# Patient Record
Sex: Female | Born: 1955 | Race: White | Hispanic: No | Marital: Married | Smoking: Former smoker
Health system: Southern US, Community
[De-identification: ages and names within clinical notes are randomized; demographics above are authoritative.]

## PROBLEM LIST (undated history)

## (undated) DIAGNOSIS — R05 Cough: Secondary | ICD-10-CM

## (undated) DIAGNOSIS — Z9889 Other specified postprocedural states: Secondary | ICD-10-CM

## (undated) DIAGNOSIS — Z8673 Personal history of transient ischemic attack (TIA), and cerebral infarction without residual deficits: Secondary | ICD-10-CM

## (undated) DIAGNOSIS — I639 Cerebral infarction, unspecified: Secondary | ICD-10-CM

## (undated) DIAGNOSIS — F40240 Claustrophobia: Secondary | ICD-10-CM

## (undated) DIAGNOSIS — Q211 Atrial septal defect, unspecified: Secondary | ICD-10-CM

## (undated) DIAGNOSIS — J302 Other seasonal allergic rhinitis: Secondary | ICD-10-CM

## (undated) DIAGNOSIS — F909 Attention-deficit hyperactivity disorder, unspecified type: Secondary | ICD-10-CM

## (undated) DIAGNOSIS — G43909 Migraine, unspecified, not intractable, without status migrainosus: Secondary | ICD-10-CM

## (undated) DIAGNOSIS — J45909 Unspecified asthma, uncomplicated: Secondary | ICD-10-CM

## (undated) DIAGNOSIS — J3089 Other allergic rhinitis: Secondary | ICD-10-CM

## (undated) DIAGNOSIS — Z79890 Hormone replacement therapy (postmenopausal): Secondary | ICD-10-CM

## (undated) DIAGNOSIS — F418 Other specified anxiety disorders: Secondary | ICD-10-CM

## (undated) DIAGNOSIS — E663 Overweight: Secondary | ICD-10-CM

## (undated) DIAGNOSIS — J449 Chronic obstructive pulmonary disease, unspecified: Secondary | ICD-10-CM

## (undated) DIAGNOSIS — Z87891 Personal history of nicotine dependence: Secondary | ICD-10-CM

## (undated) DIAGNOSIS — J209 Acute bronchitis, unspecified: Secondary | ICD-10-CM

## (undated) DIAGNOSIS — R112 Nausea with vomiting, unspecified: Secondary | ICD-10-CM

## (undated) DIAGNOSIS — G4489 Other headache syndrome: Secondary | ICD-10-CM

## (undated) DIAGNOSIS — R269 Unspecified abnormalities of gait and mobility: Secondary | ICD-10-CM

## (undated) DIAGNOSIS — J069 Acute upper respiratory infection, unspecified: Secondary | ICD-10-CM

## (undated) DIAGNOSIS — I69398 Other sequelae of cerebral infarction: Secondary | ICD-10-CM

## (undated) DIAGNOSIS — Z87440 Personal history of urinary (tract) infections: Secondary | ICD-10-CM

## (undated) DIAGNOSIS — J440 Chronic obstructive pulmonary disease with acute lower respiratory infection: Secondary | ICD-10-CM

## (undated) DIAGNOSIS — R200 Anesthesia of skin: Secondary | ICD-10-CM

## (undated) DIAGNOSIS — J0100 Acute maxillary sinusitis, unspecified: Secondary | ICD-10-CM

## (undated) DIAGNOSIS — R5383 Other fatigue: Secondary | ICD-10-CM

## (undated) DIAGNOSIS — G43109 Migraine with aura, not intractable, without status migrainosus: Secondary | ICD-10-CM

## (undated) DIAGNOSIS — G2581 Restless legs syndrome: Secondary | ICD-10-CM

## (undated) HISTORY — PX: ABDOMINAL HYSTERECTOMY: SHX81

## (undated) HISTORY — DX: Cerebral infarction, unspecified: I63.9

## (undated) HISTORY — DX: Overweight: E66.3

## (undated) HISTORY — DX: Unspecified abnormalities of gait and mobility: R26.9

## (undated) HISTORY — DX: Acute bronchitis, unspecified: J20.9

## (undated) HISTORY — DX: Other specified anxiety disorders: F41.8

## (undated) HISTORY — DX: Chronic obstructive pulmonary disease, unspecified: J44.9

## (undated) HISTORY — PX: PARTIAL HYSTERECTOMY: SHX80

## (undated) HISTORY — DX: Cough: R05

## (undated) HISTORY — PX: TUBAL LIGATION: SHX77

## (undated) HISTORY — DX: Anesthesia of skin: R20.0

## (undated) HISTORY — DX: Migraine with aura, not intractable, without status migrainosus: G43.109

## (undated) HISTORY — DX: Other fatigue: R53.83

## (undated) HISTORY — DX: Other seasonal allergic rhinitis: J30.2

## (undated) HISTORY — DX: Other allergic rhinitis: J30.89

## (undated) HISTORY — DX: Other sequelae of cerebral infarction: I69.398

## (undated) HISTORY — PX: ASD REPAIR: SHX258

## (undated) HISTORY — DX: Personal history of transient ischemic attack (TIA), and cerebral infarction without residual deficits: Z86.73

## (undated) HISTORY — DX: Atrial septal defect: Q21.1

## (undated) HISTORY — DX: Personal history of nicotine dependence: Z87.891

## (undated) HISTORY — DX: Other headache syndrome: G44.89

## (undated) HISTORY — DX: Atrial septal defect, unspecified: Q21.10

## (undated) HISTORY — DX: Acute upper respiratory infection, unspecified: J06.9

## (undated) HISTORY — DX: Unspecified asthma, uncomplicated: J45.909

## (undated) HISTORY — PX: OTHER SURGICAL HISTORY: SHX169

## (undated) HISTORY — DX: Acute maxillary sinusitis, unspecified: J01.00

## (undated) HISTORY — DX: Chronic obstructive pulmonary disease with (acute) lower respiratory infection: J44.0

---

## 1997-10-01 ENCOUNTER — Ambulatory Visit (HOSPITAL_COMMUNITY): Admission: RE | Admit: 1997-10-01 | Discharge: 1997-10-01 | Payer: Self-pay | Admitting: Internal Medicine

## 1998-01-17 ENCOUNTER — Encounter: Payer: Self-pay | Admitting: Family Medicine

## 1998-01-17 ENCOUNTER — Ambulatory Visit (HOSPITAL_COMMUNITY): Admission: RE | Admit: 1998-01-17 | Discharge: 1998-01-17 | Payer: Self-pay | Admitting: Family Medicine

## 1999-01-29 ENCOUNTER — Encounter: Admission: RE | Admit: 1999-01-29 | Discharge: 1999-01-29 | Payer: Self-pay | Admitting: Family Medicine

## 1999-01-29 ENCOUNTER — Encounter: Payer: Self-pay | Admitting: Family Medicine

## 1999-02-05 ENCOUNTER — Encounter: Payer: Self-pay | Admitting: Family Medicine

## 1999-02-05 ENCOUNTER — Encounter: Admission: RE | Admit: 1999-02-05 | Discharge: 1999-02-05 | Payer: Self-pay | Admitting: Family Medicine

## 1999-04-02 HISTORY — PX: BLADDER SUSPENSION: SHX72

## 1999-05-02 ENCOUNTER — Encounter: Payer: Self-pay | Admitting: Family Medicine

## 1999-05-02 ENCOUNTER — Encounter: Admission: RE | Admit: 1999-05-02 | Discharge: 1999-05-02 | Payer: Self-pay | Admitting: Family Medicine

## 1999-09-11 ENCOUNTER — Encounter: Payer: Self-pay | Admitting: *Deleted

## 1999-09-11 ENCOUNTER — Encounter: Admission: RE | Admit: 1999-09-11 | Discharge: 1999-09-11 | Payer: Self-pay | Admitting: *Deleted

## 1999-11-22 ENCOUNTER — Encounter (INDEPENDENT_AMBULATORY_CARE_PROVIDER_SITE_OTHER): Payer: Self-pay | Admitting: Specialist

## 1999-11-22 ENCOUNTER — Inpatient Hospital Stay (HOSPITAL_COMMUNITY): Admission: RE | Admit: 1999-11-22 | Discharge: 1999-11-23 | Payer: Self-pay | Admitting: Urology

## 2002-03-19 ENCOUNTER — Encounter: Payer: Self-pay | Admitting: Family Medicine

## 2002-03-19 ENCOUNTER — Encounter: Admission: RE | Admit: 2002-03-19 | Discharge: 2002-03-19 | Payer: Self-pay | Admitting: Family Medicine

## 2002-04-03 ENCOUNTER — Encounter: Payer: Self-pay | Admitting: Emergency Medicine

## 2002-04-03 ENCOUNTER — Emergency Department (HOSPITAL_COMMUNITY): Admission: EM | Admit: 2002-04-03 | Discharge: 2002-04-03 | Payer: Self-pay

## 2003-03-02 ENCOUNTER — Ambulatory Visit (HOSPITAL_COMMUNITY): Admission: RE | Admit: 2003-03-02 | Discharge: 2003-03-02 | Payer: Self-pay | Admitting: Family Medicine

## 2003-08-02 ENCOUNTER — Emergency Department (HOSPITAL_COMMUNITY): Admission: EM | Admit: 2003-08-02 | Discharge: 2003-08-02 | Payer: Self-pay | Admitting: Family Medicine

## 2003-08-02 ENCOUNTER — Emergency Department (HOSPITAL_COMMUNITY): Admission: EM | Admit: 2003-08-02 | Discharge: 2003-08-02 | Payer: Self-pay | Admitting: Emergency Medicine

## 2003-11-09 ENCOUNTER — Emergency Department (HOSPITAL_COMMUNITY): Admission: EM | Admit: 2003-11-09 | Discharge: 2003-11-09 | Payer: Self-pay | Admitting: *Deleted

## 2004-07-16 ENCOUNTER — Emergency Department (HOSPITAL_COMMUNITY): Admission: EM | Admit: 2004-07-16 | Discharge: 2004-07-17 | Payer: Self-pay | Admitting: Emergency Medicine

## 2004-07-18 ENCOUNTER — Ambulatory Visit (HOSPITAL_COMMUNITY): Admission: RE | Admit: 2004-07-18 | Discharge: 2004-07-18 | Payer: Self-pay | Admitting: Family Medicine

## 2004-07-25 ENCOUNTER — Ambulatory Visit (HOSPITAL_COMMUNITY): Admission: RE | Admit: 2004-07-25 | Discharge: 2004-07-25 | Payer: Self-pay | Admitting: Otolaryngology

## 2004-08-06 ENCOUNTER — Encounter: Admission: RE | Admit: 2004-08-06 | Discharge: 2004-08-06 | Payer: Self-pay | Admitting: Neurological Surgery

## 2006-07-28 ENCOUNTER — Ambulatory Visit (HOSPITAL_COMMUNITY): Admission: RE | Admit: 2006-07-28 | Discharge: 2006-07-28 | Payer: Self-pay | Admitting: Obstetrics and Gynecology

## 2007-07-27 ENCOUNTER — Ambulatory Visit (HOSPITAL_COMMUNITY): Admission: RE | Admit: 2007-07-27 | Discharge: 2007-07-27 | Payer: Self-pay | Admitting: Obstetrics and Gynecology

## 2008-08-08 ENCOUNTER — Ambulatory Visit (HOSPITAL_COMMUNITY): Admission: RE | Admit: 2008-08-08 | Discharge: 2008-08-08 | Payer: Self-pay | Admitting: Obstetrics and Gynecology

## 2009-02-27 ENCOUNTER — Ambulatory Visit: Payer: Self-pay | Admitting: Internal Medicine

## 2009-02-27 DIAGNOSIS — J3089 Other allergic rhinitis: Secondary | ICD-10-CM

## 2009-02-27 DIAGNOSIS — J302 Other seasonal allergic rhinitis: Secondary | ICD-10-CM

## 2009-02-27 DIAGNOSIS — J45909 Unspecified asthma, uncomplicated: Secondary | ICD-10-CM

## 2009-02-27 HISTORY — DX: Other seasonal allergic rhinitis: J30.2

## 2009-02-27 HISTORY — DX: Unspecified asthma, uncomplicated: J45.909

## 2009-03-13 ENCOUNTER — Ambulatory Visit: Payer: Self-pay | Admitting: Internal Medicine

## 2009-03-13 DIAGNOSIS — J069 Acute upper respiratory infection, unspecified: Secondary | ICD-10-CM | POA: Insufficient documentation

## 2009-03-13 HISTORY — DX: Acute upper respiratory infection, unspecified: J06.9

## 2009-03-20 ENCOUNTER — Ambulatory Visit: Payer: Self-pay | Admitting: Internal Medicine

## 2009-03-27 ENCOUNTER — Ambulatory Visit: Payer: Self-pay | Admitting: Internal Medicine

## 2009-03-27 DIAGNOSIS — J440 Chronic obstructive pulmonary disease with acute lower respiratory infection: Secondary | ICD-10-CM

## 2009-03-27 DIAGNOSIS — J209 Acute bronchitis, unspecified: Secondary | ICD-10-CM

## 2009-03-27 HISTORY — DX: Acute bronchitis, unspecified: J20.9

## 2009-03-27 HISTORY — DX: Acute bronchitis, unspecified: J44.0

## 2009-05-09 ENCOUNTER — Encounter: Payer: Self-pay | Admitting: Internal Medicine

## 2009-05-09 ENCOUNTER — Telehealth: Payer: Self-pay | Admitting: Internal Medicine

## 2009-05-16 ENCOUNTER — Encounter: Payer: Self-pay | Admitting: Internal Medicine

## 2009-05-17 ENCOUNTER — Telehealth: Payer: Self-pay | Admitting: Internal Medicine

## 2009-05-19 ENCOUNTER — Encounter: Payer: Self-pay | Admitting: Internal Medicine

## 2009-09-11 ENCOUNTER — Ambulatory Visit (HOSPITAL_COMMUNITY): Admission: RE | Admit: 2009-09-11 | Discharge: 2009-09-11 | Payer: Self-pay | Admitting: Obstetrics and Gynecology

## 2010-03-01 ENCOUNTER — Ambulatory Visit (HOSPITAL_COMMUNITY)
Admission: RE | Admit: 2010-03-01 | Discharge: 2010-03-01 | Payer: Self-pay | Source: Home / Self Care | Admitting: Obstetrics and Gynecology

## 2010-05-01 NOTE — Medication Information (Signed)
Summary: Tax adviser   Imported By: Lehman Prom 05/19/2009 16:23:09  _____________________________________________________________________  External Attachment:    Type:   Image     Comment:   External Document

## 2010-05-01 NOTE — Medication Information (Signed)
[facility]   Imported By: Lester [place] 05/15/2009 07:25:03  _____________________________________________________________________  External Attachment:    Type:   Image     Comment:   External Document

## 2010-05-01 NOTE — Progress Notes (Signed)
Summary: prescriptions  Phone Note Call from Patient Call back at Home Phone [phone]   Caller: Patient Call For: young Summary of Call: need rx for an inhaler, also needs a substitute for singulair, insurance refuses to fill it.//[facility] [address] Initial call taken by: Darletta Moll,  May 17, 2009 3:56 PM  Follow-up for Phone Call        advised pt that she needed to call insurance to see what would be covered instead of singulair and to call back and let us know. She is requesting rx for proventil she was given a sample at last ov. Rx sent. pt aware. Carron Curie CMA  May 17, 2009 4:16 PM     New/Updated Medications: PROVENTIL HFA 108 (90 BASE) MCG/ACT AERS (ALBUTEROL SULFATE) 2 puffs up to 4 times daily as needed for SOB and wheezing Prescriptions: PROVENTIL HFA 108 (90 BASE) MCG/ACT AERS (ALBUTEROL SULFATE) 2 puffs up to 4 times daily as needed for SOB and wheezing  #1 x 3   Entered by:   Carron Curie CMA   Authorized by:   Waymon Budge MD   Signed by:   Carron Curie CMA on 05/17/2009   Method used:   [facility]        [facility]  [phone][facility] (retail)       [address]       [place], [place]  [id number]       Ph: [phone] or [phone]       Fax: [phone]   RxID:   [id number]

## 2010-05-01 NOTE — Medication Information (Signed)
Summary: [facility] Singulair[facility]  [facility]   Imported By: Sherian Rein 05/19/2009 10:27:55  _____________________________________________________________________  External Attachment:    Type:   Image     Comment:   External Document

## 2010-05-01 NOTE — Progress Notes (Signed)
Summary: refill hydromet cough syrup  Prescriptions: HYDROMET 5-1.5 MG/5ML SYRP (HYDROCODONE-HOMATROPINE) 1 teaspoon four times a day as needed  #150 ml x 0   Entered and Authorized by:   Waymon Budge MD   Signed by:   Waymon Budge MD on 05/09/2009   Method used:   Historical   RxID:   [id number]

## 2010-06-12 LAB — CBC
HCT: 39.3 % (ref 36.0–46.0)
Hemoglobin: 13.1 g/dL (ref 12.0–15.0)
MCH: 31.6 pg (ref 26.0–34.0)
MCHC: 33.4 g/dL (ref 30.0–36.0)
MCV: 94.6 fL (ref 78.0–100.0)
Platelets: 263 10*3/uL (ref 150–400)
RBC: 4.15 MIL/uL (ref 3.87–5.11)
RDW: 13.5 % (ref 11.5–15.5)
WBC: 10.3 10*3/uL (ref 4.0–10.5)

## 2010-06-12 LAB — SURGICAL PCR SCREEN
MRSA, PCR: NEGATIVE
Staphylococcus aureus: NEGATIVE
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
## 2010-08-17 NOTE — Op Note (Signed)
[facility]  Patient:    [name], [name]                     MRN: [id number] Proc. Date: 11/22/99 Adm. Date:  [id number] Disc. Date: [id number] Attending:  Monica Becton                           Operative Report  PREOPERATIVE DIAGNOSIS:  Stress urinary incontinence.  POSTOPERATIVE DIAGNOSIS:  Stress urinary incontinence.  OPERATION:  Pubovaginal sling.  Cystoscopy and insertion of a Microvasive suprapubic catheter.  SURGEON:  Claudette Laws, M.D.  DESCRIPTION OF PROCEDURE:  This patient underwent initiation of a cystocele repair and rectocele repair by Dr. Tracey Harries.  I assisted him and then entered the operative field and put in a pubovaginal sling.  Initially we broke though the urethropelvic ligament which was quite scarred.  There was a scar in recto-symphysis, recto-pubis.  With Metzenbaum scissors and blunt dissection we were able to break through, free up the bladder neck bilaterally.  We then made an incision right over the pubic bone area about 2 cm in length, dissected our way down to the rectus fascia.  Then using a Raz needle, this was passed down behind the pubic bone under fingertip control and brought out through the urethropelvic ligament.  A sling was created using [employer] donor fascia lata graft.  This was a 2.5 cm by 9.5 cm graft. It was secured on each end with a running 0 Prolene stitch.  The ends of the Prolene suture were passed through the eye of the Raz needle and brought out suprapubically bilaterally.  The sling was then situated right over the bladder neck area using the Foley balloon as the guide.  I did tack the top of the sling with a 3-0 Vicryl stitch right in the midline.  Then a cystoscopy was performed.  There was no inadvertent puncture of the bladder.  Orifices were normal.  Indigo carmine had been injected and could be seen effluxing bilaterally.  With the bladder full of irrigating solution and the  patient in Trendelenburg position, a Microvasive suprapubic catheter was placed under direct vision, percutaneously was secured to the skin with 3-0 nylon. Dr. Ambrose Mantle then completed the cystocele repair.  I tied down the suture.  We inserted a cystoscope, deflected it to approximately 30 degrees, so as to make sure the sling was not to tight.  I then tied the Prolene sutures to themselves and across the midline, again, with a finger beneath the knot to make sure we did not make to the sling to tight.  At the end, with the bladder full of solution we could express water from the bladder with suprapubic pressure.  At this point I felt that the sling was adequately placed and not to tight.  The wound was irrigated with copious amounts of bug juice.  Dr. Ambrose Mantle finished the repair and then I skin stapled the incision with skin staples.  Vaginal packing was placed and the patient was taken back to the recovery room in satisfactory condition.  The estimated blood loss was 325 cc. D:  11/22/99 TD:  11/24/99 Job: [id number]

## 2010-08-17 NOTE — H&P (Signed)
[facility]  Patient:    [name]                         MRN: [id number] Adm. Date:  11/22/99 Attending:  Malachi Pro. Ambrose Mantle, M.D.                         History and Physical  HISTORY OF PRESENT ILLNESS:  This is a 55 year old white married female para 3-0-1-3 who was admitted to the hospital for anterior and posterior vaginal repair and a sling procedure.  The A&P repair will be done by me and the sling procedure by Dr. Etta Grandchild.  This patient had a hysterectomy in 1983.  It was done vaginally for bleeding problems.  The patient states that she has had stress urinary incontinence with cough and sneeze and requires three to four pads per day.  She also feels pressure in her vagina, sometimes tissue gets in the way with intercourse. She has stress urinary incontinence and also urgency incontinence, and has been evaluated by Dr. Etta Grandchild and he feels she will improve with a urethral suspension procedure.  She does have a significant rectocele and also a smaller cystocele and repair of those will be done at the same time.  PAST MEDICAL HISTORY:  ALLERGIES:  PENICILLIN and CODEINE.  OPERATIONS:  Hysterectomy, tubal ligation, drainage of a rectovaginal septum hematoma caused by an injury on an emergency break.  ILLNESSES:  The patient has bronchitis and has also had pneumonia.  She has an anxiety disorder and is treated with Xanax 0.5 mg once or twice a day.  She has no known heart problems.  She has also had a diagnostic laparoscopy.  HABITS:  She does not drink, smokes 1 to 1-1/2 packs of cigarettes daily.  REVIEW OF SYSTEMS:  Noncontributory, except as in the present illness, and occasional migraine headache, plus a chronic cough.  FAMILY HISTORY:  Mother 68 with heart disease.  Father died at 59 of heart problems.  One half sister is living and well.  Four brothers are living, one has diabetes.  SOCIAL HISTORY:  The patient did go to ______ [employer].  She finished [employer], and has worked as a Associate Professor.  PHYSICAL EXAMINATION:  GENERAL:  Weight 170-1/4 pounds white female in no distress.  VITAL SIGNS:  Blood pressure 120/60, pulse 80.  HEENT:  Reveals no cranial abnormalities.  Extraocular movements intact.  Nose and pharynx are clear.  There are absent molars, upper and lower.  NECK:  Supple without thyromegaly.  HEART:  Normal size and sounds.  No murmurs.  LUNGS:  Clear to P&A.  BREASTS:  Soft, without masses.  ABDOMEN:  Soft and nontender.  There are no masses palpable.  Liver, spleen and kidney are not felt.  VAGINAL:  Pap smear in July 2001 was normal.  The vulva and vagina are clean. There is a third-degree rectocele and a second-degree cystocele.  The cuff is clean.  Adnexa are clear.  RECTAL:  Exam done in July 2001 and again on November 21, 1999, showed no masses, does confirm the fairly large low rectocele.  ADMITTING IMPRESSION:  Cystocele, rectocele, stress urinary incontinence.  PLAN:  The patient is admitted for anterior and posterior repair by me and sling procedure by Dr. Etta Grandchild.  The patient understands the risks of surgery and is prepared to proceed.  She has been informed of the potential for cardiac  problems, stroke, pulmonary emboli, intestinal obstruction, fistula formation, nerve injury, hemorrhage with need for re-operation and/or transfusion.  She understands and agrees to proceed. DD:  11/21/99 TD:  11/22/99 Job: [id number]

## 2010-08-17 NOTE — Op Note (Signed)
[facility]  Patient:    SUMEDHA, MUNNERLYN                     MRN: [id number] Proc. Date: 11/22/99 Adm. Date:  [id number] Disc. Date: [id number] Attending:  Monica Becton CC:         Claudette Laws, M.D.   Operative Report  PREOPERATIVE DIAGNOSES:  Cystocele, rectocele, and stress urinary incontinence.  POSTOPERATIVE DIAGNOSES:  Cystocele, rectocele, and stress urinary incontinence.  OPERATION:  Anterior and posterior vaginal repair, sling procedure.  The anterior and posterior repair done by Dr. Ambrose Mantle with the sling procedure done by Dr. Etta Grandchild.  SURGEONS:  Malachi Pro. Ambrose Mantle, M.D. and Claudette Laws, M.D.  ANESTHESIA:  General.  DESCRIPTION OF PROCEDURE:  The patient was brought to the operating room.  The abdomen, vulva, vagina, and urethra were prepped with Betadine solution.  A Foley catheter was inserted to straight drain.  The area was draped as a sterile field.  The vaginal cuff was identified.  The portion just above the cuff was cut across.  The anterior vaginal mucosa was separated from the cystocele all the way to the urethral meatus.  About 1 cm from the urethral meatus a cystocele was developed and the cystocele was obliterated with multiple interrupted sutures of 0 Vicryl.  The redundant vaginal mucosa was not cut away.  I then cut across the posterior vaginal fourchette, undermined the vaginal mucosa almost to the cuff, developed the rectocele, obliterated the rectocele with multiple interrupted sutures of 0 Vicryl.  The vaginal mucosa was cut away and then the vagina was reunited with multiple figure-of-eight sutures of 0 Vicryl and on the perineum 3-0 Vicryl.  Two rectal examinations were done to ensure that there was no suture placed in the rectum, and there was no injury to the rectum.  At this point, Dr. Etta Grandchild took over and performed the sling procedure.  After this was completed, I cut away redundant vaginal mucosa  anteriorly and reunited the vaginal mucosa in the midline with multiple interrupted figure-of-eight sutures without 0 Vicryl. There was a slight constriction of the vagina to two fingers at the very top of the vagina, but this was considered acceptable.  There was no excessive bleeding.  A 2 inch iodoform pack was placed at the end of the procedure, and the procedure was terminated.  Blood loss was estimated at 325 cc. DD:  11/22/99 TD:  11/24/99 Job: [id number]

## 2010-08-17 NOTE — Discharge Summary (Signed)
[facility]  Patient:    [name], [name]                     MRN: [id number] Adm. Date:  [id number] Disc. Date: [id number] Attending:  Monica Becton CC:         Malachi Pro. Ambrose Mantle, M.D.   Discharge Summary  HISTORY:  This is a 55 year old married lady with three children who has had progressive symptoms of stress urinary incontinence.  She was worked up in the office and found to have significant cystorectocele along with positive stress test.  Initially we tried conservative measures with exercises, drugs, and bladder antispasmodics, but she did not improve much, and we felt the best option would be surgery.  She had seen Dr. Hilbert Bible in the past, and he had evaluated her preop and felt that we should fix the rather significant cystorectocele at the time of pubovaginal sling procedure.  We explained the nature of the procedure to the patient including complications such as postop inability to urinate, failure of the sling to correct the leakage, postop wound infection, bleeding, hematoma.  She understands and agrees to the proposed surgery.  The patient basically is in good health.  She is a smoker, has chronic bronchitis, history of migraine headaches.  PERTINENT LABORATORY DATA:  Her electrolytes were normal with a BUN of 10, creatinine 1.0.  PT and PTT were normal.  Hemoglobin 14.4, hematocrit 43.0.  Chest x-ray was unremarkable with no active disease.  EKG was normal.  COURSE IN THE HOSPITAL:  The patient came in as an a.m. admission on November 22, 1999, underwent pubovaginal sling procedure along with anterior posterior repair.  Postop, she was left with some vaginal packing, Foley catheter, and a microinvasive suprapubic catheter.  She had a normal stable first postop night, and then the next morning I saw her.  She was doing well. The catheters were draining satisfactorily.  We removed the vaginal packing along with Foley catheter, and  she was sent home with the suprapubic catheter to a leg bag and instructed how to clamp and unclamp it and was sent home with a voiding diary to measure her postvoid residuals.  FOLLOWUP:  If she has any problems, she will let me know, otherwise return in one week for followup evaluation and removal of skin staples.  FINAL DIAGNOSES: 1. Severe stress urinary incontinence. 2. Cystocele and rectocele. 3. Remote history of a hysterectomy. 4. History of bronchitis. 5. History of migraine headaches.  OPERATION:  Pubovaginal sling procedure and anterior posterior repair.  COMPLICATIONS:  None.  CONDITION ON DISCHARGE:  Stable.  DISCHARGE MEDICATIONS:  To include all of her home medicines which include an inhaler and Xanax plus Cipro 250 mg b.i.d., #14, and Tylox, #25, p.r.n. pain.  DISPOSITION:  Regular diet.  Force fluids.  Limited activity.  She is to return to the office in one week for followup. DD:  11/23/99 TD:  11/24/99 Job: [id number]

## 2010-08-23 ENCOUNTER — Other Ambulatory Visit (HOSPITAL_COMMUNITY): Payer: Self-pay | Admitting: Obstetrics and Gynecology

## 2010-08-23 DIAGNOSIS — Z1231 Encounter for screening mammogram for malignant neoplasm of breast: Secondary | ICD-10-CM

## 2010-09-17 ENCOUNTER — Ambulatory Visit (HOSPITAL_COMMUNITY)
Admission: RE | Admit: 2010-09-17 | Discharge: 2010-09-17 | Disposition: A | Discharge status: Home / Self Care | Payer: BC Managed Care – PPO | Source: Ambulatory Visit | Attending: Obstetrics and Gynecology | Admitting: Obstetrics and Gynecology

## 2010-09-17 DIAGNOSIS — Z1231 Encounter for screening mammogram for malignant neoplasm of breast: Secondary | ICD-10-CM | POA: Insufficient documentation

## 2011-02-25 ENCOUNTER — Other Ambulatory Visit: Payer: Self-pay | Admitting: Obstetrics and Gynecology

## 2011-07-11 ENCOUNTER — Encounter: Payer: Self-pay | Admitting: Pulmonary Disease

## 2011-07-12 ENCOUNTER — Encounter: Payer: Self-pay | Admitting: Internal Medicine

## 2011-07-12 ENCOUNTER — Telehealth: Payer: Self-pay | Admitting: Internal Medicine

## 2011-07-12 ENCOUNTER — Ambulatory Visit (INDEPENDENT_AMBULATORY_CARE_PROVIDER_SITE_OTHER)
Admission: RE | Admit: 2011-07-12 | Discharge: 2011-07-12 | Disposition: A | Discharge status: Home / Self Care | Payer: BC Managed Care – PPO | Source: Ambulatory Visit | Attending: Internal Medicine | Admitting: Internal Medicine

## 2011-07-12 ENCOUNTER — Ambulatory Visit (INDEPENDENT_AMBULATORY_CARE_PROVIDER_SITE_OTHER): Payer: BC Managed Care – PPO | Admitting: Internal Medicine

## 2011-07-12 VITALS — BP 140/92 | HR 78 | Ht 66.0 in | Wt 182.0 lb

## 2011-07-12 DIAGNOSIS — J209 Acute bronchitis, unspecified: Secondary | ICD-10-CM

## 2011-07-12 DIAGNOSIS — J440 Chronic obstructive pulmonary disease with acute lower respiratory infection: Secondary | ICD-10-CM

## 2011-07-12 NOTE — Progress Notes (Signed)
07/12/11- 49 yoF former smoker with history of allergic rhinitis and asthma/COPD.  Husband here. LOV 03/28/11 c/o chest congestion, chest tightness/pressure, increased SOB, some wheezing qhs, prod cough with yellow mucusx 5-6 wks. Denies f/c/s now.  Caught cold 5 or 6 weeks ago with fever. Was treated with Z-Paks twice and a course of prednisone. 2 weeks ago saw Dr. Edd Fabian and treated with Cleocin. Still bothersome postnasal drainage without blowing or headache. Chest tight with some wheeze and shortness of breath. Substernal aching. Cough is becoming less productive but has had yellow to green mucus. No blood or swollen glands. Using Ventolin inhaler with little benefit. Dropped off of Symbicort.  ROS-see HPI Constitutional:   No-   weight loss, night sweats, fevers, chills, fatigue, lassitude. HEENT:   No-  headaches, difficulty swallowing, tooth/dental problems, sore throat,       No-  sneezing, itching, ear ache,  +nasal congestion, post nasal drip,  CV:  No-   chest pain, orthopnea, PND, swelling in lower extremities, anasarca, dizziness, palpitations Resp: +  shortness of breath with exertion or at rest.              +   productive cough,  No non-productive cough,  No- coughing up of blood.              +  change in color of mucus.  + wheezing.   Skin: No-   rash or lesions. GI:  No-   heartburn, indigestion, abdominal pain, nausea, vomiting, GU:  MS:  No-   joint pain or swelling.   Neuro-     nothing unusual Psych:  No- change in mood or affect. No depression or anxiety.  No memory loss.  OBJ- Physical Exam General- Alert, Oriented, Affect-appropriate, Distress- none acute Skin- rash-none, lesions- none, excoriation- none Lymphadenopathy- none Head- atraumatic            Eyes- Gross vision intact, PERRLA, conjunctivae and secretions clear            Ears- Hearing, canals-normal            Nose- Clear, no-Septal dev, mucus, polyps, erosion, perforation             Throat-  Mallampati II , mucosa cobblestoned , drainage- none, tonsils- atrophic Neck- flexible , trachea midline, no stridor , thyroid nl, carotid no bruit Chest - symmetrical excursion , unlabored           Heart/CV- RRR , no murmur , no gallop  , no rub, nl s1 s2                           - JVD- none , edema- none, stasis changes- none, varices- none           Lung- clear to P&A, wheeze- none, cough- none , dullness-none, rub- none           Chest wall-  Abd-  Br/ Gen/ Rectal- Not done, not indicated Extrem- cyanosis- none, clubbing, none, atrophy- none, strength- nl Neuro- grossly intact to observation

## 2011-07-12 NOTE — Telephone Encounter (Signed)
Spoke with pt and notified her of cxr results. She verbalized understanding and states nothing further needed.

## 2011-07-12 NOTE — Progress Notes (Signed)
Quick Note:  [id number] ______ 

## 2011-07-12 NOTE — Progress Notes (Signed)
Quick Note:  Spoke with pt and notified of results per Dr. Young. Pt verbalized understanding and denied any questions.  ______ 

## 2011-07-12 NOTE — Patient Instructions (Signed)
Order- CXR   Dx acute bronchitis  Sample Tudorza inhaler   1 puff, twice daily  Sample Qvar 40 inhaler    2 puffs, then rinse mouth twice daily

## 2011-07-18 NOTE — Assessment & Plan Note (Signed)
Probably a viral pattern exacerbation to begin with. It may be improving now. I don't see that more antibiotic is likely to help.  Plan-sample Qvar 80, Tudorza,   CXR

## 2011-09-06 ENCOUNTER — Other Ambulatory Visit (HOSPITAL_COMMUNITY): Payer: Self-pay | Admitting: Obstetrics and Gynecology

## 2011-09-06 DIAGNOSIS — Z1231 Encounter for screening mammogram for malignant neoplasm of breast: Secondary | ICD-10-CM

## 2011-09-30 ENCOUNTER — Ambulatory Visit (HOSPITAL_COMMUNITY)
Admission: RE | Admit: 2011-09-30 | Discharge: 2011-09-30 | Disposition: A | Discharge status: Home / Self Care | Payer: BC Managed Care – PPO | Source: Ambulatory Visit | Attending: Obstetrics and Gynecology | Admitting: Obstetrics and Gynecology

## 2011-09-30 DIAGNOSIS — Z1231 Encounter for screening mammogram for malignant neoplasm of breast: Secondary | ICD-10-CM | POA: Insufficient documentation

## 2012-04-13 ENCOUNTER — Other Ambulatory Visit: Payer: Self-pay | Admitting: Orthopedic Surgery

## 2012-04-24 ENCOUNTER — Encounter (HOSPITAL_COMMUNITY)
Admission: RE | Admit: 2012-04-24 | Discharge: 2012-04-24 | Disposition: A | Discharge status: Home / Self Care | Payer: BC Managed Care – PPO | Source: Ambulatory Visit | Attending: Orthopedic Surgery | Admitting: Orthopedic Surgery

## 2012-04-24 ENCOUNTER — Encounter (HOSPITAL_COMMUNITY): Payer: Self-pay | Admitting: Pharmacy Technician

## 2012-04-24 ENCOUNTER — Encounter (HOSPITAL_COMMUNITY): Payer: Self-pay

## 2012-04-24 HISTORY — DX: Attention-deficit hyperactivity disorder, unspecified type: F90.9

## 2012-04-24 HISTORY — DX: Migraine, unspecified, not intractable, without status migrainosus: G43.909

## 2012-04-24 HISTORY — DX: Claustrophobia: F40.240

## 2012-04-24 HISTORY — DX: Other specified postprocedural states: R11.2

## 2012-04-24 HISTORY — DX: Personal history of urinary (tract) infections: Z87.440

## 2012-04-24 HISTORY — DX: Hormone replacement therapy: Z79.890

## 2012-04-24 HISTORY — DX: Restless legs syndrome: G25.81

## 2012-04-24 HISTORY — DX: Other specified postprocedural states: Z98.890

## 2012-04-24 LAB — URINALYSIS, ROUTINE W REFLEX MICROSCOPIC
Glucose, UA: NEGATIVE mg/dL
Leukocytes, UA: NEGATIVE
Protein, ur: NEGATIVE mg/dL
Urobilinogen, UA: 0.2 mg/dL (ref 0.0–1.0)

## 2012-04-24 LAB — CBC WITH DIFFERENTIAL/PLATELET
Basophils Absolute: 0.1 10*3/uL (ref 0.0–0.1)
Basophils Relative: 0 % (ref 0–1)
Eosinophils Absolute: 0.4 10*3/uL (ref 0.0–0.7)
Eosinophils Relative: 4 % (ref 0–5)
Lymphs Abs: 2.7 10*3/uL (ref 0.7–4.0)
MCH: 30.8 pg (ref 26.0–34.0)
MCHC: 33.4 g/dL (ref 30.0–36.0)
MCV: 92.2 fL (ref 78.0–100.0)
Platelets: 324 10*3/uL (ref 150–400)
RDW: 12.9 % (ref 11.5–15.5)

## 2012-04-24 LAB — SURGICAL PCR SCREEN: MRSA, PCR: NEGATIVE

## 2012-04-24 LAB — COMPREHENSIVE METABOLIC PANEL
ALT: 11 U/L (ref 0–35)
Albumin: 3.8 g/dL (ref 3.5–5.2)
Calcium: 9 mg/dL (ref 8.4–10.5)
GFR calc Af Amer: 83 mL/min — ABNORMAL LOW (ref >90)
Glucose, Bld: 98 mg/dL (ref 70–99)
Sodium: 139 mEq/L (ref 135–145)
Total Protein: 7.4 g/dL (ref 6.0–8.3)

## 2012-04-24 NOTE — Pre-Procedure Instructions (Signed)
[name]  04/24/2012   Your procedure is scheduled on:  Thursday, January 30th  Report to [facility] at 0530 AM.  Call this number if you have problems the morning of surgery: [phone]   Remember:   Do not eat food or drink liquids after midnight.    Take these medicines the morning of surgery with A SIP OF WATER: inhalers, singulair   Do not wear jewelry, make-up or nail polish.  Do not wear lotions, powders, or perfumes. You may not wear deodorant.  Do not shave 48 hours prior to surgery. Men may shave face and neck.  Do not bring valuables to the hospital.  Contacts, dentures or bridgework may not be worn into surgery.  Leave suitcase in the car. After surgery it may be brought to your room.  For patients admitted to the hospital, checkout time is 11:00 AM the day of  discharge.   Patients discharged the day of surgery will not be allowed to drive  home.    Special Instructions: Shower using CHG 2 nights before surgery and the night before surgery.  If you shower the day of surgery use CHG.  Use special wash - you have one bottle of CHG for all showers.  You should use approximately 1/3 of the bottle for each shower.   Please read over the following fact sheets that you were given: Pain Booklet, Coughing and Deep Breathing, Blood Transfusion Information, MRSA Information and Surgical Site Infection Prevention

## 2012-05-05 MED ORDER — VANCOMYCIN HCL IN DEXTROSE 1-5 GM/200ML-% IV SOLN
1000.0000 mg | INTRAVENOUS | Status: DC
  Filled 2012-05-05: qty 200

## 2012-05-06 ENCOUNTER — Encounter (HOSPITAL_COMMUNITY): Payer: Self-pay | Admitting: Anesthesiology

## 2012-05-06 ENCOUNTER — Encounter (HOSPITAL_COMMUNITY): Payer: Self-pay

## 2012-05-06 ENCOUNTER — Ambulatory Visit (HOSPITAL_COMMUNITY): Payer: BC Managed Care – PPO | Admitting: Anesthesiology

## 2012-05-06 ENCOUNTER — Encounter (HOSPITAL_COMMUNITY)
Admission: RE | Disposition: A | Discharge status: Home / Self Care | Payer: Self-pay | Source: Ambulatory Visit | Attending: Orthopedic Surgery

## 2012-05-06 ENCOUNTER — Ambulatory Visit (HOSPITAL_COMMUNITY): Payer: BC Managed Care – PPO

## 2012-05-06 ENCOUNTER — Observation Stay (HOSPITAL_COMMUNITY)
Admission: RE | Admit: 2012-05-06 | Discharge: 2012-05-07 | Disposition: A | Discharge status: Home / Self Care | Payer: BC Managed Care – PPO | Source: Ambulatory Visit | Attending: Orthopedic Surgery | Admitting: Orthopedic Surgery

## 2012-05-06 DIAGNOSIS — J069 Acute upper respiratory infection, unspecified: Secondary | ICD-10-CM

## 2012-05-06 DIAGNOSIS — Z01810 Encounter for preprocedural cardiovascular examination: Secondary | ICD-10-CM | POA: Insufficient documentation

## 2012-05-06 DIAGNOSIS — F411 Generalized anxiety disorder: Secondary | ICD-10-CM | POA: Insufficient documentation

## 2012-05-06 DIAGNOSIS — J309 Allergic rhinitis, unspecified: Secondary | ICD-10-CM

## 2012-05-06 DIAGNOSIS — J449 Chronic obstructive pulmonary disease, unspecified: Secondary | ICD-10-CM | POA: Insufficient documentation

## 2012-05-06 DIAGNOSIS — J45909 Unspecified asthma, uncomplicated: Secondary | ICD-10-CM

## 2012-05-06 DIAGNOSIS — Z01818 Encounter for other preprocedural examination: Secondary | ICD-10-CM | POA: Insufficient documentation

## 2012-05-06 DIAGNOSIS — M5030 Other cervical disc degeneration, unspecified cervical region: Principal | ICD-10-CM | POA: Insufficient documentation

## 2012-05-06 DIAGNOSIS — J209 Acute bronchitis, unspecified: Secondary | ICD-10-CM

## 2012-05-06 DIAGNOSIS — J4489 Other specified chronic obstructive pulmonary disease: Secondary | ICD-10-CM | POA: Insufficient documentation

## 2012-05-06 DIAGNOSIS — F17200 Nicotine dependence, unspecified, uncomplicated: Secondary | ICD-10-CM | POA: Insufficient documentation

## 2012-05-06 DIAGNOSIS — Z01812 Encounter for preprocedural laboratory examination: Secondary | ICD-10-CM | POA: Insufficient documentation

## 2012-05-06 HISTORY — PX: ANTERIOR CERVICAL DECOMP/DISCECTOMY FUSION: SHX1161

## 2012-05-06 LAB — TYPE AND SCREEN: Antibody Screen: NEGATIVE

## 2012-05-06 SURGERY — ANTERIOR CERVICAL DECOMPRESSION/DISCECTOMY FUSION 2 LEVELS
Anesthesia: General | Site: Spine Cervical | Laterality: Bilateral | Wound class: Clean

## 2012-05-06 MED ORDER — LIDOCAINE HCL (CARDIAC) 20 MG/ML IV SOLN
INTRAVENOUS | Status: DC | PRN
  Administered 2012-05-06: 70 mg via INTRAVENOUS

## 2012-05-06 MED ORDER — SCOPOLAMINE 1 MG/3DAYS TD PT72
1.0000 | MEDICATED_PATCH | Freq: Once | TRANSDERMAL | Status: AC
  Administered 2012-05-06: 1 via TRANSDERMAL
  Filled 2012-05-06: qty 1

## 2012-05-06 MED ORDER — ACETAMINOPHEN 650 MG RE SUPP: 650.0000 mg | RECTAL | Status: DC | PRN

## 2012-05-06 MED ORDER — DOCUSATE SODIUM 100 MG PO CAPS
100.0000 mg | ORAL_CAPSULE | Freq: Two times a day (BID) | ORAL | Status: DC
  Administered 2012-05-06 – 2012-05-07 (×2): 100 mg via ORAL
  Filled 2012-05-06 (×2): qty 1

## 2012-05-06 MED ORDER — 0.9 % SODIUM CHLORIDE (POUR BTL) OPTIME
TOPICAL | Status: DC | PRN
  Administered 2012-05-06: 1000 mL

## 2012-05-06 MED ORDER — LACTATED RINGERS IV SOLN
INTRAVENOUS | Status: DC | PRN
  Administered 2012-05-06 (×2): via INTRAVENOUS

## 2012-05-06 MED ORDER — SODIUM CHLORIDE 0.9 % IV SOLN: 250.0000 mL | INTRAVENOUS | Status: DC

## 2012-05-06 MED ORDER — BISACODYL 5 MG PO TBEC: 5.0000 mg | DELAYED_RELEASE_TABLET | Freq: Every day | ORAL | Status: DC | PRN

## 2012-05-06 MED ORDER — VANCOMYCIN HCL IN DEXTROSE 1-5 GM/200ML-% IV SOLN
INTRAVENOUS | Status: AC
  Administered 2012-05-06: 1000 mg via INTRAVENOUS
  Filled 2012-05-06: qty 200

## 2012-05-06 MED ORDER — PROMETHAZINE HCL 25 MG/ML IJ SOLN: 6.2500 mg | INTRAMUSCULAR | Status: DC | PRN

## 2012-05-06 MED ORDER — ZOLPIDEM TARTRATE 5 MG PO TABS: 5.0000 mg | ORAL_TABLET | Freq: Every evening | ORAL | Status: DC | PRN

## 2012-05-06 MED ORDER — PHENOL 1.4 % MT LIQD: 1.0000 | OROMUCOSAL | Status: DC | PRN

## 2012-05-06 MED ORDER — THROMBIN 20000 UNITS EX SOLR
CUTANEOUS | Status: AC
  Filled 2012-05-06: qty 20000

## 2012-05-06 MED ORDER — SODIUM CHLORIDE 0.9 % IV SOLN
INTRAVENOUS | Status: DC | PRN
  Administered 2012-05-06: 14:00:00 via INTRAVENOUS

## 2012-05-06 MED ORDER — ALUM & MAG HYDROXIDE-SIMETH 200-200-20 MG/5ML PO SUSP: 30.0000 mL | Freq: Four times a day (QID) | ORAL | Status: DC | PRN

## 2012-05-06 MED ORDER — FLEET ENEMA 7-19 GM/118ML RE ENEM: 1.0000 | ENEMA | Freq: Once | RECTAL | Status: AC | PRN

## 2012-05-06 MED ORDER — BUPIVACAINE-EPINEPHRINE 0.25% -1:200000 IJ SOLN
INTRAMUSCULAR | Status: DC | PRN
  Administered 2012-05-06: 1 mL

## 2012-05-06 MED ORDER — NEOSTIGMINE METHYLSULFATE 1 MG/ML IJ SOLN
INTRAMUSCULAR | Status: DC | PRN
  Administered 2012-05-06: 3 mg via INTRAVENOUS
  Administered 2012-05-06: 1 mg via INTRAVENOUS

## 2012-05-06 MED ORDER — OXYCODONE-ACETAMINOPHEN 5-325 MG PO TABS: 1.0000 | ORAL_TABLET | ORAL | Status: DC | PRN

## 2012-05-06 MED ORDER — DIAZEPAM 5 MG PO TABS
5.0000 mg | ORAL_TABLET | Freq: Four times a day (QID) | ORAL | Status: DC | PRN
  Administered 2012-05-07: 5 mg via ORAL
  Filled 2012-05-06: qty 1

## 2012-05-06 MED ORDER — CEFAZOLIN SODIUM 1-5 GM-% IV SOLN
1.0000 g | Freq: Three times a day (TID) | INTRAVENOUS | Status: AC
  Administered 2012-05-06 – 2012-05-07 (×2): 1 g via INTRAVENOUS
  Filled 2012-05-06 (×2): qty 50

## 2012-05-06 MED ORDER — THROMBIN 20000 UNITS EX SOLR
CUTANEOUS | Status: DC | PRN
  Administered 2012-05-06: 16:00:00 via TOPICAL

## 2012-05-06 MED ORDER — ONDANSETRON HCL 4 MG/2ML IJ SOLN
INTRAMUSCULAR | Status: DC | PRN
  Administered 2012-05-06: 4 mg via INTRAVENOUS

## 2012-05-06 MED ORDER — ACETAMINOPHEN 325 MG PO TABS
650.0000 mg | ORAL_TABLET | ORAL | Status: DC | PRN
  Administered 2012-05-06 – 2012-05-07 (×3): 650 mg via ORAL
  Filled 2012-05-06 (×3): qty 2

## 2012-05-06 MED ORDER — SODIUM CHLORIDE 0.9 % IJ SOLN: 3.0000 mL | INTRAMUSCULAR | Status: DC | PRN

## 2012-05-06 MED ORDER — PHENYLEPHRINE HCL 10 MG/ML IJ SOLN
INTRAMUSCULAR | Status: DC | PRN
  Administered 2012-05-06: 80 ug via INTRAVENOUS

## 2012-05-06 MED ORDER — MENTHOL 3 MG MT LOZG: 1.0000 | LOZENGE | OROMUCOSAL | Status: DC | PRN

## 2012-05-06 MED ORDER — SODIUM CHLORIDE 0.9 % IJ SOLN: 3.0000 mL | Freq: Two times a day (BID) | INTRAMUSCULAR | Status: DC

## 2012-05-06 MED ORDER — ROCURONIUM BROMIDE 100 MG/10ML IV SOLN
INTRAVENOUS | Status: DC | PRN
  Administered 2012-05-06: 20 mg via INTRAVENOUS
  Administered 2012-05-06: 50 mg via INTRAVENOUS

## 2012-05-06 MED ORDER — FENTANYL CITRATE 0.05 MG/ML IJ SOLN
25.0000 ug | INTRAMUSCULAR | Status: DC | PRN
  Administered 2012-05-06 (×3): 25 ug via INTRAVENOUS

## 2012-05-06 MED ORDER — GLYCOPYRROLATE 0.2 MG/ML IJ SOLN
INTRAMUSCULAR | Status: DC | PRN
  Administered 2012-05-06: 0.2 mg via INTRAVENOUS
  Administered 2012-05-06: 0.4 mg via INTRAVENOUS

## 2012-05-06 MED ORDER — HYDROCODONE-ACETAMINOPHEN 5-325 MG PO TABS: 1.0000 | ORAL_TABLET | ORAL | Status: DC | PRN

## 2012-05-06 MED ORDER — BUPIVACAINE-EPINEPHRINE PF 0.25-1:200000 % IJ SOLN
INTRAMUSCULAR | Status: AC
  Filled 2012-05-06: qty 30

## 2012-05-06 MED ORDER — FENTANYL CITRATE 0.05 MG/ML IJ SOLN
INTRAMUSCULAR | Status: AC
  Filled 2012-05-06: qty 2

## 2012-05-06 MED ORDER — ARTIFICIAL TEARS OP OINT
TOPICAL_OINTMENT | OPHTHALMIC | Status: DC | PRN
  Administered 2012-05-06: 1 via OPHTHALMIC

## 2012-05-06 MED ORDER — FENTANYL CITRATE 0.05 MG/ML IJ SOLN: 50.0000 ug | Freq: Once | INTRAMUSCULAR | Status: DC

## 2012-05-06 MED ORDER — ONDANSETRON HCL 4 MG/2ML IJ SOLN: 4.0000 mg | INTRAMUSCULAR | Status: DC | PRN

## 2012-05-06 MED ORDER — FENTANYL CITRATE 0.05 MG/ML IJ SOLN
INTRAMUSCULAR | Status: DC | PRN
  Administered 2012-05-06: 100 ug via INTRAVENOUS
  Administered 2012-05-06: 50 ug via INTRAVENOUS
  Administered 2012-05-06: 100 ug via INTRAVENOUS
  Administered 2012-05-06: 50 ug via INTRAVENOUS
  Administered 2012-05-06: 100 ug via INTRAVENOUS

## 2012-05-06 MED ORDER — POVIDONE-IODINE 7.5 % EX SOLN
Freq: Once | CUTANEOUS | Status: DC
  Filled 2012-05-06: qty 118

## 2012-05-06 MED ORDER — LACTATED RINGERS IV SOLN
INTRAVENOUS | Status: DC
  Administered 2012-05-06: 13:00:00 via INTRAVENOUS

## 2012-05-06 MED ORDER — MORPHINE SULFATE 2 MG/ML IJ SOLN: 1.0000 mg | INTRAMUSCULAR | Status: DC | PRN

## 2012-05-06 MED ORDER — SENNOSIDES-DOCUSATE SODIUM 8.6-50 MG PO TABS: 1.0000 | ORAL_TABLET | Freq: Every evening | ORAL | Status: DC | PRN

## 2012-05-06 MED ORDER — MIDAZOLAM HCL 5 MG/5ML IJ SOLN
INTRAMUSCULAR | Status: DC | PRN
  Administered 2012-05-06 (×2): 1 mg via INTRAVENOUS

## 2012-05-06 MED ORDER — PROPOFOL 10 MG/ML IV BOLUS
INTRAVENOUS | Status: DC | PRN
  Administered 2012-05-06: 180 mg via INTRAVENOUS

## 2012-05-06 MED ORDER — MIDAZOLAM HCL 2 MG/2ML IJ SOLN: 1.0000 mg | INTRAMUSCULAR | Status: DC | PRN

## 2012-05-06 SURGICAL SUPPLY — 80 items
APL SKNCLS STERI-STRIP NONHPOA (GAUZE/BANDAGES/DRESSINGS) ×1
BENZOIN TINCTURE PRP APPL 2/3 (GAUZE/BANDAGES/DRESSINGS) ×2 IMPLANT
BIT DRILL NEURO 2X3.1 SFT TUCH (MISCELLANEOUS) ×1 IMPLANT
BIT DRILL SKYLINE 12MM (BIT) IMPLANT
BLADE LONG MED 31X9 (MISCELLANEOUS) IMPLANT
BLADE SURG 15 STRL LF DISP TIS (BLADE) ×1 IMPLANT
BLADE SURG 15 STRL SS (BLADE) ×2
BLADE SURG ROTATE 9660 (MISCELLANEOUS) ×2 IMPLANT
BUR MATCHSTICK NEURO 3.0 LAGG (BURR) ×3 IMPLANT
CARTRIDGE OIL MAESTRO DRILL (MISCELLANEOUS) ×1 IMPLANT
CLOTH BEACON ORANGE TIMEOUT ST (SAFETY) ×2 IMPLANT
CLSR STERI-STRIP ANTIMIC 1/2X4 (GAUZE/BANDAGES/DRESSINGS) ×1 IMPLANT
COLLAR CERV LO CONTOUR FIRM DE (SOFTGOODS) IMPLANT
CORDS BIPOLAR (ELECTRODE) ×2 IMPLANT
COVER SURGICAL LIGHT HANDLE (MISCELLANEOUS) ×2 IMPLANT
CRADLE DONUT ADULT HEAD (MISCELLANEOUS) ×2 IMPLANT
DEVICE ENDSKLTN MED 6 7MM (Orthopedic Implant) IMPLANT
DIFFUSER DRILL AIR PNEUMATIC (MISCELLANEOUS) ×2 IMPLANT
DRAIN JACKSON RD 7FR 3/32 (WOUND CARE) IMPLANT
DRAPE C-ARM 42X72 X-RAY (DRAPES) ×2 IMPLANT
DRAPE POUCH INSTRU U-SHP 10X18 (DRAPES) ×2 IMPLANT
DRAPE SURG 17X23 STRL (DRAPES) ×6 IMPLANT
DRILL BIT SKYLINE 12MM (BIT) ×2
DRILL NEURO 2X3.1 SOFT TOUCH (MISCELLANEOUS) ×2
DURAPREP 26ML APPLICATOR (WOUND CARE) ×2 IMPLANT
ELECT COATED BLADE 2.86 ST (ELECTRODE) ×2 IMPLANT
ELECT REM PT RETURN 9FT ADLT (ELECTROSURGICAL) ×2
ELECTRODE REM PT RTRN 9FT ADLT (ELECTROSURGICAL) ×1 IMPLANT
ENDOSKELETON MED 6 7MM (Orthopedic Implant) ×2 IMPLANT
EVACUATOR SILICONE 100CC (DRAIN) IMPLANT
GAUZE SPONGE 4X4 16PLY XRAY LF (GAUZE/BANDAGES/DRESSINGS) ×2 IMPLANT
GLOVE BIO SURGEON STRL SZ7 (GLOVE) ×2 IMPLANT
GLOVE BIO SURGEON STRL SZ8 (GLOVE) ×2 IMPLANT
GLOVE BIOGEL PI IND STRL 8 (GLOVE) ×1 IMPLANT
GLOVE BIOGEL PI INDICATOR 8 (GLOVE) ×1
GOWN STRL NON-REIN LRG LVL3 (GOWN DISPOSABLE) ×2 IMPLANT
GOWN STRL REIN XL XLG (GOWN DISPOSABLE) ×2 IMPLANT
INTERLOCK LRDTC CRVCL VBR 6MM (Bone Implant) IMPLANT
IV CATH 14GX2 1/4 (CATHETERS) ×2 IMPLANT
KIT BASIN OR (CUSTOM PROCEDURE TRAY) ×2 IMPLANT
KIT ROOM TURNOVER OR (KITS) ×2 IMPLANT
LORDOTIC CERVICAL VBR 6MM SM (Bone Implant) ×2 IMPLANT
MANIFOLD NEPTUNE II (INSTRUMENTS) ×2 IMPLANT
NDL SPNL 20GX3.5 QUINCKE YW (NEEDLE) ×1 IMPLANT
NEEDLE 27GAX1X1/2 (NEEDLE) ×2 IMPLANT
NEEDLE SPNL 20GX3.5 QUINCKE YW (NEEDLE) ×2 IMPLANT
NS IRRIG 1000ML POUR BTL (IV SOLUTION) ×2 IMPLANT
OIL CARTRIDGE MAESTRO DRILL (MISCELLANEOUS) ×2
PACK ORTHO CERVICAL (CUSTOM PROCEDURE TRAY) ×2 IMPLANT
PAD ARMBOARD 7.5X6 YLW CONV (MISCELLANEOUS) ×4 IMPLANT
PATTIES SURGICAL .5 X.5 (GAUZE/BANDAGES/DRESSINGS) IMPLANT
PATTIES SURGICAL .5 X1 (DISPOSABLE) ×1 IMPLANT
PIN DISTRACTION 14 (PIN) ×2 IMPLANT
PIN DISTRACTION 14MM (PIN) IMPLANT
PIN TEMP SKYLINE THREADED (PIN) ×1 IMPLANT
PLATE SKYLINE TWO LEVEL 28MM (Plate) ×1 IMPLANT
PUTTY BONE DBX 2.5 MIS (Bone Implant) ×1 IMPLANT
SCREW SKYLINE VAR OS 14MM (Screw) ×1 IMPLANT
SCREW VAR SELF TAP SKYLINE 14M (Screw) ×6 IMPLANT
SLEEVE SURGEON STRL (DRAPES) ×1 IMPLANT
SPONGE GAUZE 4X4 12PLY (GAUZE/BANDAGES/DRESSINGS) ×2 IMPLANT
SPONGE INTESTINAL PEANUT (DISPOSABLE) ×2 IMPLANT
SPONGE SURGIFOAM ABS GEL 100 (HEMOSTASIS) ×2 IMPLANT
STRIP CLOSURE SKIN 1/2X4 (GAUZE/BANDAGES/DRESSINGS) ×2 IMPLANT
SURGIFLO TRUKIT (HEMOSTASIS) IMPLANT
SUT MNCRL AB 4-0 PS2 18 (SUTURE) ×2 IMPLANT
SUT SILK 4 0 (SUTURE)
SUT SILK 4-0 18XBRD TIE 12 (SUTURE) IMPLANT
SUT VIC AB 1 CT1 27 (SUTURE)
SUT VIC AB 1 CT1 27XBRD ANBCTR (SUTURE) ×1 IMPLANT
SUT VIC AB 2-0 CT2 18 VCP726D (SUTURE) ×2 IMPLANT
SYR BULB IRRIGATION 50ML (SYRINGE) ×2 IMPLANT
SYR CONTROL 10ML LL (SYRINGE) ×4 IMPLANT
TAPE CLOTH 4X10 WHT NS (GAUZE/BANDAGES/DRESSINGS) ×2 IMPLANT
TAPE CLOTH SURG 4X10 WHT LF (GAUZE/BANDAGES/DRESSINGS) ×1 IMPLANT
TAPE UMBILICAL COTTON 1/8X30 (MISCELLANEOUS) ×2 IMPLANT
TOWEL OR 17X24 6PK STRL BLUE (TOWEL DISPOSABLE) ×2 IMPLANT
TOWEL OR 17X26 10 PK STRL BLUE (TOWEL DISPOSABLE) ×2 IMPLANT
WATER STERILE IRR 1000ML POUR (IV SOLUTION) ×2 IMPLANT
YANKAUER SUCT BULB TIP NO VENT (SUCTIONS) ×2 IMPLANT

## 2012-05-06 NOTE — OR Nursing (Signed)
Arrived 5n after sedation with fentanyl priior to elevator/ patient traversed via elevator with minimal/slight hesitancy/ emotional support by both RN's tarvelling with pt

## 2012-05-06 NOTE — Transfer of Care (Signed)
Immediate Anesthesia Transfer of Care Note  Patient: [name]  Procedure(s) Performed: Procedure(s) (LRB) with comments: ANTERIOR CERVICAL DECOMPRESSION/DISCECTOMY FUSION 2 LEVELS (Bilateral) - Anterior cervical decompression fusion, cervical 5-6, cervical 6-7 with instrumentation and allograft.  Patient Location: PACU  Anesthesia Type:General  Level of Consciousness: awake, alert  and oriented  Airway & Oxygen Therapy: Patient Spontanous Breathing and Patient connected to nasal cannula oxygen  Post-op Assessment: Report given to PACU RN and Post -op Vital signs reviewed and stable  Post vital signs: Reviewed and stable  Complications: No apparent anesthesia complications

## 2012-05-06 NOTE — Anesthesia Preprocedure Evaluation (Addendum)
Anesthesia Evaluation  Patient identified by MRN, date of birth, ID band Patient awake    Reviewed: Allergy & Precautions, H&P , NPO status , Patient's Chart, lab work & pertinent test results, reviewed documented beta blocker date and time   History of Anesthesia Complications (+) PONV  Airway Mallampati: I TM Distance: >3 FB Neck ROM: Full    Dental  (+) Teeth Intact, Caps and Dental Advisory Given   Pulmonary asthma , COPD COPD inhaler, former smoker,  breath sounds clear to auscultation        Cardiovascular Rhythm:Regular Rate:Normal     Neuro/Psych  Headaches, Anxiety    GI/Hepatic   Endo/Other    Renal/GU      Musculoskeletal   Abdominal   Peds  Hematology   Anesthesia Other Findings Upper front crowns.  Small aperture to mouth.  Reproductive/Obstetrics                         Anesthesia Physical Anesthesia Plan  ASA: II  Anesthesia Plan: General   Post-op Pain Management:    Induction: Intravenous  Airway Management Planned: Oral ETT  Additional Equipment:   Intra-op Plan:   Post-operative Plan: Extubation in OR  Informed Consent: I have reviewed the patients History and Physical, chart, labs and discussed the procedure including the risks, benefits and alternatives for the proposed anesthesia with the patient or authorized representative who has indicated his/her understanding and acceptance.     Plan Discussed with: CRNA and Surgeon  Anesthesia Plan Comments:         Anesthesia Quick Evaluation

## 2012-05-06 NOTE — OR Nursing (Signed)
02 sat 97% on 2lnc and rr 17/ moves all exts strongly and independently / tolerating po'flds

## 2012-05-06 NOTE — Anesthesia Procedure Notes (Signed)
Procedure Name: Intubation Date/Time: 05/06/2012 2:10 PM Performed by: Darcey Nora B Pre-anesthesia Checklist: Patient identified, Emergency Drugs available, Suction available and Patient being monitored Patient Re-evaluated:Patient Re-evaluated prior to inductionOxygen Delivery Method: Circle system utilized Preoxygenation: Pre-oxygenation with 100% oxygen Intubation Type: IV induction Ventilation: Mask ventilation without difficulty Grade View: Grade II Tube type: Oral Tube size: 7.5 mm Number of attempts: 1 Airway Equipment and Method: Stylet and Video-laryngoscopy Placement Confirmation: ETT inserted through vocal cords under direct vision,  positive ETCO2 and breath sounds checked- equal and bilateral Secured at: 21 (cm at teeth) cm Tube secured with: Tape Dental Injury: Teeth and Oropharynx as per pre-operative assessment

## 2012-05-06 NOTE — H&P (Signed)
PREOPERATIVE H&P  Chief Complaint: bilateral arm pain  HPI: [name] is a 57 y.o. female who presents with bilateral arm pain  Past Medical History  Diagnosis Date  . Asthma   . ALLERGIC RHINITIS   . PONV (postoperative nausea and vomiting)     Pt states Zofran does not relieve sx; prefers Phenergan  . Post-menopause on HRT (hormone replacement therapy)   . Restless leg   . Migraine headache     since childhood  . ADHD (attention deficit hyperactivity disorder)   . History of frequent urinary tract infections   . Claustrophobia     unable to ride in elavator;leave room door open   Past Surgical History  Procedure Date  . Sinus surgery repair   . Tubel ligation   . Partial hysterectomy   . Abdominal hysterectomy   . Tubal ligation   . Bladder suspension 2001   History   Social History  . Marital Status: Married    Spouse Name: N/A    Number of Children: 3  . Years of Education: N/A   Occupational History  . Hair Stylist    Social History Main Topics  . Smoking status: Former Smoker -- 2.0 packs/day for 25 years    Types: Cigarettes    Quit date: 04/02/2003  . Smokeless tobacco: Never Used  . Alcohol Use: No  . Drug Use: No  . Sexually Active: Not on file   Other Topics Concern  . Not on file   Social History Narrative  . No narrative on file   Family History  Problem Relation Age of Onset  . Lung cancer Brother   . Asthma Mother   . Allergies Mother   . Heart disease Father     cause of death   Allergies  Allergen Reactions  . Aspirin Swelling  . Cefuroxime Swelling  . Codeine Nausea Only  . Hydrocodone Nausea Only  . Penicillins Swelling   Prior to Admission medications   Medication Sig Start Date End Date Taking? Authorizing Provider  Aclidinium Bromide (TUDORZA PRESSAIR) 400 MCG/ACT AEPB Inhale 2 puffs into the lungs 2 (two) times daily.    Historical Provider, MD  albuterol (PROVENTIL HFA) 108 (90 BASE) MCG/ACT inhaler Inhale 2  puffs into the lungs 2 (two) times daily as needed. SOB and wheezing    Historical Provider, MD  clonazePAM (KLONOPIN) 0.5 MG tablet Take 0.5-1 mg by mouth at bedtime.    Historical Provider, MD  ketorolac (TORADOL) 10 MG tablet Take 10 mg by mouth every 6 (six) hours.    Historical Provider, MD  methylphenidate (CONCERTA) 18 MG CR tablet Take 18 mg by mouth 3 (three) times daily.    Historical Provider, MD  montelukast (SINGULAIR) 10 MG tablet Take 10 mg by mouth daily.    Historical Provider, MD  phentermine (ADIPEX-P) 37.5 MG tablet Take 37.5 mg by mouth daily before breakfast.    Historical Provider, MD  PRESCRIPTION MEDICATION Take 1 capsule by mouth daily. Estradiol/Progesterone 0.5mg /100mg  Capsule (Compounded by pharmacy)    Historical Provider, MD  topiramate (TOPAMAX) 100 MG tablet Take 100 mg by mouth daily.    Historical Provider, MD     All other systems have been reviewed and were otherwise negative with the exception of those mentioned in the HPI and as above.  Physical Exam: There were no vitals filed for this visit.  General: Alert, no acute distress Cardiovascular: No pedal edema Respiratory: No cyanosis, no use of accessory musculature  GI: No organomegaly, abdomen is soft and non-tender Skin: No lesions in the area of chief complaint Neurologic: Sensation intact distally Psychiatric: Patient is competent for consent with normal mood and affect Lymphatic: No axillary or cervical lymphadenopathy   Assessment/Plan: Bilateral arm pain and neck pain Plan for Procedure(s): ANTERIOR CERVICAL DECOMPRESSION/DISCECTOMY FUSION 2 LEVELS   Emilee Hero, MD 05/06/2012 8:20 AM

## 2012-05-06 NOTE — Preoperative (Signed)
Beta Blockers   Reason not to administer Beta Blockers:Not Applicable 

## 2012-05-06 NOTE — Progress Notes (Signed)
Pt spouse states that Dr. And staff need to know that patienit cannot travel on elevator so after recovery she will not be able to travel on elevator to her room unless she is sedated [name].

## 2012-05-06 NOTE — Anesthesia Postprocedure Evaluation (Signed)
Anesthesia Post Note  Patient: [name]  Procedure(s) Performed: Procedure(s) (LRB): ANTERIOR CERVICAL DECOMPRESSION/DISCECTOMY FUSION 2 LEVELS (Bilateral)  Anesthesia type: General  Patient location: PACU  Post pain: Pain level controlled and Adequate analgesia  Post assessment: Post-op Vital signs reviewed, Patient's Cardiovascular Status Stable, Respiratory Function Stable, Patent Airway and Pain level controlled  Last Vitals:  Filed Vitals:   05/06/12 1802  BP: 146/84  Pulse: 101  Temp:   Resp: 13    Post vital signs: Reviewed and stable  Level of consciousness: awake, alert  and oriented  Complications: No apparent anesthesia complications

## 2012-05-07 MED ORDER — PNEUMOCOCCAL VAC POLYVALENT 25 MCG/0.5ML IJ INJ: 0.5000 mL | INJECTION | INTRAMUSCULAR | Status: DC

## 2012-05-07 NOTE — Progress Notes (Signed)
Patient reports minimal neck pain.  Took tylenol only. Resolved shoulder pain.  BP 146/76  Pulse 86  Temp 97.6 F (36.4 C) (Oral)  Resp 20  SpO2 97%  NVI Collar well-applied  S/p 5-7 acdf  - philly collar to bedside for showering - d/c home - f/u 2 weeks

## 2012-05-07 NOTE — Progress Notes (Signed)
Orthopedic Tech Progress Note Patient Details:  FIDENCIA MCCLOUD 1955/11/08 [id number] As noted in order comments, collar was applied on in OR Patient ID: Dalene Seltzer, female   DOB: 1956-01-25, 57 y.o.   MRN: [id number]   Orie Rout 05/07/2012, 8:44 AM

## 2012-05-07 NOTE — Op Note (Signed)
NAMERIO, TABER NO.:  [id number]  MEDICAL RECORD NO.:  [id number]  LOCATION:  5N19C                        FACILITY:  [facility]  PHYSICIAN:  Estill Bamberg, MD      DATE OF BIRTH:  08/26/1955  DATE OF PROCEDURE: 05/06/2012                              OPERATIVE REPORT   PREOPERATIVE DIAGNOSES: 1. Bilateral cervical radiculopathy. 2. C5-6, C6-7 degenerative disk disease.  POSTOPERATIVE DIAGNOSES: 1. Bilateral cervical radiculopathy. 2. C5-6, C6-7 degenerative disk disease.  PROCEDURE: 1. C5-6, C6-7 anterior cervical decompression and fusion. 2. Placement of anterior instrumentation, C5-C7. 3. Insertion of interbody device x2 (Titan interbody cages x2). 4. Use of local morselized allograft (DBX mix). 5. Intraoperative use of fluoroscopy.  SURGEON:  Estill Bamberg, MD  ASSISTANT:  Jason Coop, PA-C.  ANESTHESIA:  General endotracheal anesthesia.  COMPLICATIONS:  None.  DISPOSITION:  Stable.  ESTIMATED BLOOD LOSS:  Minimal.  INDICATIONS FOR PROCEDURE:  Briefly, Ms. [name] is a pleasant 57 year old female, who I did evaluate on April 10, 2012, with severe pain in her bilateral arms.  The patient was previously evaluated by Dr. Modesto Charon and by Dr. Renae Fickle and the patient was ultimately referred to me, as it was felt that the patient will be a candidate for surgical intervention.  At that time.  The patient did complain of bilateral radicular type pain.  She did have 6 weeks of physical therapy.  Of particular note, the patient did have an MRI notable for significant degenerative disk disease at C5- 6 and C6-7 and there was varying degrees of neuroforaminal stenosis at each of these levels as well.  There was minimal deflection of the spinal cord as well at both levels.  Given the findings on the patient's MRI and her clinical constellation of symptoms, we did have a discussion regarding going forward with surgical intervention.  Of particular note, the  patient did have an epidural injection and per the patient, this did alleviate 80% to 90% of her pain.  I therefore did have a discussion regarding going forward with a two-level decompressive procedure to address the spinal cord compression and the bilateral neuroforaminal stenosis.  Of note, the patient did have various musculoskeletal complaints related to pain in her neck and shoulder as well.  I was very clear and explained to the patient that this surgery would address any pain related to her compression of her nerves and the surgery itself would not be expecting to relieve any additional pain unrelated to cervical radiculopathy.  The patient understood the additional risks of surgery and did elect to proceed.  OPERATIVE DETAILS:  On May 06, 2012, the patient was brought to surgery and general endotracheal anesthesia was administered.  The patient was placed supine on the hospital bed.  All bony prominences were meticulously padded.  The neck was placed in a gentle degree of extension.  The arms were secured to the patient's sides.  All bony prominences were meticulously padded.  Time-out procedure was performed. I then made a left-sided transverse incision overlying the C6 vertebral body.  The platysma was incised and the anterior cervical spine was readily identified.  The carotid artery was retracted laterally and the strap  muscles and esophagus were retracted medially.  I did confirm the appropriate operative level using intraoperative fluoroscopy.  I then subperiosteally exposed the vertebral bodies of C5, C6, and C7.  A self- retaining Shadow-Line retractor was placed, centered over the C6-7 interspace.  Significant osteophytes were identified and were removed using a rongeur.  I then performed annulotomy using a knife and I did use a series of curettes and pituitary rongeurs in addition to Kerrison punches to perform a thorough and complete diskectomy.  The  posterior longitudinal ligament was identified and entered.  I then continued the neuroforaminal compression on the right and the left sides using a #2 Kerrison.  Thorough neuroforaminal decompression was confirmed using a nerve hook on both the right and the left sides.  I then prepared the endplates and placed in appropriately sized Titan interbody cage.  The cage was packed with DBX mix.  I was very pleased with the final press- fit.  I then turned my attention towards the C5-6 interspace and a diskectomy was performed in the manner previously described.  I again confirmed a thorough neuroforaminal decompression on the right and the left sides using a nerve hook.  There was no residual compression of the exiting nerves at the termination of the decompression.  I again prepared the endplates and again chose an appropriately-sized Titan interbody cage.  The cage was packed with DBX mix and tamped into position in the usual fashion.  Again, I was very pleased with the final press-fit of the implant.  I then chose an appropriately sized Shadow- Line plate.  This was placed over the anterior cervical spine.  I then placed 14 mm screws in each vertebral body for a total of 6 screws.  I was very pleased with the final purchase of each of the screws.  The CAM locking mechanism was then used to secure the screws into position.  The wound was then copiously irrigated.  I then obtained AP and lateral fluoroscopic views and I was very pleased with the final appearance of the construct.  I then explored the wound for any other bleeding and there was none.  The platysma was then closed using 2-0 Vicryl and the skin was closed using 4-0 Monocryl.  Benzoin and Steri-Strips were then applied followed by sterile dressing.  Hard collar was then applied and at this point, the patient was awoken from general endotracheal anesthesia and transferred to recovery in stable condition.  Of note, Jason Coop  was my assistant throughout the procedure and aided in essential retraction and suctioning required throughout the surgery.     Estill Bamberg, MD     MD/MEDQ  D:  05/06/2012  T:  05/07/2012  Job:  [id number]  cc:   Windle Guard, M.D. Milly Jakob, MD

## 2012-05-07 NOTE — Discharge Summary (Signed)
Patient ID: [name] MRN: [id number] DOB/AGE: 02-Sep-1955 57 y.o.  Admit date: 05/06/2012 Discharge date: 05/07/2012  Admission Diagnoses: cervical radiculopathy   Discharge Diagnoses: Improved s/p C5-C7 ACDF  Past Medical History  Diagnosis Date  . Asthma   . ALLERGIC RHINITIS   . PONV (postoperative nausea and vomiting)     Pt states Zofran does not relieve sx; prefers Phenergan  . Post-menopause on HRT (hormone replacement therapy)   . Restless leg   . Migraine headache     since childhood  . ADHD (attention deficit hyperactivity disorder)   . History of frequent urinary tract infections   . Claustrophobia     unable to ride in elavator;leave room door open    Surgeries: Procedure(s): ANTERIOR CERVICAL DECOMPRESSION/DISCECTOMY FUSION 2 LEVELS C5-C7 on 05/06/2012   Discharged Condition: Improved  Hospital Course: [name] is an 57 y.o. female who was admitted 05/06/2012 for operative treatment of Cervical radiculopathy. Patient has severe unremitting pain that affects sleep, daily activities, and work/hobbies. After pre-op clearance the patient was taken to the operating room on 05/06/2012 and underwent  Procedure(s): ANTERIOR CERVICAL DECOMPRESSION/DISCECTOMY FUSION 2 LEVELS C5-C7.    Patient was given perioperative antibiotics: Anti-infectives     Start     Dose/Rate Route Frequency Ordered Stop   05/06/12 1930   ceFAZolin (ANCEF) IVPB 1 g/50 mL premix        1 g 100 mL/hr over 30 Minutes Intravenous Every 8 hours 05/06/12 1923 05/07/12 0434   05/06/12 1356   vancomycin (VANCOCIN) 1 GM/200ML IVPB     Comments: JAMES, KAREN: cabinet override         05/06/12 1356 05/06/12 1415   05/06/12 0600   vancomycin (VANCOCIN) IVPB 1000 mg/200 mL premix  Status:  Discontinued        1,000 mg 200 mL/hr over 60 Minutes Intravenous On call to O.R. 05/05/12 1610 05/06/12 1835           Patient was given sequential compression devices, early ambulation, and chemoprophylaxis  to prevent DVT.  Patient benefited maximally from hospital stay and there were no complications.    Recent vital signs: Patient Vitals for the past 24 hrs:  BP Temp Temp src Pulse Resp SpO2  05/07/12 0528 146/76 mmHg 97.6 F (36.4 C) Oral 86  20  97 %  05/07/12 0219 104/64 mmHg 98.2 F (36.8 C) - 74  20  97 %  05/06/12 2100 146/76 mmHg 97.6 F (36.4 C) Oral 86  18  97 %  05/06/12 1853 151/82 mmHg 97.5 F (36.4 C) - 89  17  95 %  05/06/12 1840 - 97.3 F (36.3 C) - - - -  05/06/12 1802 146/84 mmHg - - 101  13  -  05/06/12 1738 162/80 mmHg 98 F (36.7 C) - 90  14  97 %  05/06/12 1237 139/86 mmHg 98.1 F (36.7 C) Oral 73  18  97 %    Discharge Medications:     Medication List     As of 05/07/2012 12:28 PM    TAKE these medications         clonazePAM 0.5 MG tablet   Commonly known as: KLONOPIN   Take 0.5-1 mg by mouth at bedtime. Restless legs      ketorolac 10 MG tablet   Commonly known as: TORADOL   Take 10 mg by mouth every 6 (six) hours as needed. migraines      methylphenidate 18 MG CR tablet  Commonly known as: CONCERTA   Take 18 mg by mouth 3 (three) times daily.      montelukast 10 MG tablet   Commonly known as: SINGULAIR   Take 10 mg by mouth daily.      phentermine 37.5 MG tablet   Commonly known as: ADIPEX-P   Take 37.5 mg by mouth daily before breakfast.      PRESCRIPTION MEDICATION   Take 1 capsule by mouth daily. Estradiol/Progesterone 0.5mg /100mg  Capsule (Compounded by pharmacy)      PROVENTIL HFA 108 (90 BASE) MCG/ACT inhaler   Generic drug: albuterol   Inhale 2 puffs into the lungs 2 (two) times daily as needed. SOB and wheezing      topiramate 100 MG tablet   Commonly known as: TOPAMAX   Take 100 mg by mouth daily.      TUDORZA PRESSAIR 400 MCG/ACT Aepb   Generic drug: Aclidinium Bromide   Inhale 2 puffs into the lungs 2 (two) times daily.        Diagnostic Studies: Dg Chest 2 View  04/24/2012  *RADIOLOGY REPORT*  Clinical Data:    preoperative for cervical decompression procedure  CHEST - 2 VIEW  Comparison: July 12, 2011  Findings:  There is a degree of underlying emphysematous change. There is no edema or consolidation.  Heart size is normal. Pulmonary vascularity reflects underlying emphysematous change. No adenopathy.  No bony lesions.  IMPRESSION: Pain emphysema.  No edema or consolidation.   Original Report Authenticated By: Bretta Bang, M.D.    Dg Cervical Spine 1 View  05/06/2012  *RADIOLOGY REPORT*  Clinical Data: ACDF at C5-6 and C6-7.  DG C-ARM 1-60 MIN,DG CERVICAL SPINE - 1 VIEW  Technique: A single intraoperative lateral fluoro spot image is submitted.  Comparison:  CT of the cervical spine 07/16/2004.  Findings: Patient is status post ACDF at C5-6 and C6-7.  Disc spacers are in place. Alignment is anatomic.  A surgical sponge is in place.  The patient is intubated.  An NG tube is in place.  IMPRESSION:  1.  Status post ACDF at C5-6 and C6-7 without radiographic evidence for complication.   Original Report Authenticated By: Marin Roberts, M.D.    Dg C-arm 1-60 Min  05/06/2012  *RADIOLOGY REPORT*  Clinical Data: ACDF at C5-6 and C6-7.  DG C-ARM 1-60 MIN,DG CERVICAL SPINE - 1 VIEW  Technique: A single intraoperative lateral fluoro spot image is submitted.  Comparison:  CT of the cervical spine 07/16/2004.  Findings: Patient is status post ACDF at C5-6 and C6-7.  Disc spacers are in place. Alignment is anatomic.  A surgical sponge is in place.  The patient is intubated.  An NG tube is in place.  IMPRESSION:  1.  Status post ACDF at C5-6 and C6-7 without radiographic evidence for complication.   Original Report Authenticated By: Marin Roberts, M.D.     Disposition: 01-Home or Self Care  1 day PO 2 Level ACDF C5-C7 with resolved radiculopathy -arm pain gone, neck pain minimal and well controlled on Tylenol only -pt d/c with written prescriptions for valium and percocet if needed -pt instructed to wear  hard collar at all times except after 5 days can remove when showering and utilize [place] collar -F/u scheduled in our office for 2 weeks PO -D/C instructions provided to pt on handout   Signed: Georga Bora 05/07/2012, 12:28 PM

## 2012-05-08 ENCOUNTER — Encounter (HOSPITAL_COMMUNITY): Payer: Self-pay | Admitting: Orthopedic Surgery

## 2012-09-18 ENCOUNTER — Other Ambulatory Visit (HOSPITAL_COMMUNITY): Payer: Self-pay | Admitting: Obstetrics and Gynecology

## 2012-09-18 DIAGNOSIS — Z1231 Encounter for screening mammogram for malignant neoplasm of breast: Secondary | ICD-10-CM

## 2012-10-05 ENCOUNTER — Ambulatory Visit (HOSPITAL_COMMUNITY)
Admission: RE | Admit: 2012-10-05 | Discharge: 2012-10-05 | Disposition: A | Discharge status: Home / Self Care | Payer: BC Managed Care – PPO | Source: Ambulatory Visit | Attending: Obstetrics and Gynecology | Admitting: Obstetrics and Gynecology

## 2012-10-05 DIAGNOSIS — Z1231 Encounter for screening mammogram for malignant neoplasm of breast: Secondary | ICD-10-CM

## 2013-03-12 ENCOUNTER — Other Ambulatory Visit: Payer: Self-pay | Admitting: Obstetrics and Gynecology

## 2013-03-16 ENCOUNTER — Ambulatory Visit (INDEPENDENT_AMBULATORY_CARE_PROVIDER_SITE_OTHER): Payer: BC Managed Care – PPO | Admitting: Radiology

## 2013-03-16 DIAGNOSIS — R209 Unspecified disturbances of skin sensation: Secondary | ICD-10-CM

## 2013-03-19 NOTE — Procedures (Signed)
   [facility]  NCS (NERVE CONDUCTION STUDY) WITH EMG (ELECTROMYOGRAPHY) REPORT   STUDY DATE: 03/19/13 PATIENT NAME: [name] DOB: 1955-05-22 MRN: [id number]  ORDERING CLINICIAN: Hart Carwin  TECHNOLOGIST: Kaylyn Lim  ELECTROMYOGRAPHER: Glenford Bayley. Markea Ruzich, MD  CLINICAL INFORMATION: 57 year old female with lower extremity pain, numbness and burning.  FINDINGS: NERVE CONDUCTION STUDY: Bilateral peroneal and tibial motor responses have normal distal latencies, amplitudes, conduction velocities and F-wave latencies. Bilateral sural sensory responses are normal.  NEEDLE ELECTROMYOGRAPHY: Needle EMG was not ordered.  IMPRESSION:  This is a normal nerve conduction study of the lower extremities.   INTERPRETING PHYSICIAN:  Suanne Marker, MD Certified in Neurology, Neurophysiology and Neuroimaging  [facility] [address], [address] [place], [place] [address] [phone]

## 2013-09-16 ENCOUNTER — Other Ambulatory Visit (HOSPITAL_COMMUNITY): Payer: Self-pay | Admitting: Family Medicine

## 2013-09-16 DIAGNOSIS — Z1231 Encounter for screening mammogram for malignant neoplasm of breast: Secondary | ICD-10-CM

## 2013-10-11 ENCOUNTER — Ambulatory Visit (HOSPITAL_COMMUNITY)
Admission: RE | Admit: 2013-10-11 | Discharge: 2013-10-11 | Disposition: A | Discharge status: Home / Self Care | Payer: BC Managed Care – PPO | Source: Ambulatory Visit | Attending: Family Medicine | Admitting: Family Medicine

## 2013-10-11 DIAGNOSIS — Z1231 Encounter for screening mammogram for malignant neoplasm of breast: Secondary | ICD-10-CM | POA: Insufficient documentation

## 2013-11-01 ENCOUNTER — Other Ambulatory Visit: Payer: Self-pay | Admitting: Family Medicine

## 2013-11-01 DIAGNOSIS — R0609 Other forms of dyspnea: Secondary | ICD-10-CM

## 2013-11-01 DIAGNOSIS — R0989 Other specified symptoms and signs involving the circulatory and respiratory systems: Principal | ICD-10-CM

## 2013-11-02 ENCOUNTER — Ambulatory Visit (INDEPENDENT_AMBULATORY_CARE_PROVIDER_SITE_OTHER): Payer: BC Managed Care – PPO | Admitting: Pulmonary Disease

## 2013-11-02 DIAGNOSIS — R0609 Other forms of dyspnea: Secondary | ICD-10-CM

## 2013-11-02 DIAGNOSIS — R0989 Other specified symptoms and signs involving the circulatory and respiratory systems: Principal | ICD-10-CM

## 2013-11-02 LAB — PULMONARY FUNCTION TEST
FEF 25-75 POST: 1.39 L/s
FEF 25-75 Pre: 1.12 L/sec
FEF2575-%Change-Post: 23 %
FEF2575-%PRED-POST: 51 %
FEF2575-%Pred-Pre: 41 %
FEV1-%CHANGE-POST: 6 %
FEV1-%PRED-POST: 64 %
FEV1-%Pred-Pre: 60 %
FEV1-POST: 1.86 L
FEV1-Pre: 1.75 L
FEV1FVC-%CHANGE-POST: -5 %
FEV1FVC-%PRED-PRE: 89 %
FEV6-%Change-Post: 12 %
FEV6-%PRED-PRE: 67 %
FEV6-%Pred-Post: 76 %
FEV6-POST: 2.74 L
FEV6-Pre: 2.44 L
FEV6FVC-%CHANGE-POST: -1 %
FEV6FVC-%PRED-PRE: 101 %
FEV6FVC-%Pred-Post: 100 %
FVC-%Change-Post: 13 %
FVC-%PRED-PRE: 66 %
FVC-%Pred-Post: 75 %
FVC-PRE: 2.48 L
FVC-Post: 2.81 L
PRE FEV1/FVC RATIO: 71 %
PRE FEV6/FVC RATIO: 99 %
Post FEV1/FVC ratio: 66 %
Post FEV6/FVC ratio: 98 %

## 2013-11-02 NOTE — Progress Notes (Signed)
Spirometry pre and post done today. 

## 2014-03-31 ENCOUNTER — Ambulatory Visit: Payer: BC Managed Care – PPO | Admitting: Internal Medicine

## 2014-05-16 ENCOUNTER — Ambulatory Visit (INDEPENDENT_AMBULATORY_CARE_PROVIDER_SITE_OTHER)
Admission: RE | Admit: 2014-05-16 | Discharge: 2014-05-16 | Disposition: A | Discharge status: Home / Self Care | Payer: BLUE CROSS/BLUE SHIELD | Source: Ambulatory Visit | Attending: Internal Medicine | Admitting: Internal Medicine

## 2014-05-16 ENCOUNTER — Encounter: Payer: Self-pay | Admitting: Internal Medicine

## 2014-05-16 ENCOUNTER — Ambulatory Visit (INDEPENDENT_AMBULATORY_CARE_PROVIDER_SITE_OTHER): Payer: BLUE CROSS/BLUE SHIELD | Admitting: Internal Medicine

## 2014-05-16 VITALS — BP 114/80 | HR 86 | Ht 66.0 in | Wt 183.8 lb

## 2014-05-16 DIAGNOSIS — J449 Chronic obstructive pulmonary disease, unspecified: Secondary | ICD-10-CM

## 2014-05-16 DIAGNOSIS — Z23 Encounter for immunization: Secondary | ICD-10-CM

## 2014-05-16 DIAGNOSIS — J432 Centrilobular emphysema: Secondary | ICD-10-CM

## 2014-05-16 DIAGNOSIS — J309 Allergic rhinitis, unspecified: Secondary | ICD-10-CM

## 2014-05-16 DIAGNOSIS — J302 Other seasonal allergic rhinitis: Secondary | ICD-10-CM

## 2014-05-16 DIAGNOSIS — J3089 Other allergic rhinitis: Secondary | ICD-10-CM

## 2014-05-16 MED ORDER — TIOTROPIUM BROMIDE MONOHYDRATE 2.5 MCG/ACT IN AERS: 2.0000 | INHALATION_SPRAY | Freq: Every day | RESPIRATORY_TRACT | Status: DC

## 2014-05-16 MED ORDER — TIOTROPIUM BROMIDE MONOHYDRATE 1.25 MCG/ACT IN AERS: 2.0000 | INHALATION_SPRAY | Freq: Every day | RESPIRATORY_TRACT | Status: DC

## 2014-05-16 MED ORDER — MONTELUKAST SODIUM 10 MG PO TABS: 10.0000 mg | ORAL_TABLET | Freq: Every day | ORAL | Status: DC

## 2014-05-16 NOTE — Progress Notes (Signed)
07/12/11- [age] yoF former smoker with history of allergic rhinitis and asthma/COPD.  Husband here. LOV 03/28/11 c/o chest congestion, chest tightness/pressure, increased SOB, some wheezing qhs, prod cough with yellow mucusx 5-6 wks. Denies f/c/s now.  Caught cold 5 or 6 weeks ago with fever. Was treated with Z-Paks twice and a course of prednisone. 2 weeks ago saw Dr. Edd Fabianrossley/ENT and treated with Cleocin. Still bothersome postnasal drainage without blowing or headache. Chest tight with some wheeze and shortness of breath. Substernal aching. Cough is becoming less productive but has had yellow to green mucus. No blood or swollen glands. Using Ventolin inhaler with little benefit. Dropped off of Symbicort.  05/16/14-55 yoF former smoker with history of allergic rhinitis and asthma/COPD.  Husband here. FOLLOW FOR:  breathing sometimes acting up, uses inhalers and it gets better.  Wants Prevnar 13 shot.  Pt wants to have xray to check up on COPD.  CAT Score: 11 Occasional cough with clear phlegm and some sinus drip but no recent acute infection or exacerbation. Not wheezing. She wanted update on chest x-ray because of  smoking history and concerned about COPD. She is active without limitation by dyspnea in daily life. PFT 11/02/13- mild obstructive airways disease with insignificant response to bronchodilator CXR 04/24/12- I reviewed images IMPRESSION: Pain (sic) emphysema. No edema or consolidation. Original Report Authenticated By: Bretta BangWilliam Woodruff, M.D.  ROS-see HPI Constitutional:   No-   weight loss, night sweats, fevers, chills, fatigue, lassitude. HEENT:   No-  headaches, difficulty swallowing, tooth/dental problems, sore throat,       No-  sneezing, itching, ear ache,  +nasal congestion, post nasal drip,  CV:  No-   chest pain, orthopnea, PND, swelling in lower extremities, anasarca, dizziness, palpitations Resp: +  shortness of breath with exertion or at rest.              +   productive cough,   No non-productive cough,  No- coughing up of blood.              No change in color of mucus.  No wheezing.   Skin: No-   rash or lesions. GI:  No-   heartburn, indigestion, abdominal pain, nausea, vomiting, GU:  MS:  No-   joint pain or swelling.   Neuro-     nothing unusual Psych:  No- change in mood or affect. No depression or anxiety.  No memory loss.  OBJ- Physical Exam General- Alert, Oriented, Affect-appropriate, Distress- none acute Skin- rash-none, lesions- none, excoriation- none Lymphadenopathy- none Head- atraumatic            Eyes- Gross vision intact, PERRLA, conjunctivae and secretions clear            Ears- Hearing, canals-normal            Nose- Clear, no-Septal dev, mucus, polyps, erosion, perforation             Throat- Mallampati II , mucosa cobblestoned , drainage- none, tonsils- atrophic Neck- flexible , trachea midline, no stridor , thyroid nl, carotid no bruit Chest - symmetrical excursion , unlabored           Heart/CV- RRR , no murmur , no gallop  , no rub, nl s1 s2                           - JVD- none , edema- none, stasis changes- none, varices- none  Lung- clear to P&A, wheeze- none, cough- none , dullness-none, rub- none           Chest wall-  Abd-  Br/ Gen/ Rectal- Not done, not indicated Extrem- cyanosis- none, clubbing, none, atrophy- none, strength- nl Neuro- grossly intact to observation       

## 2014-05-16 NOTE — Patient Instructions (Addendum)
CXR- dx Emphysema centrilobular  Prevnar 13 pneumonia vaccine  Sample and script for Spiriva Respiclick  2 puffs once daily   Try this instead of Tudorza for comparison  Singulair refill sent

## 2014-05-17 DIAGNOSIS — J449 Chronic obstructive pulmonary disease, unspecified: Secondary | ICD-10-CM

## 2014-05-17 HISTORY — DX: Chronic obstructive pulmonary disease, unspecified: J44.9

## 2014-05-17 NOTE — Assessment & Plan Note (Signed)
Discussed Singulair and compared with antihistamines.

## 2014-05-17 NOTE — Assessment & Plan Note (Addendum)
We discussed the definition of obstructive airways disease and reviewed chest x-ray and PFT results. Minimal symptoms now as she remains off of cigarettes long-term. She dislikes the taste of Tudorza inhaler so we will let her try Spiriva Plan-update chest x-ray, Spiriva

## 2014-06-13 ENCOUNTER — Other Ambulatory Visit: Payer: Self-pay | Admitting: Family Medicine

## 2014-06-13 DIAGNOSIS — R51 Headache: Principal | ICD-10-CM

## 2014-06-13 DIAGNOSIS — R519 Headache, unspecified: Secondary | ICD-10-CM

## 2014-06-17 ENCOUNTER — Encounter (HOSPITAL_COMMUNITY): Payer: Self-pay | Admitting: *Deleted

## 2014-06-17 ENCOUNTER — Emergency Department (HOSPITAL_COMMUNITY)
Admission: EM | Admit: 2014-06-17 | Discharge: 2014-06-17 | Disposition: A | Discharge status: Home / Self Care | Payer: BLUE CROSS/BLUE SHIELD | Attending: Emergency Medicine | Admitting: Emergency Medicine

## 2014-06-17 ENCOUNTER — Ambulatory Visit
Admission: RE | Admit: 2014-06-17 | Discharge: 2014-06-17 | Disposition: A | Discharge status: Home / Self Care | Payer: BLUE CROSS/BLUE SHIELD | Source: Ambulatory Visit | Attending: Family Medicine | Admitting: Family Medicine

## 2014-06-17 DIAGNOSIS — Z8659 Personal history of other mental and behavioral disorders: Secondary | ICD-10-CM | POA: Insufficient documentation

## 2014-06-17 DIAGNOSIS — R519 Headache, unspecified: Secondary | ICD-10-CM

## 2014-06-17 DIAGNOSIS — Z87440 Personal history of urinary (tract) infections: Secondary | ICD-10-CM | POA: Diagnosis not present

## 2014-06-17 DIAGNOSIS — G2581 Restless legs syndrome: Secondary | ICD-10-CM | POA: Diagnosis not present

## 2014-06-17 DIAGNOSIS — Z79890 Hormone replacement therapy (postmenopausal): Secondary | ICD-10-CM | POA: Insufficient documentation

## 2014-06-17 DIAGNOSIS — Z87891 Personal history of nicotine dependence: Secondary | ICD-10-CM | POA: Diagnosis not present

## 2014-06-17 DIAGNOSIS — R51 Headache: Principal | ICD-10-CM

## 2014-06-17 DIAGNOSIS — G43909 Migraine, unspecified, not intractable, without status migrainosus: Secondary | ICD-10-CM | POA: Diagnosis not present

## 2014-06-17 DIAGNOSIS — I639 Cerebral infarction, unspecified: Secondary | ICD-10-CM | POA: Diagnosis not present

## 2014-06-17 DIAGNOSIS — Z79899 Other long term (current) drug therapy: Secondary | ICD-10-CM | POA: Diagnosis not present

## 2014-06-17 DIAGNOSIS — Z880 Allergy status to penicillin: Secondary | ICD-10-CM | POA: Insufficient documentation

## 2014-06-17 DIAGNOSIS — J45909 Unspecified asthma, uncomplicated: Secondary | ICD-10-CM | POA: Diagnosis not present

## 2014-06-17 DIAGNOSIS — R200 Anesthesia of skin: Secondary | ICD-10-CM | POA: Diagnosis present

## 2014-06-17 LAB — COMPREHENSIVE METABOLIC PANEL
ALBUMIN: 3.6 g/dL (ref 3.5–5.2)
ALK PHOS: 65 U/L (ref 39–117)
ALT: 17 U/L (ref 0–35)
AST: 20 U/L (ref 0–37)
Anion gap: 9 (ref 5–15)
BUN: 15 mg/dL (ref 6–23)
CO2: 26 mmol/L (ref 19–32)
Calcium: 9.1 mg/dL (ref 8.4–10.5)
Chloride: 107 mmol/L (ref 96–112)
Creatinine, Ser: 0.92 mg/dL (ref 0.50–1.10)
GFR calc non Af Amer: 68 mL/min — ABNORMAL LOW (ref >90)
GFR, EST AFRICAN AMERICAN: 79 mL/min — AB (ref >90)
Glucose, Bld: 114 mg/dL — ABNORMAL HIGH (ref 70–99)
POTASSIUM: 3.8 mmol/L (ref 3.5–5.1)
Sodium: 142 mmol/L (ref 135–145)
Total Bilirubin: 0.5 mg/dL (ref 0.3–1.2)
Total Protein: 6.3 g/dL (ref 6.0–8.3)

## 2014-06-17 LAB — DIFFERENTIAL
BASOS ABS: 0 10*3/uL (ref 0.0–0.1)
BASOS PCT: 0 % (ref 0–1)
Eosinophils Absolute: 0.3 10*3/uL (ref 0.0–0.7)
Eosinophils Relative: 4 % (ref 0–5)
Lymphocytes Relative: 27 % (ref 12–46)
Lymphs Abs: 2.2 10*3/uL (ref 0.7–4.0)
MONO ABS: 0.6 10*3/uL (ref 0.1–1.0)
Monocytes Relative: 7 % (ref 3–12)
Neutro Abs: 5 10*3/uL (ref 1.7–7.7)
Neutrophils Relative %: 62 % (ref 43–77)

## 2014-06-17 LAB — CBC
HCT: 38.9 % (ref 36.0–46.0)
HEMOGLOBIN: 12.6 g/dL (ref 12.0–15.0)
MCH: 30.4 pg (ref 26.0–34.0)
MCHC: 32.4 g/dL (ref 30.0–36.0)
MCV: 94 fL (ref 78.0–100.0)
Platelets: 265 10*3/uL (ref 150–400)
RBC: 4.14 MIL/uL (ref 3.87–5.11)
RDW: 13.9 % (ref 11.5–15.5)
WBC: 8 10*3/uL (ref 4.0–10.5)

## 2014-06-17 LAB — I-STAT TROPONIN, ED: Troponin i, poc: 0 ng/mL (ref 0.00–0.08)

## 2014-06-17 LAB — CBG MONITORING, ED: Glucose-Capillary: 95 mg/dL (ref 70–99)

## 2014-06-17 LAB — PROTIME-INR
INR: 0.99 (ref 0.00–1.49)
PROTHROMBIN TIME: 13.2 s (ref 11.6–15.2)

## 2014-06-17 LAB — APTT: aPTT: 28 seconds (ref 24–37)

## 2014-06-17 MED ORDER — LORAZEPAM 1 MG PO TABS
1.0000 mg | ORAL_TABLET | Freq: Once | ORAL | Status: AC
  Administered 2014-06-17: 1 mg via ORAL
  Filled 2014-06-17: qty 1

## 2014-06-17 MED ORDER — IOPAMIDOL (ISOVUE-300) INJECTION 61%
75.0000 mL | Freq: Once | INTRAVENOUS | Status: AC | PRN
  Administered 2014-06-17: 75 mL via INTRAVENOUS

## 2014-06-17 MED ORDER — ASPIRIN 81 MG PO CHEW
324.0000 mg | CHEWABLE_TABLET | Freq: Once | ORAL | Status: AC
  Administered 2014-06-17: 324 mg via ORAL
  Filled 2014-06-17: qty 4

## 2014-06-17 NOTE — ED Notes (Signed)
[place] was [place] the beach [place] she had a syncopal  Episode  whenshe fellk and her lt arm was numb  And she could not feel her lt face and  She had a rt sided headache which she has since then.    No speech problem her onlky residual is her headache.  She was seen by her doctor and she had a c-t scan  And was called  To tell her to come here

## 2014-06-17 NOTE — ED Notes (Signed)
Pt CBG 95 

## 2014-06-17 NOTE — Discharge Instructions (Signed)
Follow-up with her primary care physician to schedule outpatient MRI. Aspirin 81 mg daily.   Stroke Prevention Some medical conditions and behaviors are associated with an increased chance of having a stroke. You may prevent a stroke by making healthy choices and managing medical conditions. HOW CAN I REDUCE MY RISK OF HAVING A STROKE?   Stay physically active. Get at least 30 minutes of activity on most or all days.  Do not smoke. It may also be helpful to avoid exposure to secondhand smoke.  Limit alcohol use. Moderate alcohol use is considered to be:  No more than 2 drinks per day for men.  No more than 1 drink per day for nonpregnant women.  Eat healthy foods. This involves:  Eating 5 or more servings of fruits and vegetables a day.  Making dietary changes that address high blood pressure (hypertension), high cholesterol, diabetes, or obesity.  Manage your cholesterol levels.  Making food choices that are high in fiber and low in saturated fat, trans fat, and cholesterol may control cholesterol levels.  Take any prescribed medicines to control cholesterol as directed by your health care provider.  Manage your diabetes.  Controlling your carbohydrate and sugar intake is recommended to manage diabetes.  Take any prescribed medicines to control diabetes as directed by your health care provider.  Control your hypertension.  Making food choices that are low in salt (sodium), saturated fat, trans fat, and cholesterol is recommended to manage hypertension.  Take any prescribed medicines to control hypertension as directed by your health care provider.  Maintain a healthy weight.  Reducing calorie intake and making food choices that are low in sodium, saturated fat, trans fat, and cholesterol are recommended to manage weight.  Stop drug abuse.  Avoid taking birth control pills.  Talk to your health care provider about the risks of taking birth control pills if you  are over 57 years old, smoke, get migraines, or have ever had a blood clot.  Get evaluated for sleep disorders (sleep apnea).  Talk to your health care provider about getting a sleep evaluation if you snore a lot or have excessive sleepiness.  Take medicines only as directed by your health care provider.  For some people, aspirin or blood thinners (anticoagulants) are helpful in reducing the risk of forming abnormal blood clots that can lead to stroke. If you have the irregular heart rhythm of atrial fibrillation, you should be on a blood thinner unless there is a good reason you cannot take them.  Understand all your medicine instructions.  Make sure that other conditions (such as anemia or atherosclerosis) are addressed. SEEK IMMEDIATE MEDICAL CARE IF:   You have sudden weakness or numbness of the face, arm, or leg, especially on one side of the body.  Your face or eyelid droops to one side.  You have sudden confusion.  You have trouble speaking (aphasia) or understanding.  You have sudden trouble seeing in one or both eyes.  You have sudden trouble walking.  You have dizziness.  You have a loss of balance or coordination.  You have a sudden, severe headache with no known cause.  You have new chest pain or an irregular heartbeat. Any of these symptoms may represent a serious problem that is an emergency. Do not wait to see if the symptoms will go away. Get medical help at once. Call your local emergency services (911 in [place] Do not drive yourself to the hospital. Document Released: 04/25/2004 Document Revised: 08/02/2013  Document Reviewed: 09/18/2012 [facility] Patient Information 2015 [place], [place]. This information is not intended to replace advice given to you by your health care provider. Make sure you discuss any questions you have with your health care provider.

## 2014-06-17 NOTE — ED Provider Notes (Addendum)
CSN: [id number]     Arrival date & time 06/17/14  1534 History   First MD Initiated Contact with Patient 06/17/14 1930     Chief Complaint  Patient presents with  . Cerebrovascular Accident      HPI  Patient presents for evaluation of possible stroke.  No prior history of stroke. Saturday night, 5 days ago, she was in [facility]. She was at the store. She notes falling to the floor noticed that she was weak and numb in her left arm, left face, and left leg. Son states that it took her "a few seconds to come around". No seizure activity.  Had an hour she was able to walk normally. Her husband helped her walk to a dance at the shopping center and they were at. She was evaluated by paramedics and had no apparent deficits.  She declined any medical care at that time. Return home Sunday, on Monday she saw her primary care physician Dr. Jeannetta Nap. CT was scheduled for today. CT scan with and without contrast shows right MCA distribution CVA.  She's had no weakness during the week. Intermittent and very mild headache. No history of hypertension diabetes. She has high cholesterol. Has been told to "watch her diet and exercise". Family history of her mother having heart attack. No history of strokes in the family.  Nonsmoker for the last 10 years.   Past Medical History  Diagnosis Date  . Asthma   . ALLERGIC RHINITIS   . PONV (postoperative nausea and vomiting)     Pt states Zofran does not relieve sx; prefers Phenergan  . Post-menopause on HRT (hormone replacement therapy)   . Restless leg   . Migraine headache     since childhood  . ADHD (attention deficit hyperactivity disorder)   . History of frequent urinary tract infections   . Claustrophobia     unable to ride in elavator;leave room door open   Past Surgical History  Procedure Laterality Date  . Sinus surgery repair    . Tubel ligation    . Partial hysterectomy    . Abdominal hysterectomy    . Tubal ligation    . Bladder  suspension  2001  . Anterior cervical decomp/discectomy fusion  05/06/2012    Procedure: ANTERIOR CERVICAL DECOMPRESSION/DISCECTOMY FUSION 2 LEVELS;  Surgeon: Emilee Hero, MD;  Location: [facility] OR;  Service: Orthopedics;  Laterality: Bilateral;  Anterior cervical decompression fusion, cervical 5-6, cervical 6-7 with instrumentation and allograft.   Family History  Problem Relation Age of Onset  . Lung cancer Brother   . Asthma Mother   . Allergies Mother   . Heart disease Father     cause of death   History  Substance Use Topics  . Smoking status: Former Smoker -- 2.00 packs/day for 25 years    Types: Cigarettes    Quit date: 04/02/2003  . Smokeless tobacco: Never Used  . Alcohol Use: No   OB History    No data available     Review of Systems  Constitutional: Negative for fever, chills, diaphoresis, appetite change and fatigue.  HENT: Negative for mouth sores, sore throat and trouble swallowing.   Eyes: Negative for visual disturbance.  Respiratory: Negative for cough, chest tightness, shortness of breath and wheezing.   Cardiovascular: Negative for chest pain.  Gastrointestinal: Negative for nausea, vomiting, abdominal pain, diarrhea and abdominal distention.  Endocrine: Negative for polydipsia, polyphagia and polyuria.  Genitourinary: Negative for dysuria, frequency and hematuria.  Musculoskeletal: Negative for  gait problem.  Skin: Negative for color change, pallor and rash.  Neurological: Positive for weakness and numbness. Negative for dizziness, syncope, light-headedness and headaches.  Hematological: Does not bruise/bleed easily.  Psychiatric/Behavioral: Negative for behavioral problems and confusion.      Allergies  Cefuroxime; Codeine; Hydrocodone; and Penicillins  Home Medications   Prior to Admission medications   Medication Sig Start Date End Date Taking? Authorizing Provider  albuterol (PROVENTIL HFA) 108 (90 BASE) MCG/ACT inhaler Inhale 2 puffs into  the lungs 2 (two) times daily as needed. SOB and wheezing   Yes Historical Provider, MD  clonazePAM (KLONOPIN) 0.5 MG tablet Take 0.5-1 mg by mouth at bedtime. Restless legs   Yes Historical Provider, MD  montelukast (SINGULAIR) 10 MG tablet Take 1 tablet (10 mg total) by mouth daily. 05/16/14  Yes Waymon Budgelinton D Young, MD  Multiple Vitamin (MULTIVITAMIN WITH MINERALS) TABS tablet Take 1 tablet by mouth daily.   Yes Historical Provider, MD  Tiotropium Bromide Monohydrate (SPIRIVA RESPIMAT) 1.25 MCG/ACT AERS Inhale 2 puffs into the lungs daily. 05/16/14  Yes Waymon Budgelinton D Young, MD  topiramate (TOPAMAX) 100 MG tablet Take 100 mg by mouth daily.   Yes Historical Provider, MD  Tiotropium Bromide Monohydrate (SPIRIVA RESPIMAT) 2.5 MCG/ACT AERS Inhale 2 puffs into the lungs daily. Patient not taking: Reported on 06/17/2014 05/16/14   Waymon Budgelinton D Young, MD   BP 155/85 mmHg  Pulse 84  Temp(Src) 98.6 F (37 C) (Oral)  Resp 18  Ht 5\' 6"  (1.676 m)  Wt 184 lb (83.462 kg)  BMI 29.71 kg/m2  SpO2 96% Physical Exam  Constitutional: She is oriented to person, place, and time. She appears well-developed and well-nourished. No distress.  HENT:  Head: Normocephalic.  Eyes: Conjunctivae are normal. Pupils are equal, round, and reactive to light. No scleral icterus.  Neck: Normal range of motion. Neck supple. No thyromegaly present.  Cardiovascular: Normal rate and regular rhythm.  Exam reveals no gallop and no friction rub.   No murmur heard. Pulmonary/Chest: Effort normal and breath sounds normal. No respiratory distress. She has no wheezes. She has no rales.  Abdominal: Soft. Bowel sounds are normal. She exhibits no distension. There is no tenderness. There is no rebound.  Musculoskeletal: Normal range of motion.  Neurological: She is alert and oriented to person, place, and time.  Skin: Skin is warm and dry. No rash noted.  Psychiatric: She has a normal mood and affect. Her behavior is normal.    ED Course   Procedures (including critical care time) Labs Review Labs Reviewed  COMPREHENSIVE METABOLIC PANEL - Abnormal; Notable for the following:    Glucose, Bld 114 (*)    GFR calc non Af Amer 68 (*)    GFR calc Af Amer 79 (*)    All other components within normal limits  PROTIME-INR  APTT  CBC  DIFFERENTIAL  CBG MONITORING, ED  Rosezena SensorI-STAT TROPOININ, ED    Imaging Review Ct Head W Wo Contrast  06/17/2014   CLINICAL DATA:  RIGHT temporal headache. LEFT hand numbness and leg weakness last week. Initial encounter.  EXAM: CT HEAD WITHOUT AND WITH CONTRAST  TECHNIQUE: Contiguous axial images were obtained from the base of the skull through the vertex without and with intravenous contrast  CONTRAST:  75 mL Isovue-300.  COMPARISON:  CT head 08/06/2004.  FINDINGS: There is an asymmetric area of hypoattenuation in the RIGHT frontal operculum or and posterior frontal cortex, slightly involving the white matter. Similar area also involves the RIGHT  insula and posterior limb internal capsule. Post infusion, there is variable enhancement. No midline shift.  No hemorrhage, mass lesion, hydrocephalus, or extra-axial fluid. No large vessel occlusion on post infusion imaging. No other areas of abnormal enhancement in the LEFT hemisphere or posterior fossa. Calvarium intact. Sinuses and mastoids clear.  IMPRESSION: Findings consistent with a subacute RIGHT MCA territory infarction affecting the insula, posterior limb internal capsule, and frontal operculum/posterior frontal cortex. Multicentric tumor or infection could have this appearance, but much less favored. Luxury perfusion postcontrast without evidence for large vessel occlusion. No midline shift.  A call is in to the ordering provider.   Electronically Signed   By: Davonna Belling M.D.   On: 06/17/2014 14:55     EKG Interpretation None      MDM   Final diagnoses:  Cerebral infarction due to unspecified mechanism    EKG shows sinus rhythm. Exam shows no  deficits. I talked with her about staying in the hospital for further testing including carotid ultrasound, MRI and MRA.  She declines.  She states she cannot and will not have an MRI because she is "deathly claustrophobic". I discussed her stay in the hospital as being a better option for the potential for anesthesiology consultation for sedation as needed for an MRI. She still declines.   I discussed the case with Dr. Thad Ranger of neurology. She felt that with the patient being nearly a week out from her event that she can safely be discharged to have outpatient studies. She recommended she follow-up with her primary care physician to schedule outpatient testing including MRI, and continue on a daily aspirin.  He states that she will contact the facility where she previously got an "open air"  MRI.  Given a dose of aspirin here. She will be discharged. Primary care follow-up.    Rolland Porter, MD 06/17/14 1610  Rolland Porter, MD 06/17/14 [id number]

## 2014-07-12 ENCOUNTER — Encounter: Payer: Self-pay | Admitting: Cardiology

## 2014-07-12 DIAGNOSIS — Z8673 Personal history of transient ischemic attack (TIA), and cerebral infarction without residual deficits: Secondary | ICD-10-CM

## 2014-07-12 DIAGNOSIS — Q2112 Patent foramen ovale: Secondary | ICD-10-CM

## 2014-07-12 DIAGNOSIS — Q211 Atrial septal defect: Secondary | ICD-10-CM

## 2014-07-12 HISTORY — DX: Patent foramen ovale: Q21.12

## 2014-07-12 HISTORY — DX: Personal history of transient ischemic attack (TIA), and cerebral infarction without residual deficits: Z86.73

## 2014-07-12 HISTORY — DX: Atrial septal defect: Q21.1

## 2014-07-12 NOTE — Progress Notes (Signed)
Patient ID: SHAKINAH NAVIS, female   DOB: 1955/06/14, 59 y.o.   MRN: [id number]    [place], Chancellor    Date of visit:  07/12/2014 DOB:  26-Apr-1955    Age:  59 yrs. Medical record number:  [id number]     Account number:  [id number] Primary Care Provider: Windle Guard ____________________________ CURRENT DIAGNOSES  1. CVA  2. Atrial septal defect  3. Abnormal result of cardiovascular function study, unspecified  4. Asthma  5. Obesity ____________________________ ALLERGIES  Codeine, Intolerance-unknown  Excedrin Migraine, Intolerance-unknown  Hydrocodone, Intolerance-unknown  Penicillins, Intolerance-unknown  Vicodin, Intolerance-unknown ____________________________ MEDICATIONS  1. clonazepam 2 mg tablet, PRN  2. escitalopram 20 mg tablet, PRN  3. sulfamethoxazole 200 mg-trimethoprim 40 mg/5 mL oral suspension, PRN  4. montelukast 10 mg tablet, 1 p.o. daily  5. Spiriva Respimat 2.5 mcg/actuation solution for inhalation, PRN  6. albuterol sulfate 90 mcg/actuation breath activated powder inhaler, PRN  7. aspirin 81 mg chewable tablet, 1 p.o. daily ____________________________ CHIEF COMPLAINTS  F/u p echo  Had cva 4 weeks ago ____________________________ HISTORY OF PRESENT ILLNESS This very nice 59 year old female is seen at the request of Dr. Jeannetta Nap for evaluation of a recent stroke and a newly diagnosed right and left shunt on transthoracic echocardiogram. The patient has previously been in good health except for long-standing history of migraine headaches. About 4 weeks ago the patient was at the beach with her [place] and had a headache and then went into a store and fell to the floor. She had difficulty moving her left foot and had numbness and tingling on that side. The symptoms resolved and she was able to get up and leave the store but  did not seek medical attention. She later came back to [facility] where she saw her primary doctor ordered a CT scan on her which showed a subacute right  MCA stroke. She has evidently also had an MRI and an MRA but do not have that report available today. She was sent to our office for an echocardiogram on 4 April that showed evidence of left to right shunting and a PFO/ASD and evidence of some mild right ventricular and right atrial enlargement. She doesn't currently have any neurologic symptoms but does have significant headaches that have been present for a long time. She doesn't have angina but does have some modest dyspnea. She has a prior diagnosis of COPD from smoking and has evidently some emphysema noted on chest x-ray. She denies PND orthopnea or edema. She does note at the time of her stroke that she had some tingling and numbness in her foot. She has no history of hypertension or hypercholesterolemia. ____________________________ PAST HISTORY  Past Medical Illnesses:  asthma, cva, obesity, cervical disc disease, COPD;  Cardiovascular Illnesses:  no previous history of cardiac disease.;  Surgical Procedures:  hysterectomy, laminectomy cervical, rhinoplasty, pubic sling and A/P repair;  NYHA Classification:  II;  Canadian Angina Classification:  Class 0: Asymptomatic;  Cardiology Procedures-Invasive:  no history of prior cardiac procedures;  Cardiology Procedures-Noninvasive:  echocardiogram April 2016, holter monitor;  LVEF not documented,   ____________________________ Jacqulyn Bath TEST DATES EKG Date:  07/12/2014;  Echocardiography Date: 07/04/2014;  Chest Xray Date: 05/16/2014;   ____________________________ FAMILY HISTORY Brother -- Brother alive with problem, Viral Hepatitis C Brother -- Brother alive with problem, Diabetes mellitus Brother -- Brother alive and well Brother -- Brother dead, Malignant neoplasm of lung Father -- Father dead, Death of unknown cause Mother -- Mother dead, Heart Attack, Congestive heart failure  Sister -- Sister alive and well ____________________________ SOCIAL HISTORY Alcohol Use:  socially;   Smoking:  used to smoke but quit 2005, 40 pack year history;  Diet:  regular diet;  Lifestyle:  married;  Exercise:  no regular exercise;  Occupation:  Associate Professorcosmetologist;  Residence:  lives with husband and children;   ____________________________ REVIEW OF SYSTEMS General:  obesity, weight gain  Integumentary:no rashes or new skin lesions. Eyes: denies diplopia, history of glaucoma or visual problems. Ears, Nose, Throat, Mouth:  denies any hearing loss, epistaxis, hoarseness or difficulty speaking. Respiratory: dyspnea with exertion, recent upper respiratory infection Cardiovascular:  please review HPI Abdominal: denies dyspepsia, GI bleeding, constipation, or diarrheaGenitourinary-Female: prior history of stress incontinence Musculoskeletal:  denies arthritis, venous insufficiency, or muscle weakness Neurological:  migraine headaches, recent stroke Psychiatric:  claustrophobia Hematological/Immunologic:  denies any food allergies, bleeding disorders. ____________________________ PHYSICAL EXAMINATION VITAL SIGNS  Blood Pressure:  132/70 Sitting, Left arm, regular cuff  , 128/74 Standing, Left arm and regular cuff   Pulse:  88/min. Weight:  185.00 lbs. Height:  66"BMI: 30  Constitutional:  pleasant white female, in no acute distress, mildly obese Skin:  warm and dry to touch, no apparent skin lesions, or masses noted. Head:  normocephalic, normal hair pattern, no masses or tenderness Eyes:  EOMS Intact, PERRLA, C and S clear, Funduscopic exam not done. ENT:  ears, nose and throat reveal no gross abnormalities.  Dentition good. Neck:  supple, without massess. No JVD, thyromegaly or carotid bruits. Carotid upstroke normal. Chest:  normal symmetry, clear to auscultation. Cardiac:  regular rhythm, normal S1 and S2, No S3 or S4, no murmurs, gallops or rubs detected. Abdomen:  abdomen soft,non-tender, no masses, no hepatospenomegaly, or aneurysm noted Peripheral Pulses:  the femoral,dorsalis pedis, and  posterior tibial pulses are full and equal bilaterally with no bruits auscultated. Extremities & Back:  no deformities, clubbing, cyanosis, erythema or edema observed. Normal muscle strength and tone. Neurological:  no gross motor or sensory deficits noted, affect appropriate, oriented x3. ____________________________ IMPRESSIONS/PLAN  1. Recent stroke that by history sounds embolic in nature 2. Evidence of left to right shunting and PFO versus small atrial septal defect on transthoracic echocardiogram with mild right ventricular and right atrial enlargement 3. Obesity 4. History of COPD 5. History of claustrophobia 6. History of migraine headaches  Recommendations:  Echocardiogram reviewed with patient and husband. She has evidence of a recent embolic stroke and is currently on aspirin. I have recommended that she have a TEE and she also needs to have an neurologic consultation soon. Review lab work from primary physician and she also will need lipid panel and other lab testing. Followup after she has had the TEE to discuss further treatment.  EKG shthe woundows vertical axis but is otherwise normal.  ____________________________ TODAYS ORDERS  1. TEE: First Available  2. 12 Lead EKG: Today 3. Neurology consult ASAP                       ____________________________ Cardiology Physician:  Darden PalmerW. Spencer Indigo Barbian, Jr. MD [facility] Center[facility]

## 2014-07-13 ENCOUNTER — Telehealth: Payer: Self-pay | Admitting: Cardiovascular Disease

## 2014-07-13 ENCOUNTER — Telehealth: Payer: Self-pay | Admitting: Neurology

## 2014-07-13 NOTE — Telephone Encounter (Signed)
Pt canceled appt to see Dr [name] on 07-18-14 due to getting in sooner somewhere else notified Dr Jeannetta NapElkins office

## 2014-07-13 NOTE — Telephone Encounter (Signed)
Called pt to let her know TEE is scheduled for Friday 4/15 @ 11 am(case #[id number]). TEE was ordered by Dr Donnie Ahoilley for CVA, procedure was explained by Dr Donnie Ahoilley.  Pt was instructed to be NPO after midnight and to be here at 10 am.

## 2014-07-14 ENCOUNTER — Ambulatory Visit (INDEPENDENT_AMBULATORY_CARE_PROVIDER_SITE_OTHER): Payer: BLUE CROSS/BLUE SHIELD | Admitting: Neurology

## 2014-07-14 ENCOUNTER — Encounter: Payer: Self-pay | Admitting: Neurology

## 2014-07-14 VITALS — BP 138/96 | HR 82 | Resp 14 | Ht 66.0 in | Wt 190.0 lb

## 2014-07-14 DIAGNOSIS — R200 Anesthesia of skin: Secondary | ICD-10-CM

## 2014-07-14 DIAGNOSIS — I63411 Cerebral infarction due to embolism of right middle cerebral artery: Secondary | ICD-10-CM | POA: Diagnosis not present

## 2014-07-14 DIAGNOSIS — Q2112 Patent foramen ovale: Secondary | ICD-10-CM

## 2014-07-14 DIAGNOSIS — Q211 Atrial septal defect: Secondary | ICD-10-CM | POA: Diagnosis not present

## 2014-07-14 DIAGNOSIS — G43109 Migraine with aura, not intractable, without status migrainosus: Secondary | ICD-10-CM

## 2014-07-14 DIAGNOSIS — I639 Cerebral infarction, unspecified: Secondary | ICD-10-CM

## 2014-07-14 HISTORY — DX: Migraine with aura, not intractable, without status migrainosus: G43.109

## 2014-07-14 HISTORY — DX: Anesthesia of skin: R20.0

## 2014-07-14 HISTORY — DX: Cerebral infarction, unspecified: I63.9

## 2014-07-14 NOTE — Progress Notes (Signed)
[facility]  PATIENT: [name] DOB: 1955-07-02  REFERRING DOCTOR OR PCP:  Windle Guard (PCP; phone: [phone] ) and Dr. Donnie Aho (Cardiology; fax: [phone]) SOURCE: patient, records form [facility], ED, CT images  _________________________________   HISTORICAL  CHIEF COMPLAINT:  Chief Complaint  Patient presents with  . Cerebrovascular Accident    Sts. hx. of migraines.  In early March she had h/a for several days.  On 06-11-14, they were driving to the beach--due to severity of h/a, she stopped to buy Advil--sts. after taking h/a resolved.  On 3-13, she was in a store with her 15 yr. old son, and had sudden onset of speech disturbance, brief loc per  son--upon waking, she had left sided numbness/weakness.  911 was called, but she refused transport to the ED.  She finally saw her pcp (Dr. Jeannetta Nap) on 06-14-14 and he wanted her to have immed. mri,   . Extremity Weakness    but she refused due to claustrophobia.  On 06-18-14, she had a CT head which she sts. showed CVA.  She was seen at [facility] but sts. she refused admission and mri there.  Sts. she finally agreed to mri/mra, which was done approx. 06-19-14.  Sts. she was told cva was due to a blood clot, and she was started on Asa  daily, and referred here.  Sts. she has also seen cardiology and been dx. with "hole in my heart."/fim    HISTORY OF PRESENT ILLNESS:  I had the pleasure seeing you patient, [name], at [facility] for neurologic consultation regarding her recent stroke.  As you know, she is a 59 yo left handed woman who had a stroke last month.     On 06/11/14 while at [facility], she passed out and fell. She was bending over a box of T-shirts for about a minute before this happened.    She noted left sided numbness that lasted about one hour.   She had a headache for the preceding 3 - 4 days.    One hour later, she felt the numbness was better but she noted left  sided clumsiness and had trouble getting into the car.   She felt dazed for the rest of that day.   She needed help to get undressed for bed.   She has noted some difficulty with word finding but has had occasional problems with this in the past.   Two days later, she saw her PCP and a CT was scheduled later that week.      She had the CT scan of the head on 06/17/2014. I personally reviewed the images. It shows 2 subacute strokes in related vascular territories, one in the posterior frontal lobe/anterior parietal lobe and the other in the frontal operculum with extension into the posterior limb of the internal capsule. She was sent to the emergency room for evaluation.   An outpatient evaluation was arranged and she was placed on aspirin.   She has never had a similar symptom in the past.      On 06/22/2014 she had MRI and MRA of the brain. According to notes from [facility], The MR angiogram showed "occlusion of an M2 branch of the right middle cerebral artery felt to most likely be embolic" the MRI showed subacute infarction in the right posterior insula and anterior right parietal lobe".   Those films were not available for review.  A transthoracic echocardiogram 07/04/14 with bubble contrast showed  an atrial septal aneurysm with a patent foramen ovale and normal EF% = 55%.  Although she feels better and practically at baseline, she notes that she is more tired.     She currently has a left frontal headache and had a right frontal headache x 3 days before the stroke.     She gets migraine with visual aura -- about 1 - 2 times a month associated with unilateral headache, photophobia, phonophobia and nausea.  She also occasionally gets milder headaches lasting a day or two.         REVIEW OF SYSTEMS: Constitutional: No fevers, chills, sweats, or change in appetite Eyes: No visual changes, double vision, eye pain Ear, nose and throat: No hearing loss, ear pain, nasal congestion, sore  throat Cardiovascular: No chest pain, palpitations Respiratory: No shortness of breath at rest or with exertion.   No wheezes GastrointestinaI: No nausea, vomiting, diarrhea, abdominal pain, fecal incontinence Genitourinary: No dysuria, urinary retention or frequency.  No nocturia. Musculoskeletal: No neck pain, back pain Integumentary: No rash, pruritus, skin lesions Neurological: as above Psychiatric: No depression at this time.  No anxiety Endocrine: No palpitations, diaphoresis, change in appetite, change in weigh or increased thirst Hematologic/Lymphatic: No anemia, purpura, petechiae. Allergic/Immunologic: No itchy/runny eyes, nasal congestion, recent allergic reactions, rashes  ALLERGIES: Allergies  Allergen Reactions  . Cefuroxime Swelling  . Codeine Nausea Only  . Excedrin Tension Headache [Acetaminophen-Caffeine] Swelling  . Hydrocodone Nausea Only  . Penicillins Swelling    HOME MEDICATIONS:  Current outpatient prescriptions:  .  albuterol (PROVENTIL HFA) 108 (90 BASE) MCG/ACT inhaler, Inhale 2 puffs into the lungs 2 (two) times daily as needed. SOB and wheezing, Disp: , Rfl:  .  aspirin 81 MG tablet, Take 81 mg by mouth daily., Disp: , Rfl:  .  cefdinir (OMNICEF) 300 MG capsule, Take 600 mg by mouth daily., Disp: , Rfl: 0 .  clonazePAM (KLONOPIN) 2 MG tablet, , Disp: , Rfl: 0 .  diclofenac (CATAFLAM) 50 MG tablet, , Disp: , Rfl:  .  Diethylpropion HCl CR 75 MG TB24, , Disp: , Rfl: 0 .  escitalopram (LEXAPRO) 20 MG tablet, , Disp: , Rfl: 12 .  montelukast (SINGULAIR) 10 MG tablet, Take 1 tablet (10 mg total) by mouth daily., Disp: 30 tablet, Rfl: prn .  Multiple Vitamin (MULTIVITAMIN WITH MINERALS) TABS tablet, Take 1 tablet by mouth daily., Disp: , Rfl:  .  Tiotropium Bromide Monohydrate (SPIRIVA RESPIMAT) 1.25 MCG/ACT AERS, Inhale 2 puffs into the lungs daily., Disp: 1 Inhaler, Rfl: prn .  ALPRAZolam (XANAX) 0.25 MG tablet, , Disp: , Rfl: 0 .  topiramate  (TOPAMAX) 100 MG tablet, Take 100 mg by mouth daily., Disp: , Rfl:   PAST MEDICAL HISTORY: Past Medical History  Diagnosis Date  . Asthma   . ALLERGIC RHINITIS   . PONV (postoperative nausea and vomiting)     Pt states Zofran does not relieve sx; prefers Phenergan  . Post-menopause on HRT (hormone replacement therapy)   . Restless leg   . Migraine headache     since childhood  . ADHD (attention deficit hyperactivity disorder)   . History of frequent urinary tract infections   . Claustrophobia     unable to ride in elavator;leave room door open  . Stroke     PAST SURGICAL HISTORY: Past Surgical History  Procedure Laterality Date  . Sinus surgery repair    . Tubel ligation    . Partial hysterectomy    .  Abdominal hysterectomy    . Tubal ligation    . Bladder suspension  2001  . Anterior cervical decomp/discectomy fusion  05/06/2012    Procedure: ANTERIOR CERVICAL DECOMPRESSION/DISCECTOMY FUSION 2 LEVELS;  Surgeon: Emilee Hero, MD;  Location: [facility] OR;  Service: Orthopedics;  Laterality: Bilateral;  Anterior cervical decompression fusion, cervical 5-6, cervical 6-7 with instrumentation and allograft.    FAMILY HISTORY: Family History  Problem Relation Age of Onset  . Lung cancer Brother   . Asthma Mother   . Allergies Mother   . Heart disease Father     cause of death    SOCIAL HISTORY:  History   Social History  . Marital Status: Married    Spouse Name: N/A  . Number of Children: 3  . Years of Education: N/A   Occupational History  . Hair Stylist    Social History Main Topics  . Smoking status: Former Smoker -- 2.00 packs/day for 25 years    Types: Cigarettes    Quit date: 04/02/2003  . Smokeless tobacco: Never Used  . Alcohol Use: No  . Drug Use: No  . Sexual Activity: Not on file   Other Topics Concern  . Not on file   Social History Narrative     PHYSICAL EXAM  Filed Vitals:   07/14/14 1008  BP: 138/96  Pulse: 82  Resp: 14  Height:  5\' 6"  (1.676 m)  Weight: 190 lb (86.183 kg)    Body mass index is 30.68 kg/(m^2).   General: The patient is well-developed and well-nourished and in no acute distress  Eyes:  Funduscopic exam shows normal optic discs and retinal vessels.  Neck: The neck is supple, no carotid bruits are noted.  The neck is nontender.  Cardiovascular: The heart has a regular rate and rhythm with a normal S1 and S2. There were no murmurs, gallops or rubs. Lungs are clear to auscultation.  Skin: Extremities are without significant edema.  Musculoskeletal:  Back is nontender  Neurologic Exam  Mental status: The patient is alert and oriented x 3 at the time of the examination. The patient has apparent normal recent and remote memory, with an apparently normal attention span and concentration ability.   Speech is near normal - made 2 word finding errors  Cranial nerves: Extraocular movements are full. Pupils are equal, round, and reactive to light and accomodation.  Visual fields are full.  Facial symmetry is present. There is good facial sensation to soft touch bilaterally.Facial strength is normal.  Trapezius and sternocleidomastoid strength is normal. No dysarthria is noted.  The tongue is midline, and the patient has symmetric elevation of the soft palate. No obvious hearing deficits are noted.  Motor:  Muscle bulk is normal.   Tone is normal. Strength is  5 / 5 in all 4 extremities.   Sensory: Sensory testing is intact to pinprick, soft touch and vibration sensation in all 4 extremities.  Coordination: Cerebellar testing reveals good finger-nose-finger and heel-to-shin bilaterally.  Gait and station: Station is normal.   Gait is normal. Tandem gait is normal. Romberg is negative.   Reflexes: Deep tendon reflexes are symmetric and normal bilaterally.   Plantar responses are flexor.    DIAGNOSTIC DATA (LABS, IMAGING, TESTING) - I reviewed patient records, labs, notes, testing and imaging myself  where available.  Lab Results  Component Value Date   WBC 8.0 06/17/2014   HGB 12.6 06/17/2014   HCT 38.9 06/17/2014   MCV 94.0 06/17/2014   PLT  265 06/17/2014      Component Value Date/Time   NA 142 06/17/2014 1601   K 3.8 06/17/2014 1601   CL 107 06/17/2014 1601   CO2 26 06/17/2014 1601   GLUCOSE 114* 06/17/2014 1601   BUN 15 06/17/2014 1601   CREATININE 0.92 06/17/2014 1601   CALCIUM 9.1 06/17/2014 1601   PROT 6.3 06/17/2014 1601   ALBUMIN 3.6 06/17/2014 1601   AST 20 06/17/2014 1601   ALT 17 06/17/2014 1601   ALKPHOS 65 06/17/2014 1601   BILITOT 0.5 06/17/2014 1601   GFRNONAA 68* 06/17/2014 1601   GFRAA 79* 06/17/2014 1601      ASSESSMENT AND PLAN  Cerebral infarction due to embolism of right middle cerebral artery - Plan: Hypercoagulable panel, comprehensive  Patent foramen ovale  Numbness  Migraine with aura and without status migrainosus, not intractable   In summary, Breionna Punt is a 59 year old woman who had an embolic stroke involving the partial territory of the right MCA artery.   Her main risk factor is a patent foramen ovale. We discussed that a PFO can be a significant risk factor for a stroke as it allows small clots to travel from the right side of the heart to the left side of the heart and then to the brain. I recommend that she have closure of the PFO. She informs me that she is having a transesophageal echocardiogram tomorrow to get a better visualization of the PFO and that her cardiologist also discussed closing the PFO recently. In the interim, she is to stay on aspirin as it may help to reduce her risk. I will check her blood for a hypercoagulable panel to make sure that she does not have additional risk factors that predispose her to having more strokes in the future.  I will see her back in 2 months or sooner if she has new or worsening neurologic symptoms. We will call her with the results of the blood work when they return and consider  changes in medications if indicated.   Charolett Yarrow A. Epimenio Foot, MD, PhD 07/14/2014, 10:22 AM Certified in Neurology, Clinical Neurophysiology, Sleep Medicine, Pain Medicine and Neuroimaging  [facility] [address] [place], [place] [address] [phone]

## 2014-07-15 ENCOUNTER — Encounter (HOSPITAL_COMMUNITY): Payer: Self-pay | Admitting: *Deleted

## 2014-07-15 ENCOUNTER — Encounter (HOSPITAL_COMMUNITY)
Admission: RE | Disposition: A | Discharge status: Home / Self Care | Payer: Self-pay | Source: Ambulatory Visit | Attending: Cardiovascular Disease

## 2014-07-15 ENCOUNTER — Ambulatory Visit (HOSPITAL_COMMUNITY)
Admission: RE | Admit: 2014-07-15 | Discharge: 2014-07-15 | Disposition: A | Discharge status: Home / Self Care | Payer: BLUE CROSS/BLUE SHIELD | Source: Ambulatory Visit | Attending: Cardiovascular Disease | Admitting: Cardiovascular Disease

## 2014-07-15 DIAGNOSIS — Z6830 Body mass index (BMI) 30.0-30.9, adult: Secondary | ICD-10-CM | POA: Insufficient documentation

## 2014-07-15 DIAGNOSIS — Z90710 Acquired absence of both cervix and uterus: Secondary | ICD-10-CM | POA: Diagnosis not present

## 2014-07-15 DIAGNOSIS — Z8673 Personal history of transient ischemic attack (TIA), and cerebral infarction without residual deficits: Secondary | ICD-10-CM | POA: Insufficient documentation

## 2014-07-15 DIAGNOSIS — J45909 Unspecified asthma, uncomplicated: Secondary | ICD-10-CM | POA: Diagnosis not present

## 2014-07-15 DIAGNOSIS — J449 Chronic obstructive pulmonary disease, unspecified: Secondary | ICD-10-CM | POA: Insufficient documentation

## 2014-07-15 DIAGNOSIS — Q211 Atrial septal defect, unspecified: Secondary | ICD-10-CM | POA: Insufficient documentation

## 2014-07-15 DIAGNOSIS — E669 Obesity, unspecified: Secondary | ICD-10-CM | POA: Diagnosis not present

## 2014-07-15 DIAGNOSIS — Z7982 Long term (current) use of aspirin: Secondary | ICD-10-CM | POA: Insufficient documentation

## 2014-07-15 DIAGNOSIS — Z87891 Personal history of nicotine dependence: Secondary | ICD-10-CM | POA: Diagnosis not present

## 2014-07-15 HISTORY — PX: TEE WITHOUT CARDIOVERSION: SHX5443

## 2014-07-15 SURGERY — ECHOCARDIOGRAM, TRANSESOPHAGEAL
Anesthesia: Moderate Sedation

## 2014-07-15 MED ORDER — FENTANYL CITRATE (PF) 100 MCG/2ML IJ SOLN
INTRAMUSCULAR | Status: DC | PRN
  Administered 2014-07-15 (×3): 25 ug via INTRAVENOUS

## 2014-07-15 MED ORDER — SODIUM CHLORIDE 0.9 % IV SOLN: INTRAVENOUS | Status: DC

## 2014-07-15 MED ORDER — FENTANYL CITRATE (PF) 100 MCG/2ML IJ SOLN
INTRAMUSCULAR | Status: AC
  Filled 2014-07-15: qty 2

## 2014-07-15 MED ORDER — BUTAMBEN-TETRACAINE-BENZOCAINE 2-2-14 % EX AERO
INHALATION_SPRAY | CUTANEOUS | Status: DC | PRN
  Administered 2014-07-15: 2 via TOPICAL

## 2014-07-15 MED ORDER — SODIUM CHLORIDE 0.9 % IV SOLN
INTRAVENOUS | Status: DC
  Administered 2014-07-15: 500 mL via INTRAVENOUS

## 2014-07-15 MED ORDER — MIDAZOLAM HCL 5 MG/ML IJ SOLN
INTRAMUSCULAR | Status: AC
  Filled 2014-07-15: qty 2

## 2014-07-15 MED ORDER — MIDAZOLAM HCL 10 MG/2ML IJ SOLN
INTRAMUSCULAR | Status: DC | PRN
  Administered 2014-07-15: 2 mg via INTRAVENOUS
  Administered 2014-07-15: 1 mg via INTRAVENOUS
  Administered 2014-07-15: 2 mg via INTRAVENOUS

## 2014-07-15 NOTE — H&P (View-Only) (Signed)
Patient ID: [name], female   DOB: 11/03/1955, 59 y.o.   MRN: [id number]    Larrick, Usha    Date of visit:  07/12/2014 DOB:  10/22/1955    Age:  59 yrs. Medical record number:  [id number]     Account number:  [id number] Primary Care Provider: ELKINS, WILSON ____________________________ CURRENT DIAGNOSES  1. CVA  2. Atrial septal defect  3. Abnormal result of cardiovascular function study, unspecified  4. Asthma  5. Obesity ____________________________ ALLERGIES  Codeine, Intolerance-unknown  Excedrin Migraine, Intolerance-unknown  Hydrocodone, Intolerance-unknown  Penicillins, Intolerance-unknown  Vicodin, Intolerance-unknown ____________________________ MEDICATIONS  1. clonazepam 2 mg tablet, PRN  2. escitalopram 20 mg tablet, PRN  3. sulfamethoxazole 200 mg-trimethoprim 40 mg/5 mL oral suspension, PRN  4. montelukast 10 mg tablet, 1 p.o. daily  5. Spiriva Respimat 2.5 mcg/actuation solution for inhalation, PRN  6. albuterol sulfate 90 mcg/actuation breath activated powder inhaler, PRN  7. aspirin 81 mg chewable tablet, 1 p.o. daily ____________________________ CHIEF COMPLAINTS  F/u p echo  Had cva 4 weeks ago ____________________________ HISTORY OF PRESENT ILLNESS This very nice 59-year-old female is seen at the request of Dr. Elkins for evaluation of a recent stroke and a newly diagnosed right and left shunt on transthoracic echocardiogram. The patient has previously been in good health except for long-standing history of migraine headaches. About 4 weeks ago the patient was at the beach with her son and had a headache and then went into a store and fell to the floor. She had difficulty moving her left foot and had numbness and tingling on that side. The symptoms resolved and she was able to get up and leave the store but  did not seek medical attention. She later came back to [place] where she saw her primary doctor ordered a CT scan on her which showed a subacute right  MCA stroke. She has evidently also had an MRI and an MRA but do not have that report available today. She was sent to our office for an echocardiogram on 4 April that showed evidence of left to right shunting and a PFO/ASD and evidence of some mild right ventricular and right atrial enlargement. She doesn't currently have any neurologic symptoms but does have significant headaches that have been present for a long time. She doesn't have angina but does have some modest dyspnea. She has a prior diagnosis of COPD from smoking and has evidently some emphysema noted on chest x-ray. She denies PND orthopnea or edema. She does note at the time of her stroke that she had some tingling and numbness in her foot. She has no history of hypertension or hypercholesterolemia. ____________________________ PAST HISTORY  Past Medical Illnesses:  asthma, cva, obesity, cervical disc disease, COPD;  Cardiovascular Illnesses:  no previous history of cardiac disease.;  Surgical Procedures:  hysterectomy, laminectomy cervical, rhinoplasty, pubic sling and A/P repair;  NYHA Classification:  II;  Canadian Angina Classification:  Class 0: Asymptomatic;  Cardiology Procedures-Invasive:  no history of prior cardiac procedures;  Cardiology Procedures-Noninvasive:  echocardiogram April 2016, holter monitor;  LVEF not documented,   ____________________________ CARDIO-PULMONARY TEST DATES EKG Date:  07/12/2014;  Echocardiography Date: 07/04/2014;  Chest Xray Date: 05/16/2014;   ____________________________ FAMILY HISTORY Brother -- Brother alive with problem, Viral Hepatitis C Brother -- Brother alive with problem, Diabetes mellitus Brother -- Brother alive and well Brother -- Brother dead, Malignant neoplasm of lung Father -- Father dead, Death of unknown cause Mother -- Mother dead, Heart Attack, Congestive heart failure   Sister -- Sister alive and well ____________________________ SOCIAL HISTORY Alcohol Use:  socially;   Smoking:  used to smoke but quit 2005, 40 pack year history;  Diet:  regular diet;  Lifestyle:  married;  Exercise:  no regular exercise;  Occupation:  cosmetologist;  Residence:  lives with husband and children;   ____________________________ REVIEW OF SYSTEMS General:  obesity, weight gain  Integumentary:no rashes or new skin lesions. Eyes: denies diplopia, history of glaucoma or visual problems. Ears, Nose, Throat, Mouth:  denies any hearing loss, epistaxis, hoarseness or difficulty speaking. Respiratory: dyspnea with exertion, recent upper respiratory infection Cardiovascular:  please review HPI Abdominal: denies dyspepsia, GI bleeding, constipation, or diarrheaGenitourinary-Female: prior history of stress incontinence Musculoskeletal:  denies arthritis, venous insufficiency, or muscle weakness Neurological:  migraine headaches, recent stroke Psychiatric:  claustrophobia Hematological/Immunologic:  denies any food allergies, bleeding disorders. ____________________________ PHYSICAL EXAMINATION VITAL SIGNS  Blood Pressure:  132/70 Sitting, Left arm, regular cuff  , 128/74 Standing, Left arm and regular cuff   Pulse:  88/min. Weight:  185.00 lbs. Height:  66"BMI: 30  Constitutional:  pleasant white female, in no acute distress, mildly obese Skin:  warm and dry to touch, no apparent skin lesions, or masses noted. Head:  normocephalic, normal hair pattern, no masses or tenderness Eyes:  EOMS Intact, PERRLA, C and S clear, Funduscopic exam not done. ENT:  ears, nose and throat reveal no gross abnormalities.  Dentition good. Neck:  supple, without massess. No JVD, thyromegaly or carotid bruits. Carotid upstroke normal. Chest:  normal symmetry, clear to auscultation. Cardiac:  regular rhythm, normal S1 and S2, No S3 or S4, no murmurs, gallops or rubs detected. Abdomen:  abdomen soft,non-tender, no masses, no hepatospenomegaly, or aneurysm noted Peripheral Pulses:  the femoral,dorsalis pedis, and  posterior tibial pulses are full and equal bilaterally with no bruits auscultated. Extremities & Back:  no deformities, clubbing, cyanosis, erythema or edema observed. Normal muscle strength and tone. Neurological:  no gross motor or sensory deficits noted, affect appropriate, oriented x3. ____________________________ IMPRESSIONS/PLAN  1. Recent stroke that by history sounds embolic in nature 2. Evidence of left to right shunting and PFO versus small atrial septal defect on transthoracic echocardiogram with mild right ventricular and right atrial enlargement 3. Obesity 4. History of COPD 5. History of claustrophobia 6. History of migraine headaches  Recommendations:  Echocardiogram reviewed with patient and husband. She has evidence of a recent embolic stroke and is currently on aspirin. I have recommended that she have a TEE and she also needs to have an neurologic consultation soon. Review lab work from primary physician and she also will need lipid panel and other lab testing. Followup after she has had the TEE to discuss further treatment.  EKG shthe woundows vertical axis but is otherwise normal.  ____________________________ TODAYS ORDERS  1. TEE: First Available  2. 12 Lead EKG: Today 3. Neurology consult ASAP                       ____________________________ Cardiology Physician:  W. Spencer Shritha Bresee, Jr. MD FACC    

## 2014-07-15 NOTE — Discharge Instructions (Signed)
Transesophageal Echocardiogram °Transesophageal echocardiography (TEE) is a picture test of your heart using sound waves. The pictures taken can give very detailed pictures of your heart. This can help your doctor see if there are problems with your heart. TEE can check: °· If your heart has blood clots in it. °· How well your heart valves are working. °· If you have an infection on the inside of your heart. °· Some of the major arteries of your heart. °· If your heart valve is working after a repair. °· Your heart before a procedure that uses a shock to your heart to get the rhythm back to normal. °BEFORE THE PROCEDURE °· Do not eat or drink for 6 hours before the procedure or as told by your doctor. °· Make plans to have someone drive you home after the procedure. Do not drive yourself home. °· An IV tube will be put in your arm. °PROCEDURE °· You will be given a medicine to help you relax (sedative). It will be given through the IV tube. °· A numbing medicine will be sprayed or gargled in the back of your throat to help numb it. °· The tip of the probe is placed into the back of your mouth. You will be asked to swallow. This helps to pass the probe into your esophagus. °· Once the tip of the probe is in the right place, your doctor can take pictures of your heart. °· You may feel pressure at the back of your throat. °AFTER THE PROCEDURE °· You will be taken to a recovery area so the sedative can wear off. °· Your throat may be sore and scratchy. This will go away slowly over time. °· You will go home when you are fully awake and able to swallow liquids. °· You should have someone stay with you for the next 24 hours. °· Do not drive or operate machinery for the next 24 hours. °Document Released: 01/13/2009 Document Revised: 03/23/2013 Document Reviewed: 09/17/2012 °Exit[employer]® Patient Information ©2015 [employer]. This information is not intended to replace advice given to you by your health care provider. Make  sure you discuss any questions you have with your health care provider. ° °

## 2014-07-15 NOTE — Progress Notes (Signed)
  Echocardiogram Echocardiogram Transesophageal has been performed.  Sherman Lipuma FRANCES 07/15/2014, 12:28 PM

## 2014-07-15 NOTE — Interval H&P Note (Signed)
History and Physical Interval Note:  07/15/2014 10:45 AM  [name]  has presented today for surgery, with the diagnosis of STROKE  The various methods of treatment have been discussed with the patient and family. After consideration of risks, benefits and other options for treatment, the patient has consented to  Procedure(s): TRANSESOPHAGEAL ECHOCARDIOGRAM (TEE) (N/A) as a surgical intervention .  The patient's history has been reviewed, patient examined, no change in status, stable for surgery.  I have reviewed the patient's chart and labs.  Questions were answered to the patient's satisfaction.     [name]

## 2014-07-15 NOTE — CV Procedure (Signed)
INDICATIONS: atrial septal defect  PROCEDURE:   Informed consent was obtained prior to the procedure. The risks, benefits and alternatives for the procedure were discussed and the patient comprehended these risks.  Risks include, but are not limited to, cough, sore throat, vomiting, nausea, somnolence, esophageal and stomach trauma or perforation, bleeding, low blood pressure, aspiration, pneumonia, infection, trauma to the teeth and death.    After a procedural time-out, the oropharynx was anesthetized with 20% benzocaine spray. The patient was given 5 mg versed and 75 mcg fentanyl for moderate sedation.   The transesophageal probe was inserted in the esophagus and stomach without difficulty and multiple views were obtained.  The patient was kept under observation until the patient left the procedure room.  The patient left the procedure room in stable condition.   Agitated microbubble saline contrast was not administered.  COMPLICATIONS:    There were no immediate complications.  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 Moderate interatrial shunt with almost entirely left to right shunt at rest, brief reversal with small right to left shunt with deep breathing or coughing. The ASD seems to have a good "rim" circumferentially. Mildly dilated right heart chambers, but normal estimated PA pressure. Otherwise normal study.  RECOMMENDATIONS:     Refer to discuss ASD device closure.  Time Spent Directly with the Patient:  60  minutes   Suriah Peragine 07/15/2014, 11:37 AM

## 2014-07-18 ENCOUNTER — Ambulatory Visit: Payer: BLUE CROSS/BLUE SHIELD | Admitting: Neurology

## 2014-07-18 ENCOUNTER — Encounter (HOSPITAL_COMMUNITY): Payer: Self-pay | Admitting: Cardiovascular Disease

## 2014-07-22 LAB — HYPERCOAGULABLE PANEL, COMPREHENSIVE
ACT. PRT C RESIST W/FV DEFIC.: 2.3 ratio
APTT: 25.5 s
AT III ACT/NOR PPP CHRO: 96 %
Beta-2 Glycoprotein I, IgA: <10 SAU
Beta-2 Glycoprotein I, IgM: <10 SMU
DRVVT SCREEN SECONDS: 40.9 s
Factor VII Antigen**: 163 % — ABNORMAL HIGH
Factor VIII Activity: 143 %
Hexagonal Phospholipid Neutral: 0 s
Homocysteine: 9.4 umol/L
PROTEIN C AG/FVII AG RATIO: 0.5 ratio
Prot C Ag Act/Nor PPP Imm: 87 %
Prot S Ag Act/Nor PPP Imm: 100 %
Protein S Ag/FVII Ag Ratio**: 0.6 ratio

## 2014-07-26 ENCOUNTER — Other Ambulatory Visit (HOSPITAL_COMMUNITY): Payer: Self-pay | Admitting: Cardiology

## 2014-07-26 ENCOUNTER — Ambulatory Visit (HOSPITAL_COMMUNITY)
Admission: RE | Admit: 2014-07-26 | Discharge: 2014-07-26 | Disposition: A | Discharge status: Home / Self Care | Payer: BLUE CROSS/BLUE SHIELD | Source: Ambulatory Visit | Attending: Vascular Surgery | Admitting: Vascular Surgery

## 2014-07-26 ENCOUNTER — Telehealth: Payer: Self-pay | Admitting: *Deleted

## 2014-07-26 DIAGNOSIS — R600 Localized edema: Secondary | ICD-10-CM | POA: Diagnosis present

## 2014-07-26 NOTE — Telephone Encounter (Signed)
Spoke with [name] and per RAS, advised her that bloodwork to check for clotting disorders was ok--advised she should keep f/u with cardiology.  She verbalized understanding of same/fim

## 2014-07-26 NOTE — Telephone Encounter (Signed)
-----   Message from Asa Lenteichard A Sater, MD sent at 07/26/2014 12:57 PM EDT ----- Please letr her know blood work for clotting disorder looked ok.     She should have a f/u with cards for her atrial septal defect already set up

## 2014-07-27 ENCOUNTER — Encounter: Payer: Self-pay | Admitting: Cardiovascular Disease

## 2014-07-27 ENCOUNTER — Ambulatory Visit (INDEPENDENT_AMBULATORY_CARE_PROVIDER_SITE_OTHER): Payer: BLUE CROSS/BLUE SHIELD | Admitting: Cardiovascular Disease

## 2014-07-27 VITALS — BP 130/90 | HR 84 | Ht 66.0 in | Wt 186.0 lb

## 2014-07-27 DIAGNOSIS — Q211 Atrial septal defect, unspecified: Secondary | ICD-10-CM

## 2014-07-27 LAB — BASIC METABOLIC PANEL
BUN: 22 mg/dL (ref 6–23)
CO2: 31 meq/L (ref 19–32)
CREATININE: 0.87 mg/dL (ref 0.40–1.20)
Calcium: 9.2 mg/dL (ref 8.4–10.5)
Chloride: 104 mEq/L (ref 96–112)
GFR: 71.46 mL/min (ref >60.00)
GLUCOSE: 87 mg/dL (ref 70–99)
Potassium: 3.6 mEq/L (ref 3.5–5.1)
Sodium: 140 mEq/L (ref 135–145)

## 2014-07-27 LAB — CBC
HEMATOCRIT: 39.8 % (ref 36.0–46.0)
Hemoglobin: 13.3 g/dL (ref 12.0–15.0)
MCHC: 33.4 g/dL (ref 30.0–36.0)
MCV: 91.3 fl (ref 78.0–100.0)
Platelets: 257 10*3/uL (ref 150.0–400.0)
RBC: 4.36 Mil/uL (ref 3.87–5.11)
RDW: 13.8 % (ref 11.5–15.5)
WBC: 11.7 10*3/uL — AB (ref 4.0–10.5)

## 2014-07-27 LAB — PROTIME-INR
INR: 1 ratio (ref 0.8–1.0)
Prothrombin Time: 10.9 s (ref 9.6–13.1)

## 2014-07-27 NOTE — Progress Notes (Signed)
 Cardiology Office Note   Date:  07/29/2014   ID:  [name], DOB 03/20/1956, MRN [id number]  PCP:  Decker,Kim OLIVER, MD  Cardiologist:  Spencer Tilley, MD    Chief Complaint  Patient presents with  . Shortness of Breath     History of Present Illness: [name] is a 59 y.o. female who presents for evaluation of atrial septal defect.  The patient had acute onset of aphasia, left-sided weakness, and collapse on March 13th while on vacation in [place], [place]. She did not have any immediate medical care and deficits resolved over 30-45 minutes. She did experience brief loss of consciousness.  A CT scan several days later after evaluation by her PCP showed a subacute MCA stroke.   Prior to this event, the patient has no past history of stroke or TIA. She has had no known hx of CAD or MI. She does have longstanding migraine headaches since age 15.   The patient has had exertional dyspnea which has been present for many years. She admits to longstanding exercise intolerance. No hx of congestive heart failure, leg swelling, orthopnea, or PND.   Past Medical History  Diagnosis Date  . Asthma   . ALLERGIC RHINITIS   . PONV (postoperative nausea and vomiting)     Pt states Zofran does not relieve sx; prefers Phenergan  . Post-menopause on HRT (hormone replacement therapy)   . Restless leg   . Migraine headache     since childhood  . ADHD (attention deficit hyperactivity disorder)   . History of frequent urinary tract infections   . Claustrophobia     unable to ride in elavator;leave room door open  . Stroke     Past Surgical History  Procedure Laterality Date  . Sinus surgery repair    . Tubel ligation    . Partial hysterectomy    . Abdominal hysterectomy    . Tubal ligation    . Bladder suspension  2001  . Anterior cervical decomp/discectomy fusion  05/06/2012    Procedure: ANTERIOR CERVICAL DECOMPRESSION/DISCECTOMY FUSION 2 LEVELS;  Surgeon: Mark  Leonard Dumonski, MD;  Location: [facility] OR;  Service: Orthopedics;  Laterality: Bilateral;  Anterior cervical decompression fusion, cervical 5-6, cervical 6-7 with instrumentation and allograft.  . Tee without cardioversion N/A 07/15/2014    Procedure: TRANSESOPHAGEAL ECHOCARDIOGRAM (TEE);  Surgeon: Mihai Croitoru, MD;  Location: [facility] ENDOSCOPY;  Service: Cardiovascular;  Laterality: N/A;    Current Outpatient Prescriptions  Medication Sig Dispense Refill  . acetaminophen (TYLENOL) 325 MG tablet Take 650 mg by mouth every 6 (six) hours as needed for headache.    . albuterol (PROVENTIL HFA) 108 (90 BASE) MCG/ACT inhaler Inhale 2 puffs into the lungs 2 (two) times daily as needed. SOB and wheezing    . aspirin 81 MG tablet Take 81 mg by mouth daily.    . clonazePAM (KLONOPIN) 2 MG tablet Take 2 mg by mouth at bedtime.   0  . escitalopram (LEXAPRO) 20 MG tablet Take 20 mg by mouth every morning.   12  . montelukast (SINGULAIR) 10 MG tablet Take 1 tablet (10 mg total) by mouth daily. 30 tablet prn  . Multiple Vitamin (MULTIVITAMIN WITH MINERALS) TABS tablet Take 1 tablet by mouth daily.    . Tiotropium Bromide Monohydrate (SPIRIVA RESPIMAT) 1.25 MCG/ACT AERS Inhale 2 puffs into the lungs daily. 1 Inhaler prn  . clopidogrel (PLAVIX) 75 MG tablet Take 1 tablet (75 mg total) by mouth daily. 90   tablet 3   No current facility-administered medications for this visit.    Allergies:   Penicillins; Cefuroxime; Excedrin tension headache; Hydrocodone; and Codeine   Social History:  The patient  reports that she quit smoking about 11 years ago. Her smoking use included Cigarettes. She has a 50 pack-year smoking history. She has never used smokeless tobacco. She reports that she does not drink alcohol or use illicit drugs.   Family History:  The patient's family history includes Allergies in her mother; Asthma in her mother; Heart disease in her father; Lung cancer in her brother.    ROS:  Please see the history  of present illness.  Otherwise, review of systems is positive for chest pain, cough, chest pressure, leg pain, wheezing.  All other systems are reviewed and negative.   PHYSICAL EXAM: VS:  BP 130/90 mmHg  Pulse 84  Ht 5' 6" (1.676 m)  Wt 186 lb (84.369 kg)  BMI 30.04 kg/m2 , BMI Body mass index is 30.04 kg/(m^2). GEN: Well nourished, well developed, in no acute distress HEENT: normal Neck: no JVD, no masses. No carotid bruits Cardiac: RRR without murmur or gallop                Respiratory:  clear to auscultation bilaterally, normal work of breathing GI: soft, nontender, nondistended, + BS MS: no deformity or atrophy Ext: no pretibial edema, pedal pulses 2+= bilaterally Skin: warm and dry, no rash Neuro:  Strength and sensation are intact Psych: euthymic mood, full affect  EKG:  EKG is not ordered today.  Recent Labs: 06/17/2014: ALT 17 07/27/2014: BUN 22; Creatinine 0.87; Hemoglobin 13.3; Platelets 257.0; Potassium 3.6; Sodium 140   Lipid Panel  No results found for: CHOL, TRIG, HDL, CHOLHDL, VLDL, LDLCALC, LDLDIRECT    Wt Readings from Last 3 Encounters:  07/27/14 186 lb (84.369 kg)  07/15/14 190 lb (86.183 kg)  07/14/14 190 lb (86.183 kg)     Cardiac Studies Reviewed: TEE: Study Conclusions  - Left ventricle: The cavity size was normal. Wall thickness was normal. Systolic function was normal. The estimated ejection fraction was in the range of 55% to 60%. Wall motion was normal; there were no regional wall motion abnormalities. - Left atrium: No evidence of thrombus in the atrial cavity or appendage. No spontaneous echo contrast was observed. - Right ventricle: The cavity size was mildly dilated. - Right atrium: The atrium was mildly dilated. - Atrial septum: There was a medium-sized secundum atrial septal defect. 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 Doppler showed a  moderate left-to-right atrial level shunt.  ASSESSMENT AND PLAN: Ostium Secundum ASD: the patient has echo evidence of hemodynamically-significant left-to-right shunting based on dilated RA/RV. She also has had a stroke which raises concern for paradoxical embolism. ASD closure is indicated in the setting of left-to-right shunting to prevent right sided heart failure and pulmonary hypertension. Also indicated to reduce risk of cardioembolic events.   I have personally reviewed her TEE which demonstrates a moderate sized secundum defect with anatomic characteristics favorable for transcatheter closure. There are no significant valvular lesions and LV function is normal.   I have reviewed the risks, indications, and alternatives to ICE-guided ASD closure with the patient and her husband who is present for her evaluation. They understand risks of bleeding, infection, arrhythmia, MI, stroke, perforation/tamponade, device embolization, and emergency cardiac surgery are less than 1%. They also understand   the risk of late device erosion occurs at a frequency of 0.3% and while rare can be life-threatening. After full discussion, the patient agrees to proceed. I explained the details of the procedure, expected recovery, and showed her the Amplatzer Septal Occluder device.  The patient will be started on plavix which will have to be continued x 6 months post-procedure.    Current medicines are reviewed with the patient today.  The patient does not have concerns regarding medicines.  Labs/ tests ordered today include:   Orders Placed This Encounter  Procedures  . Basic Metabolic Panel (BMET)  . CBC  . INR/PT    Signed, Arelis Neumeier, MD  07/29/2014 11:32 PM    [facility] [address], [place], [place]  [address] Phone: [phone]; Fax: [phone]   

## 2014-07-27 NOTE — Patient Instructions (Signed)
Medication Instructions:  Your physician recommends that you continue on your current medications as directed. Please refer to the Current Medication list given to you today.  Labwork: Your physician recommends that you have lab work today: BMP, CBC and PT/INR  Testing/Procedures: Your physician has recommended ASD closure.   Follow-Up: We will arrange further follow-up after your closure.   Any Other Special Instructions Will Be Listed Below (If Applicable).

## 2014-07-29 ENCOUNTER — Telehealth: Payer: Self-pay | Admitting: *Deleted

## 2014-07-29 MED ORDER — CLOPIDOGREL BISULFATE 75 MG PO TABS: 75.0000 mg | ORAL_TABLET | Freq: Every day | ORAL | Status: DC

## 2014-07-29 NOTE — Telephone Encounter (Signed)
Per Dr [name] he would like the pt to start Plavix 75mg  take one by mouth daily. Rx sent to the pharmacy and I left a detailed message on the pt's voicemail to start this medication.

## 2014-07-29 NOTE — Telephone Encounter (Signed)
Patient stated that at her recent ov Dr [name] had told her that she would need to be on plavix prior to her procedure. I do not see and documentation of this. Please advise. Thanks, MI

## 2014-07-31 HISTORY — PX: OTHER SURGICAL HISTORY: SHX169

## 2014-08-07 MED ORDER — VANCOMYCIN HCL IN DEXTROSE 1-5 GM/200ML-% IV SOLN
1000.0000 mg | INTRAVENOUS | Status: AC
  Administered 2014-08-08: 1000 mg via INTRAVENOUS
  Filled 2014-08-07 (×2): qty 200

## 2014-08-08 ENCOUNTER — Ambulatory Visit (HOSPITAL_COMMUNITY)
Admission: RE | Admit: 2014-08-08 | Payer: BLUE CROSS/BLUE SHIELD | Source: Ambulatory Visit | Admitting: Cardiovascular Disease

## 2014-08-08 ENCOUNTER — Encounter (HOSPITAL_COMMUNITY): Payer: Self-pay

## 2014-08-08 ENCOUNTER — Ambulatory Visit (HOSPITAL_COMMUNITY)
Admission: RE | Admit: 2014-08-08 | Discharge: 2014-08-09 | Disposition: A | Discharge status: Home / Self Care | Payer: BLUE CROSS/BLUE SHIELD | Source: Ambulatory Visit | Attending: Cardiovascular Disease | Admitting: Cardiovascular Disease

## 2014-08-08 ENCOUNTER — Encounter (HOSPITAL_COMMUNITY): Admission: RE | Payer: Self-pay | Source: Ambulatory Visit

## 2014-08-08 ENCOUNTER — Encounter (HOSPITAL_COMMUNITY)
Admission: RE | Disposition: A | Discharge status: Home / Self Care | Payer: BLUE CROSS/BLUE SHIELD | Source: Ambulatory Visit | Attending: Cardiovascular Disease

## 2014-08-08 DIAGNOSIS — Z79890 Hormone replacement therapy (postmenopausal): Secondary | ICD-10-CM | POA: Insufficient documentation

## 2014-08-08 DIAGNOSIS — J45909 Unspecified asthma, uncomplicated: Secondary | ICD-10-CM | POA: Insufficient documentation

## 2014-08-08 DIAGNOSIS — Z8673 Personal history of transient ischemic attack (TIA), and cerebral infarction without residual deficits: Secondary | ICD-10-CM | POA: Diagnosis not present

## 2014-08-08 DIAGNOSIS — G43909 Migraine, unspecified, not intractable, without status migrainosus: Secondary | ICD-10-CM | POA: Diagnosis not present

## 2014-08-08 DIAGNOSIS — Z87891 Personal history of nicotine dependence: Secondary | ICD-10-CM | POA: Diagnosis not present

## 2014-08-08 DIAGNOSIS — Z886 Allergy status to analgesic agent status: Secondary | ICD-10-CM | POA: Insufficient documentation

## 2014-08-08 DIAGNOSIS — Z888 Allergy status to other drugs, medicaments and biological substances status: Secondary | ICD-10-CM | POA: Diagnosis not present

## 2014-08-08 DIAGNOSIS — Z90710 Acquired absence of both cervix and uterus: Secondary | ICD-10-CM | POA: Insufficient documentation

## 2014-08-08 DIAGNOSIS — G2581 Restless legs syndrome: Secondary | ICD-10-CM | POA: Diagnosis not present

## 2014-08-08 DIAGNOSIS — Q211 Atrial septal defect, unspecified: Secondary | ICD-10-CM

## 2014-08-08 DIAGNOSIS — I639 Cerebral infarction, unspecified: Secondary | ICD-10-CM | POA: Diagnosis present

## 2014-08-08 DIAGNOSIS — J449 Chronic obstructive pulmonary disease, unspecified: Secondary | ICD-10-CM | POA: Insufficient documentation

## 2014-08-08 DIAGNOSIS — Z981 Arthrodesis status: Secondary | ICD-10-CM | POA: Diagnosis not present

## 2014-08-08 DIAGNOSIS — Z87440 Personal history of urinary (tract) infections: Secondary | ICD-10-CM | POA: Diagnosis not present

## 2014-08-08 DIAGNOSIS — Z880 Allergy status to penicillin: Secondary | ICD-10-CM | POA: Insufficient documentation

## 2014-08-08 HISTORY — DX: Chronic obstructive pulmonary disease, unspecified: J44.9

## 2014-08-08 HISTORY — PX: CARDIAC CATHETERIZATION: SHX172

## 2014-08-08 LAB — POCT ACTIVATED CLOTTING TIME
ACTIVATED CLOTTING TIME: 208 s
ACTIVATED CLOTTING TIME: 220 s
Activated Clotting Time: 159 seconds

## 2014-08-08 SURGERY — ASD/VSD CLOSURE

## 2014-08-08 SURGERY — REPAIR, ATRIAL SEPTAL DEFECT
Anesthesia: LOCAL

## 2014-08-08 MED ORDER — CLOPIDOGREL BISULFATE 75 MG PO TABS
75.0000 mg | ORAL_TABLET | Freq: Every day | ORAL | Status: DC
  Administered 2014-08-09: 10:00:00 75 mg via ORAL
  Filled 2014-08-08: qty 1

## 2014-08-08 MED ORDER — MONTELUKAST SODIUM 10 MG PO TABS
10.0000 mg | ORAL_TABLET | Freq: Every day | ORAL | Status: DC
  Administered 2014-08-08 – 2014-08-09 (×2): 10 mg via ORAL
  Filled 2014-08-08 (×2): qty 1

## 2014-08-08 MED ORDER — FENTANYL CITRATE (PF) 100 MCG/2ML IJ SOLN
INTRAMUSCULAR | Status: DC | PRN
  Administered 2014-08-08: 50 ug via INTRAVENOUS
  Administered 2014-08-08 (×2): 25 ug via INTRAVENOUS

## 2014-08-08 MED ORDER — ONDANSETRON HCL 4 MG/2ML IJ SOLN: 4.0000 mg | Freq: Four times a day (QID) | INTRAMUSCULAR | Status: DC | PRN

## 2014-08-08 MED ORDER — FENTANYL CITRATE (PF) 100 MCG/2ML IJ SOLN
INTRAMUSCULAR | Status: AC
  Filled 2014-08-08: qty 2

## 2014-08-08 MED ORDER — MIDAZOLAM HCL 2 MG/2ML IJ SOLN
INTRAMUSCULAR | Status: AC
  Filled 2014-08-08: qty 2

## 2014-08-08 MED ORDER — ALBUTEROL SULFATE (2.5 MG/3ML) 0.083% IN NEBU: 2.0000 mL | INHALATION_SOLUTION | RESPIRATORY_TRACT | Status: DC | PRN

## 2014-08-08 MED ORDER — ASPIRIN EC 81 MG PO TBEC
81.0000 mg | DELAYED_RELEASE_TABLET | Freq: Every day | ORAL | Status: DC
  Administered 2014-08-09: 10:00:00 81 mg via ORAL
  Filled 2014-08-08: qty 1

## 2014-08-08 MED ORDER — HEPARIN (PORCINE) IN NACL 2-0.9 UNIT/ML-% IJ SOLN
INTRAMUSCULAR | Status: AC
  Filled 2014-08-08: qty 1000

## 2014-08-08 MED ORDER — SODIUM CHLORIDE 0.9 % IV SOLN: INTRAVENOUS | Status: AC

## 2014-08-08 MED ORDER — SODIUM CHLORIDE 0.9 % IJ SOLN: 3.0000 mL | INTRAMUSCULAR | Status: DC | PRN

## 2014-08-08 MED ORDER — SODIUM CHLORIDE 0.9 % IV SOLN: 250.0000 mL | INTRAVENOUS | Status: DC | PRN

## 2014-08-08 MED ORDER — ESCITALOPRAM OXALATE 20 MG PO TABS
20.0000 mg | ORAL_TABLET | Freq: Every day | ORAL | Status: DC
  Administered 2014-08-08 – 2014-08-09 (×2): 20 mg via ORAL
  Filled 2014-08-08 (×2): qty 1

## 2014-08-08 MED ORDER — ALPRAZOLAM 0.25 MG PO TABS
1.0000 mg | ORAL_TABLET | Freq: Two times a day (BID) | ORAL | Status: DC | PRN
  Administered 2014-08-09 (×2): 1 mg via ORAL
  Filled 2014-08-08 (×2): qty 4

## 2014-08-08 MED ORDER — ACETAMINOPHEN 325 MG PO TABS
650.0000 mg | ORAL_TABLET | ORAL | Status: DC | PRN
  Administered 2014-08-08 – 2014-08-09 (×2): 650 mg via ORAL
  Filled 2014-08-08 (×2): qty 2

## 2014-08-08 MED ORDER — CLONAZEPAM 1 MG PO TABS
2.0000 mg | ORAL_TABLET | Freq: Every day | ORAL | Status: DC
  Administered 2014-08-08: 2 mg via ORAL
  Filled 2014-08-08: qty 4

## 2014-08-08 MED ORDER — HEPARIN SODIUM (PORCINE) 1000 UNIT/ML IJ SOLN
INTRAMUSCULAR | Status: DC | PRN
  Administered 2014-08-08: 6000 [IU] via INTRAVENOUS
  Administered 2014-08-08: 3000 [IU] via INTRAVENOUS

## 2014-08-08 MED ORDER — LIDOCAINE HCL (PF) 1 % IJ SOLN
INTRAMUSCULAR | Status: AC
  Filled 2014-08-08: qty 30

## 2014-08-08 MED ORDER — SODIUM CHLORIDE 0.9 % IJ SOLN: 3.0000 mL | Freq: Two times a day (BID) | INTRAMUSCULAR | Status: DC

## 2014-08-08 MED ORDER — SODIUM CHLORIDE 0.9 % IV SOLN
INTRAVENOUS | Status: DC
  Administered 2014-08-08: 06:00:00 via INTRAVENOUS

## 2014-08-08 MED ORDER — ASPIRIN 81 MG PO CHEW
CHEWABLE_TABLET | ORAL | Status: AC
  Administered 2014-08-08: 81 mg via ORAL
  Filled 2014-08-08: qty 1

## 2014-08-08 MED ORDER — MIDAZOLAM HCL 2 MG/2ML IJ SOLN
INTRAMUSCULAR | Status: DC | PRN
  Administered 2014-08-08 (×3): 2 mg via INTRAVENOUS

## 2014-08-08 MED ORDER — ASPIRIN 81 MG PO CHEW
81.0000 mg | CHEWABLE_TABLET | ORAL | Status: AC
  Administered 2014-08-08: 81 mg via ORAL

## 2014-08-08 MED ORDER — HEPARIN SODIUM (PORCINE) 1000 UNIT/ML IJ SOLN
INTRAMUSCULAR | Status: AC
  Filled 2014-08-08: qty 1

## 2014-08-08 SURGICAL SUPPLY — 17 items
BALLN SIZING AMPLATZER 24 (BALLOONS) ×2
BALLN SIZING AMPLATZER 24MM (BALLOONS) ×1
BALLOON SIZING AMPLATZER 24 (BALLOONS) IMPLANT
BALN SZ 70X4.5 7FR 3 LUM (BALLOONS) ×1
CATH ACUNAV REPROCESSED (CATHETERS) ×2 IMPLANT
CATH INFINITI 6F MPA2 100CM (CATHETERS) ×2 IMPLANT
COVER SWIFTLINK CONNECTOR (BAG) ×2 IMPLANT
GUIDEWIRE AMPLATZER 1.5JX260 (WIRE) ×2 IMPLANT
OCCLUDER AMPLATZER SEPTAL 14MM (Prosthesis & Implant Heart) ×2 IMPLANT
PACK CARDIAC CATHETERIZATION (CUSTOM PROCEDURE TRAY) ×2 IMPLANT
PROTECTION STATION PRESSURIZED (MISCELLANEOUS) ×3
SHEATH PINNACLE 6F 10CM (SHEATH) ×2 IMPLANT
SHEATH PINNACLE 8F 10CM (SHEATH) ×2 IMPLANT
SHEATH PINNACLE 9F 10CM (SHEATH) ×2 IMPLANT
STATION PROTECTION PRESSURIZED (MISCELLANEOUS) IMPLANT
SYSTEM DELIVERY AMPLATZER 8FR (SHEATH) ×2 IMPLANT
WIRE EMERALD 3MM-J .035X150CM (WIRE) ×2 IMPLANT

## 2014-08-08 NOTE — Interval H&P Note (Signed)
History and Physical Interval Note:  08/08/2014 7:49 AM  [name]  has presented today for surgery, with the diagnosis of ASD  The various methods of treatment have been discussed with the patient and family. After consideration of risks, benefits and other options for treatment, the patient has consented to  Procedure(s): ASD Closure (N/A) as a surgical intervention .  The patient's history has been reviewed, patient examined, no change in status, stable for surgery.  I have reviewed the patient's chart and labs.  Questions were answered to the patient's satisfaction.     Tonny Bollmanooper, Orest Dygert

## 2014-08-08 NOTE — H&P (View-Only) (Signed)
Cardiology Office Note   Date:  07/29/2014   ID:  [name], DOB 08-06-1955, MRN [id number]  PCP:  Kaleen MaskELKINS,[name], MD  Cardiologist:  Viann FishSpencer Tilley, MD    Chief Complaint  Patient presents with  . Shortness of Breath     History of Present Illness: [name] is a 59 y.o. female who presents for evaluation of atrial septal defect.  The patient had acute onset of aphasia, left-sided weakness, and collapse on March 13th while on vacation in [place]. She did not have any immediate medical care and deficits resolved over 30-45 minutes. She did experience brief loss of consciousness.  A CT scan several days later after evaluation by her PCP showed a subacute MCA stroke.   Prior to this event, the patient has no past history of stroke or TIA. She has had no known hx of CAD or MI. She does have longstanding migraine headaches since age 59.   The patient has had exertional dyspnea which has been present for many years. She admits to longstanding exercise intolerance. No hx of congestive heart failure, leg swelling, orthopnea, or PND.   Past Medical History  Diagnosis Date  . Asthma   . ALLERGIC RHINITIS   . PONV (postoperative nausea and vomiting)     Pt states Zofran does not relieve sx; prefers Phenergan  . Post-menopause on HRT (hormone replacement therapy)   . Restless leg   . Migraine headache     since childhood  . ADHD (attention deficit hyperactivity disorder)   . History of frequent urinary tract infections   . Claustrophobia     unable to ride in elavator;leave room door open  . Stroke     Past Surgical History  Procedure Laterality Date  . Sinus surgery repair    . Tubel ligation    . Partial hysterectomy    . Abdominal hysterectomy    . Tubal ligation    . Bladder suspension  2001  . Anterior cervical decomp/discectomy fusion  05/06/2012    Procedure: ANTERIOR CERVICAL DECOMPRESSION/DISCECTOMY FUSION 2 LEVELS;  Surgeon: Emilee HeroMark  Leonard Dumonski, MD;  [facility];  Service: Orthopedics;  Laterality: Bilateral;  Anterior cervical decompression fusion, cervical 5-6, cervical 6-7 with instrumentation and allograft.  Rhae Hammock. Tee without cardioversion N/A 07/15/2014    Procedure: TRANSESOPHAGEAL ECHOCARDIOGRAM (TEE);  Surgeon: Thurmon FairMihai Croitoru, MD;  [facility];  Service: Cardiovascular;  Laterality: N/A;    Current Outpatient Prescriptions  Medication Sig Dispense Refill  . acetaminophen (TYLENOL) 325 MG tablet Take 650 mg by mouth every 6 (six) hours as needed for headache.    . albuterol (PROVENTIL HFA) 108 (90 BASE) MCG/ACT inhaler Inhale 2 puffs into the lungs 2 (two) times daily as needed. SOB and wheezing    . aspirin 81 MG tablet Take 81 mg by mouth daily.    . clonazePAM (KLONOPIN) 2 MG tablet Take 2 mg by mouth at bedtime.   0  . escitalopram (LEXAPRO) 20 MG tablet Take 20 mg by mouth every morning.   12  . montelukast (SINGULAIR) 10 MG tablet Take 1 tablet (10 mg total) by mouth daily. 30 tablet prn  . Multiple Vitamin (MULTIVITAMIN WITH MINERALS) TABS tablet Take 1 tablet by mouth daily.    . Tiotropium Bromide Monohydrate (SPIRIVA RESPIMAT) 1.25 MCG/ACT AERS Inhale 2 puffs into the lungs daily. 1 Inhaler prn  . clopidogrel (PLAVIX) 75 MG tablet Take 1 tablet (75 mg total) by mouth daily. 90  tablet 3   No current facility-administered medications for this visit.    Allergies:   Penicillins; Cefuroxime; Excedrin tension headache; Hydrocodone; and Codeine   Social History:  The patient  reports that she quit smoking about 11 years ago. Her smoking use included Cigarettes. She has a 50 pack-year smoking history. She has never used smokeless tobacco. She reports that she does not drink alcohol or use illicit drugs.   Family History:  The patient's family history includes Allergies in her mother; Asthma in her mother; Heart disease in her father; Lung cancer in her brother.    ROS:  Please see the history  of present illness.  Otherwise, review of systems is positive for chest pain, cough, chest pressure, leg pain, wheezing.  All other systems are reviewed and negative.   PHYSICAL EXAM: VS:  BP 130/90 mmHg  Pulse 84  Ht 5\' 6"  (1.676 m)  Wt 186 lb (84.369 kg)  BMI 30.04 kg/m2 , BMI Body mass index is 30.04 kg/(m^2). GEN: Well nourished, well developed, in no acute distress HEENT: normal Neck: no JVD, no masses. No carotid bruits Cardiac: RRR without murmur or gallop                Respiratory:  clear to auscultation bilaterally, normal work of breathing GI: soft, nontender, nondistended, + BS MS: no deformity or atrophy Ext: no pretibial edema, pedal pulses 2+= bilaterally Skin: warm and dry, no rash Neuro:  Strength and sensation are intact Psych: euthymic mood, full affect  EKG:  EKG is not ordered today.  Recent Labs: 06/17/2014: ALT 17 07/27/2014: BUN 22; Creatinine 0.87; Hemoglobin 13.3; Platelets 257.0; Potassium 3.6; Sodium 140   Lipid Panel  No results found for: CHOL, TRIG, HDL, CHOLHDL, VLDL, LDLCALC, LDLDIRECT    Wt Readings from Last 3 Encounters:  07/27/14 186 lb (84.369 kg)  07/15/14 190 lb (86.183 kg)  07/14/14 190 lb (86.183 kg)     Cardiac Studies Reviewed: TEE: Study Conclusions  - Left ventricle: The cavity size was normal. Wall thickness was normal. Systolic function was normal. The estimated ejection fraction was in the range of 55% to 60%. Wall motion was normal; there were no regional wall motion abnormalities. - Left atrium: No evidence of thrombus in the atrial cavity or appendage. No spontaneous echo contrast was observed. - Right ventricle: The cavity size was mildly dilated. - Right atrium: The atrium was mildly dilated. - Atrial septum: There was a medium-sized secundum atrial septal defect. 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 Doppler showed a  moderate left-to-right atrial level shunt.  ASSESSMENT AND PLAN: Ostium Secundum ASD: the patient has echo evidence of hemodynamically-significant left-to-right shunting based on dilated RA/RV. She also has had a stroke which raises concern for paradoxical embolism. ASD closure is indicated in the setting of left-to-right shunting to prevent right sided heart failure and pulmonary hypertension. Also indicated to reduce risk of cardioembolic events.   I have personally reviewed her TEE which demonstrates a moderate sized secundum defect with anatomic characteristics favorable for transcatheter closure. There are no significant valvular lesions and LV function is normal.   I have reviewed the risks, indications, and alternatives to ICE-guided ASD closure with the patient and her husband who is present for her evaluation. They understand risks of bleeding, infection, arrhythmia, MI, stroke, perforation/tamponade, device embolization, and emergency cardiac surgery are less than 1%. They also understand  the risk of late device erosion occurs at a frequency of 0.3% and while rare can be life-threatening. After full discussion, the patient agrees to proceed. I explained the details of the procedure, expected recovery, and showed her the Amplatzer Septal Occluder device.  The patient will be started on plavix which will have to be continued x 6 months post-procedure.    Current medicines are reviewed with the patient today.  The patient does not have concerns regarding medicines.  Labs/ tests ordered today include:   Orders Placed This Encounter  Procedures  . Basic Metabolic Panel (BMET)  . CBC  . INR/PT    Signed, Tonny Bollman, MD  07/29/2014 11:32 PM    [facility] [address] [place], [place], [place]  [address] Phone: [phone]; Fax: [phone]

## 2014-08-08 NOTE — Progress Notes (Signed)
Site area: right groin  Site Prior to Removal:  Level 0  Pressure Applied For 12 MINUTES    Minutes Beginning at 1040  Manual:   Yes.    Patient Status During Pull:  A&O X4. No complaints voiced. Nonlabored breathing noted.  Post Pull Groin Site:  Level 0  Post Pull Instructions Given:  Yes.    Post Pull Pulses Present:  Yes.    Dressing Applied:  Yes.    Comments:  2 venous sheath removed at this time. Patient tolerated well. Complained of chest pain post removal that she stated was there prior to arriving to floor. Patient stated "I told the other nurse but the pain is not bad or any worse. I do not want anything for pain."  EKG obtained, NSR. Brion AlimentBerge NP informed. Pressure dressing applied. C/D/I.

## 2014-08-08 NOTE — Progress Notes (Signed)
POST-CATH NOTE:  Groin site stable. Pt had transient chest discomfort this afternoon, now feels fine.   EKG reviewed and WNL.   Plan: CXR and echo in am. Home after studies interpreted.   ASA/plavix x 6 months  1 month echo and follow-up visit (can be done same day)  SBE prophylaxis x 6 months  Tonny BollmanCooper, Vonya Ohalloran 08/08/2014 6:09 PM

## 2014-08-09 ENCOUNTER — Ambulatory Visit (HOSPITAL_COMMUNITY): Payer: BLUE CROSS/BLUE SHIELD

## 2014-08-09 ENCOUNTER — Other Ambulatory Visit: Payer: Self-pay | Admitting: Physician Assistant

## 2014-08-09 DIAGNOSIS — Q211 Atrial septal defect: Secondary | ICD-10-CM | POA: Diagnosis not present

## 2014-08-09 DIAGNOSIS — G43909 Migraine, unspecified, not intractable, without status migrainosus: Secondary | ICD-10-CM | POA: Diagnosis not present

## 2014-08-09 DIAGNOSIS — Q2111 Secundum atrial septal defect: Secondary | ICD-10-CM

## 2014-08-09 DIAGNOSIS — J45909 Unspecified asthma, uncomplicated: Secondary | ICD-10-CM | POA: Diagnosis not present

## 2014-08-09 DIAGNOSIS — I63411 Cerebral infarction due to embolism of right middle cerebral artery: Secondary | ICD-10-CM | POA: Diagnosis not present

## 2014-08-09 DIAGNOSIS — J449 Chronic obstructive pulmonary disease, unspecified: Secondary | ICD-10-CM | POA: Diagnosis not present

## 2014-08-09 DIAGNOSIS — I370 Nonrheumatic pulmonary valve stenosis: Secondary | ICD-10-CM

## 2014-08-09 MED ORDER — CLOPIDOGREL BISULFATE 75 MG PO TABS: 75.0000 mg | ORAL_TABLET | Freq: Every day | ORAL | Status: DC

## 2014-08-09 MED FILL — Lidocaine HCl Local Preservative Free (PF) Inj 1%: INTRAMUSCULAR | Qty: 30 | Status: AC

## 2014-08-09 MED FILL — Heparin Sodium (Porcine) 2 Unit/ML in Sodium Chloride 0.9%: INTRAMUSCULAR | Qty: 1000 | Status: AC

## 2014-08-09 NOTE — Progress Notes (Signed)
Patient Name: Kim SeltzerConnie B Decker Date of Encounter: 08/09/2014     Principal Problem:   Atrial septal defect Active Problems:   CVA (cerebral infarction)    SUBJECTIVE  Denies any CP or SOB. Had some chest discomfort yesterday immediately after the procedure, however nothing overnight.   CURRENT MEDS . aspirin EC  81 mg Oral Daily  . clonazePAM  2 mg Oral QHS  . clopidogrel  75 mg Oral Daily  . escitalopram  20 mg Oral Daily  . montelukast  10 mg Oral Daily  . sodium chloride  3 mL Intravenous Q12H    OBJECTIVE  Filed Vitals:   08/08/14 2000 08/08/14 2116 08/09/14 0035 08/09/14 0555  BP:  107/60 110/66 119/64  Pulse:  68 69 71  Temp:  98.1 F (36.7 C) 97.8 F (36.6 C) 98.2 F (36.8 C)  TempSrc:  Oral Oral Oral  Resp: 14 15 14 16   Height:      Weight:   176 lb 5.9 oz (80 kg)   SpO2:  92% 92% 91%    Intake/Output Summary (Last 24 hours) at 08/09/14 0712 Last data filed at 08/08/14 1350  Gross per 24 hour  Intake 673.33 ml  Output    750 ml  Net -76.67 ml   Filed Weights   08/08/14 0543 08/09/14 0035  Weight: 180 lb (81.647 kg) 176 lb 5.9 oz (80 kg)    PHYSICAL EXAM  General: Pleasant, NAD. Neuro: Alert and oriented X 3. Moves all extremities spontaneously. Psych: Normal affect. HEENT:  Normal  Neck: Supple without bruits or JVD. Lungs:  Resp regular and unlabored, CTA. Heart: RRR no s3, s4, or murmurs. R groin cath site stable.  Abdomen: Soft, non-tender, non-distended, BS + x 4.  Extremities: No clubbing, cyanosis or edema. DP/PT/Radials 2+ and equal bilaterally.  Accessory Clinical Findings   TELE NSR with HR 60-70s    ECG  NSR with poor R wave progression in anterior lead  Echocardiogram 07/15/2014  - Left ventricle: The cavity size was normal. Wall thickness was normal. Systolic function was normal. The estimated ejection fraction was in the range of 55% to 60%. Wall motion was normal; there were no regional wall motion  abnormalities. - Left atrium: No evidence of thrombus in the atrial cavity or appendage. No spontaneous echo contrast was observed. - Right ventricle: The cavity size was mildly dilated. - Right atrium: The atrium was mildly dilated. - Atrial septum: There was a medium-sized secundum atrial septal defect. 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 Doppler showed a moderate left-to-right atrial level shunt.    ASSESSMENT AND PLAN  1. Ostium secundum ASD  - echo showed evidence of hemodynamically significant L to R shunting based on dilated RA/RV  - recent stroke was concerning for paradoxical embolism  - ASD closure indidcated in the settin og L to R shunting to prevent R sided HF and pulm HTN  - s/p device closure of atrial septal defect during 14mm amplatzer septal occluder device 08/08/2014  - continue ASA and plavix for 6 month. 1 month echo and followup visit on the same day. SBE prophylasix x 6 month. Stable for discharge.   2. Recent subacute MCA stroke  - while [place] in [place] on [place], had aphasia and L sided weakness  - did not seek immediate medical care, stroke confirmed by CT scan several days later. No  neurological residual deficit  Signed, Amedeo PlentyMeng, Hao PA-C Pager: [phone]   I seen and evaluated the patient this morning along with Azalee CourseMeng, Hao PA-C.  I agree with his findings, examination and recommendations after complete review the chart and discussion on rounds. The above recommendations were per Dr. [name].  She is to have an echocardiogram and a chest x-ray today. Pending these results, should be ready for discharge.    Marykay LexHARDING, DAVID W, M.D., M.S. Interventional Cardiologist   Pager # [phone]

## 2014-08-09 NOTE — Progress Notes (Signed)
*  PRELIMINARY RESULTS* Echocardiogram 2D Echocardiogram has been performed.  [name], [name] 08/09/2014, 11:35 AM

## 2014-08-09 NOTE — Discharge Summary (Signed)
Discharge Summary   Patient ID: [name],  MRN: [id number], DOB/AGE: November 20, 1955 59 y.o.  Admit date: 08/08/2014 Discharge date: 08/09/2014  Primary Care Provider: Kaleen Mask Primary Cardiologist: Dr. Donnie Aho Primary valvular specialist: Dr. Excell Seltzer  Discharge Diagnoses Principal Problem:   Atrial septal defect Active Problems:   CVA (cerebral infarction)   Allergies Allergies  Allergen Reactions  . Penicillins Swelling  . Cefuroxime Swelling  . Excedrin Tension Headache [Acetaminophen-Caffeine] Swelling  . Hydrocodone Nausea Only  . Codeine Nausea Only    Procedures  Echocardiogram 08/09/2014 LV EF: 55% -  60%  ------------------------------------------------------------------- History:  PMH: ASD. S/P ASD Closure. Chronic obstructive pulmonary disease.  ------------------------------------------------------------------- Study Conclusions  - Left ventricle: The cavity size was normal. Wall thickness was increased in a pattern of moderate LVH. Systolic function was normal. The estimated ejection fraction was in the range of 55% to 60%. Wall motion was normal; there were no regional wall motion abnormalities. Doppler parameters are consistent with abnormal left ventricular relaxation (grade 1 diastolic dysfunction). The E/e&' ratio is <8, suggesting normal LV filling pressure. - Left atrium: The atrium was normal in size. - Right atrium: The atrium was at the upper limits of normal in size. - Atrial septum: Thickened and echogenic c/w ASD device repair. - Tricuspid valve: There was mild regurgitation. - Pulmonary arteries: PA peak pressure: 27 mm Hg (S). - Inferior vena cava: The vessel was normal in size. The respirophasic diameter changes were in the normal range (= 50%), consistent with normal central venous pressure.  Impressions:  - Compared to a prior TEE in 07/2014, the atrial septum is thickened and echogenic,  consistent with ASD device repair. No obvious defect by color doppler was noted.    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 Balloon sizing was performed. ASD defect size: 13mm. Intracardiac echo used for procedural guidance. 14 mm Amplatzer Septal Occluder Device was used to close the defect. Agitated saline study was not performed post closure. There were no immediate procedural complications.       Hospital Course  The patient is a 59 year old female with recent onset of CVA while vacationing in [facility] [place]. She did not seek immediate medical care, CT scan obtained several days later by her PCP showed a subacute MCA stroke. She has not had significant neurological residual. 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 There was also moderate left-to-right atrial level shunt. The patient was referred to Dr. Excell Seltzer who saw the patient on 4/27. After reviewing her TEE, it was felt that she has hemodynamically significant left-to-right shunting based on dilated RA/RV. Her recent stroke risk concern for paradoxical embolism. ASD closure is indicated in the setting to prevent right-sided heart failure, pulmonary hypertension and reduce cardioembolic event risk. After discussing benefits and risk of the ASD repair, she has agreed to proceed with the procedure.  She underwent planned procedure on 08/08/2014 and had successful device closure of atrial septal defect using a 14 mm Amplatzer septal occluder device. She was seen on the following morning on 08/09/2014, at which time she was  doing well without significant discomfort.  Chest x-ray was obtained which showed no acute cardiopulmonary disease. Echocardiogram was also repeated which showed EF 55-60%, moderate LVH, grade 1 diastolic dysfunction, thickened atrial septum with echogenic consistent with ASD device repair, PA peak pressure 27 mmHg. She is deemed stable for discharge from cardiology perspective. I have arranged 1 month follow-up with Dr. [name] and obtain echocardiogram on the same day. After which she will need to follow-up with Dr. Donnie Ahoilley.   Discharge Vitals Blood pressure 109/71, pulse 74, temperature 97.9 F (36.6 C), temperature source Oral, resp. rate 14, height 5\' 6"  (1.676 m), weight 176 lb 5.9 oz (80 kg), SpO2 92 %.  Filed Weights   08/08/14 0543 08/09/14 0035  Weight: 180 lb (81.647 kg) 176 lb 5.9 oz (80 kg)    Disposition  Pt is being discharged home today in good condition.  Follow-up Plans & Appointments      Follow-up Information    Follow up with Darden PalmerILLEY JR,W SPENCER, MD.   Specialty:  Cardiology   Why:  Please followup with Dr. Donnie Ahoilley after sees Dr. [name], office will call you to arrange followup   Contact information:   [address] [place] [address] [phone]       Follow up with Tonny Bollmanooper, Michael, MD On 09/08/2014.   Specialty:  Cardiology   Why:  8:15am   Contact information:   [address] [place] [address] [phone]       [facility] [address] [facility] On 09/08/2014.   Specialty:  Cardiology   Why:  1 month outpatient echocardiogram. 8:30am   Contact information:   [address], [address] [place] [id number] [phone]      Discharge Medications    Medication List    TAKE these medications        acetaminophen 325 MG tablet  Commonly known as:  TYLENOL  Take 650 mg by mouth every 6 (six) hours as needed for headache.     aspirin EC 81 MG tablet  Take 81 mg by mouth daily.       clonazePAM 2 MG tablet  Commonly known as:  KLONOPIN  Take 2 mg by mouth at bedtime. For restless legs     clopidogrel 75 MG tablet  Commonly known as:  PLAVIX  Take 1 tablet (75 mg total) by mouth daily.     escitalopram 20 MG tablet  Commonly known as:  LEXAPRO  Take 20 mg by mouth daily.     montelukast 10 MG tablet  Commonly known as:  SINGULAIR  Take 1 tablet (10 mg total) by mouth daily.     multivitamin with minerals Tabs tablet  Take 1 tablet by mouth daily.     PROVENTIL HFA 108 (90 BASE) MCG/ACT inhaler  Generic drug:  albuterol  Inhale 2 puffs into the lungs every 4 (four) hours as needed for wheezing or shortness of breath. ProAir     sulfamethoxazole-trimethoprim 800-160 MG per tablet  Commonly known as:  BACTRIM DS,SEPTRA DS  Take 1 tablet by mouth See admin instructions. Take 1 tablet by mouth after intercourse; take 2 tablets once by mouth if experiencing symptoms of UTI     Tiotropium Bromide Monohydrate 1.25 MCG/ACT Aers  Commonly known as:  SPIRIVA RESPIMAT  Inhale 2 puffs into the lungs daily.        Outstanding Labs/Studies  1 month echo on the same of followup with Dr. [name]  Duration of  Discharge Encounter   Greater than 30 minutes including physician time.  Ramond DialSigned, Wayde Gopaul PA-C Pager: [phone] 08/09/2014, 12:56 PM

## 2014-08-09 NOTE — Discharge Instructions (Signed)
Atrial Septal Defect An atrial septal defect (ASD) is a hole in the heart. This hole is located in the thin tissue (septum) that separates the two upper chambers of the heart, the right and left atrium. This hole is present at birth (congenital). A few minutes after birth, this hole normally closes so that blood is not able to go between the right and left atrium.  Normally, blood from the right side of the heart is pumped to the lungs where the blood is oxygenated. The oxygenated blood from the lungs is then pumped to the left side of the heart. From the left side of the heart, blood is pumped out to the rest of the body. When an ASD occurs, blood from the left atrium mixes with blood in the right atrium. The blood is then recirculated to the lungs and left side of the heart. In other words, the blood makes the trip twice. An ASD makes the heart work harder by increasing the amount of blood in the right side of the heart. This causes heart overload and eventually weakens the heart's ability to pump.  CAUSES  The cause of ASD is not known. SIGNS AND SYMPTOMS  The symptoms of ASD vary depending upon the size of the hole and the amount of blood that goes into the right atrium. There may be no symptoms or symptoms may include:  Tiredness or fatigue.  Trouble breathing or shortness of breath.  Irregular heartbeats (arrhythmias).  An extra "swishing" or "whooshing" type sound (heart murmur) heard when listening to the heart. DIAGNOSIS  In order to diagnose ASD, tests will need to be performed. Some of the tests may include:  ECG. This records the electrical activity of your heart and traces the patterns of your heartbeat.  Chest X-ray exam.  MRI or CT scan.  Nuclear medicine blood flow study. This imaging test shows how much blood is being passed through the ASD.  Echocardiography. There are two types that may be used:  Transthoracic echocardiography (TTE). A TTE is very sensitive for  detecting the two most common types of ASD, ostium primum or ostium secundum. It is not as sensitive in detecting a less common form of ASD, sinus venosus.  Transesophageal echocardiography (TEE). A TEE is especially helpful in patients who have a thin or easily movable (mobile) septum, making ASD detection more accurate.  Cardiac catheterization. In this procedure, a small tube (catheter) is passed through a large vein in your neck, groin, or arm. With this test, your health care provider can visualize your heart defect, check how well your heart is pumping, and check the function of your heart valves. TREATMENT   No treatment may be required if only a small amount of blood is moving back and forth (shunting) from the left to right atrium.  Minimally invasive closure may be done depending on the type and location of the ASD. Similar to the cardiac catheterization used to diagnose an ASD, this type of procedure is done in a cardiac catheterization lab. A catheter is inserted into a large blood vessel. The catheter is advanced to the ASD in the heart. A patch resembling an umbrella is threaded up the catheter and placed in the ASD hole. The patch is then "opened up" to close off the hole.  Open heart surgery may be necessary if minimally invasive closure cannot be done. If the ASD is small, the hole can be closed with stitches. If the ASD is large, a patch is sewn  over the defect so the hole is closed. SEEK IMMEDIATE MEDICAL CARE IF:  You experience unusual fatigue when exerting yourself.  You have chest pain at rest or with exertion.  You notice your fingertips or lips turning pale or blue. MAKE SURE YOU:   Understand these instructions.   Will watch your condition.  Will get help right away if you are not doing well or get worse.  Document Released: 10/31/2003 Document Revised: 08/02/2013 Document Reviewed: 11/23/2012 [facility] Patient Information 2015 [employer] [place]. This information  is not intended to replace advice given to you by your health care provider. Make sure you discuss any questions you have with your health care provider.

## 2014-08-11 ENCOUNTER — Encounter (HOSPITAL_COMMUNITY): Payer: Self-pay | Admitting: Cardiovascular Disease

## 2014-09-08 ENCOUNTER — Other Ambulatory Visit: Payer: Self-pay | Admitting: Physician Assistant

## 2014-09-08 ENCOUNTER — Encounter: Payer: Self-pay | Admitting: Cardiovascular Disease

## 2014-09-08 ENCOUNTER — Other Ambulatory Visit: Payer: Self-pay

## 2014-09-08 ENCOUNTER — Ambulatory Visit (INDEPENDENT_AMBULATORY_CARE_PROVIDER_SITE_OTHER): Payer: BLUE CROSS/BLUE SHIELD | Admitting: Cardiovascular Disease

## 2014-09-08 ENCOUNTER — Ambulatory Visit (HOSPITAL_COMMUNITY): Payer: BLUE CROSS/BLUE SHIELD | Attending: Internal Medicine

## 2014-09-08 VITALS — BP 138/84 | HR 74 | Ht 66.0 in | Wt 189.8 lb

## 2014-09-08 DIAGNOSIS — Q211 Atrial septal defect, unspecified: Secondary | ICD-10-CM

## 2014-09-08 DIAGNOSIS — Z9889 Other specified postprocedural states: Secondary | ICD-10-CM | POA: Insufficient documentation

## 2014-09-08 DIAGNOSIS — Q2111 Secundum atrial septal defect: Secondary | ICD-10-CM

## 2014-09-08 NOTE — Patient Instructions (Signed)
Medication Instructions:  Your physician recommends that you continue on your current medications as directed. Please refer to the Current Medication list given to you today.  Labwork: No new orders.   Testing/Procedures: Your physician has requested that you have an echocardiogram with bubble study in 6 MONTHS. Echocardiography is a painless test that uses sound waves to create images of your heart. It provides your doctor with information about the size and shape of your heart and how well your heart's chambers and valves are working. This procedure takes approximately one hour. There are no restrictions for this procedure.  Follow-Up: Your physician recommends that you continue routine cardiology follow-up appointment with Dr Donnie Aho.   Any Other Special Instructions Will Be Listed Below (If Applicable).  Your physician discussed the importance of taking an antibiotic prior to any dental, gastrointestinal, genitourinary procedures to prevent damage to the heart valves from infection. You were given a prescription for an antibiotic based on current SBE prophylaxis guidelines. (This is only required for the first 6 months after closure)

## 2014-09-08 NOTE — Progress Notes (Signed)
Cardiology Office Note   Date:  09/08/2014   ID:  [name], DOB 1955/07/16, MRN [id number]  PCP:  Kaleen Mask, MD  Cardiologist:  Tonny Bollman, MD    Chief Complaint  Patient presents with  . Chest Pain     History of Present Illness: [name] is a 59 y.o. female who presents for one-month follow-up after undergoing transcatheter ASD closure 08/08/2014. This spring the patient had a stroke with aphasia and left-sided weakness. She did not seek immediate medical attention but a CAT scan of the brain several days later demonstrated a subacute PA territory stroke. Her evaluation ultimately demonstrated an ASD with RA/RV dilatation. Transcatheter closure was recommended. She was treated with a 14 mm Amplatzer septal occluder device without complications.  She continues to complain of generalized fatigue and gait unsteadiness since her stroke. She has had some chest fullness with bending forward since undergoing device closure of her atrial septal defect. She notes breathing is improved. No orthopnea, PND, or leg swelling. Palpitations or syncope. She hasn't returned to work since undergoing ASD closure.  Past Medical History  Diagnosis Date  . Asthma   . ALLERGIC RHINITIS   . PONV (postoperative nausea and vomiting)     Pt states Zofran does not relieve sx; prefers Phenergan  . Post-menopause on HRT (hormone replacement therapy)   . Restless leg   . Migraine headache     since childhood  . ADHD (attention deficit hyperactivity disorder)   . History of frequent urinary tract infections   . Claustrophobia     unable to ride in elavator;leave room door open  . Stroke   . COPD (chronic obstructive pulmonary disease)     Past Surgical History  Procedure Laterality Date  . Sinus surgery repair    . Tubel ligation    . Partial hysterectomy    . Abdominal hysterectomy    . Tubal ligation    . Bladder suspension  2001  . Anterior cervical decomp/discectomy  fusion  05/06/2012    Procedure: ANTERIOR CERVICAL DECOMPRESSION/DISCECTOMY FUSION 2 LEVELS;  Surgeon: Emilee Hero, MD;  Location: [facility] OR;  Service: Orthopedics;  Laterality: Bilateral;  Anterior cervical decompression fusion, cervical 5-6, cervical 6-7 with instrumentation and allograft.  Rhae Hammock without cardioversion N/A 07/15/2014    Procedure: TRANSESOPHAGEAL ECHOCARDIOGRAM (TEE);  Surgeon: Thurmon Fair, MD;  Location: [facility] ENDOSCOPY;  Service: Cardiovascular;  Laterality: N/A;  . Asd repair    . Cardiac catheterization N/A 08/08/2014    Procedure: ASD Closure;  Surgeon: Tonny Bollman, MD;  Location: [facility] INVASIVE CV LAB;  Service: Cardiovascular;  Laterality: N/A;    Current Outpatient Prescriptions  Medication Sig Dispense Refill  . acetaminophen (TYLENOL) 325 MG tablet Take 650 mg by mouth every 6 (six) hours as needed for headache.    . albuterol (PROVENTIL HFA) 108 (90 BASE) MCG/ACT inhaler Inhale 2 puffs into the lungs every 4 (four) hours as needed for wheezing or shortness of breath. ProAir    . aspirin EC 81 MG tablet Take 81 mg by mouth daily.    . clonazePAM (KLONOPIN) 2 MG tablet Take 2 mg by mouth at bedtime. For restless legs  0  . clopidogrel (PLAVIX) 75 MG tablet Take 1 tablet (75 mg total) by mouth daily. 90 tablet 1  . escitalopram (LEXAPRO) 20 MG tablet Take 20 mg by mouth daily.   12  . montelukast (SINGULAIR) 10 MG tablet Take 1 tablet (10 mg total) by mouth daily.  30 tablet prn  . Multiple Vitamin (MULTIVITAMIN WITH MINERALS) TABS tablet Take 1 tablet by mouth daily.    [name] sulfamethoxazole-trimethoprim (BACTRIM DS,SEPTRA DS) 800-160 MG per tablet Take 1 tablet by mouth See admin instructions. Take 1 tablet by mouth after intercourse; take 2 tablets once by mouth if experiencing symptoms of UTI  0   No current facility-administered medications for this visit.    Allergies:   Penicillins; Cefuroxime; Excedrin tension headache; Hydrocodone; and Codeine   Social  History:  The patient  reports that she quit smoking about 11 years ago. Her smoking use included Cigarettes. She has a 50 pack-year smoking history. She has never used smokeless tobacco. She reports that she does not drink alcohol or use illicit drugs.   Family History:  The patient's  family history includes Allergies in her mother; Asthma in her mother; Heart disease in her father; Lung cancer in her brother.   PHYSICAL EXAM: VS:  BP 138/84 mmHg  Pulse 74  Ht  (1.676 m)  Wt 189 lb 12.8 oz (86.093 kg)  BMI 30.65 kg/m2  SpO2 95% , BMI Body mass index is 30.65 kg/(m^2). GEN: Well nourished, well developed, in no acute distress HEENT: normal Neck: no JVD, no masses.  Cardiac: RRR without murmur or gallop                Respiratory:  clear to auscultation bilaterally, normal work of breathing GI: soft, nontender MS: no deformity or atrophy Ext: no pretibial edema Skin: warm and dry, no rash Neuro:  Strength and sensation are intact Psych: euthymic mood, full affect  EKG:  EKG is not ordered today.  Recent Labs: 06/17/2014: ALT 17 07/27/2014: BUN 22; Creatinine, Ser 0.87; Hemoglobin 13.3; Platelets 257.0; Potassium 3.6; Sodium 140   Lipid Panel  No results found for: CHOL, TRIG, HDL, CHOLHDL, VLDL, LDLCALC, LDLDIRECT    Wt Readings from Last 3 Encounters:  09/08/14 189 lb 12.8 oz (86.093 kg)  08/09/14 176 lb 5.9 oz (80 kg)  07/27/14 186 lb (84.369 kg)     Cardiac Studies Reviewed: 2D Echo personally reviewed. Atrial septal occluder device is in place. There is no shunt by color flow. There is no pericardial effusion. Formal interpretation pending.   ASSESSMENT AND PLAN: Ostium secundum ASD status post transcatheter closure: I reviewed the patients echocardiogram images. There is no effusion and her atrial septal occluder devices in appropriate position. There is no shunt by color flow. She seems to be recovering well. She can return to work without restrictions. I have  recommended a 6 month limited echocardiogram with agitated saline to rule out any residual shunting. After that the patient should have a yearly echocardiogram and this will be done at Dr. York Spaniel office with her regular cardiology follow-up. She understands to continue to follow SBE prophylaxis for the first 6 months after device placement. She otherwise has no restrictions at this point. I anticipate that we will be able to stop Plavix at 6 months.   Current medicines are reviewed with the patient today.  The patient does not have concerns regarding medicines.  Labs/ tests ordered today include:   Orders Placed This Encounter  Procedures  . ECHO BUBBLE STUDY    Disposition:   FU limited echo with bubble study to be done at 6 months. Otherwise follow-up planned with Dr Donnie Aho.  Enzo Bi, MD  09/08/2014 10:13 AM    [facility] [address] [place], [place], [place]  [address] Phone: [phone]; Fax: [phone]

## 2014-09-12 ENCOUNTER — Encounter: Payer: Self-pay | Admitting: Neurology

## 2014-09-12 ENCOUNTER — Ambulatory Visit (INDEPENDENT_AMBULATORY_CARE_PROVIDER_SITE_OTHER): Payer: BLUE CROSS/BLUE SHIELD | Admitting: Neurology

## 2014-09-12 VITALS — BP 134/82 | HR 72 | Resp 16 | Ht 66.0 in | Wt 191.6 lb

## 2014-09-12 DIAGNOSIS — Q211 Atrial septal defect: Secondary | ICD-10-CM

## 2014-09-12 DIAGNOSIS — R269 Unspecified abnormalities of gait and mobility: Secondary | ICD-10-CM

## 2014-09-12 DIAGNOSIS — G4489 Other headache syndrome: Secondary | ICD-10-CM | POA: Insufficient documentation

## 2014-09-12 DIAGNOSIS — I63311 Cerebral infarction due to thrombosis of right middle cerebral artery: Secondary | ICD-10-CM

## 2014-09-12 DIAGNOSIS — R200 Anesthesia of skin: Secondary | ICD-10-CM

## 2014-09-12 DIAGNOSIS — Q2112 Patent foramen ovale: Secondary | ICD-10-CM

## 2014-09-12 DIAGNOSIS — I69398 Other sequelae of cerebral infarction: Secondary | ICD-10-CM

## 2014-09-12 HISTORY — DX: Other sequelae of cerebral infarction: I69.398

## 2014-09-12 HISTORY — DX: Other headache syndrome: G44.89

## 2014-09-12 HISTORY — DX: Unspecified abnormalities of gait and mobility: R26.9

## 2014-09-12 MED ORDER — TOPIRAMATE 50 MG PO TABS: 50.0000 mg | ORAL_TABLET | Freq: Two times a day (BID) | ORAL | Status: DC

## 2014-09-12 NOTE — Patient Instructions (Signed)
Call us if any new or worsening neurologic symptoms

## 2014-09-12 NOTE — Progress Notes (Signed)
[facility]  PATIENT: [name] DOB: [birth year]  REFERRING DOCTOR OR PCP:  Windle Guard (PCP; phone: [phone] ) and Dr. Donnie Aho (Cardiology; fax: [phone]) SOURCE: patient, records form [facility], ED, CT images  _________________________________   HISTORICAL  CHIEF COMPLAINT:  Chief Complaint  Patient presents with  . History of cva    Sts. in conversation she sometimes has trouble finding the right words--"I know what I mean to say, but the wrong words come out of my mouth."  She had PFO closes on 08-08-14, by Dr. Tonny Bollman her in [place].   Sts. she has a hx. of migraines, h/a's were resolved for a while, but now she is having frequent h/a's that are present upon waking in the mornings. Sts. asa/tylenol helps./fim  . PFO  . 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    She was found to have a PFO and has had the closure and is on Plavix.      Her hypercoagulabile labwork was normal.    Currently, she notes that she has no more numbness.   However, she is still stumbling some with her walking and recently fell because her left foot drags sometimes.    She feels she is tipped forward as she walks.   She is waking up with a headache most days.  It is located at the vertex.     She has some photophonia and moving increases her pain.    She has mild nausea at times but not vomiting.   She is not getting visual aura since the PFO closure.   \  She feels more tired in general.    She also feels her STM Is worse.    She especially notes difficulty with people's names.   She notes she has had some verbal fluency issues and occasionally says the wrong words.          She is concerned about work. She works as a Interior and spatial designer.  She notes that her left hand is not as coordinated as it used to be and she also has difficulty standing for long period of time now  I reviewed the  images form her CT scan of the head on 06/17/2014. It shows 2 subacute strokes in related vascular territories, one in the posterior frontal lobe/anterior parietal lobe and the other in the frontal operculum with extension into the posterior limb of the internal capsule.    On 06/22/2014 she had MRI and MRA of the brain. According to notes from [facility], The MR angiogram showed "occlusion of an M2 branch of the right middle cerebral artery felt to most likely be embolic" the MRI showed subacute infarction in the right posterior insula and anterior right parietal lobe".   Those films were not available for review.  A transthoracic echocardiogram 07/04/14 with bubble contrast showed an atrial septal aneurysm with a patent foramen ovale and normal EF% = 55%.       REVIEW OF SYSTEMS: Constitutional: No fevers, chills, sweats, or change in appetite.  She has gained about 5 pounds over the last few months Eyes: No visual changes, double vision, eye pain Ear, nose and throat: No hearing loss, ear pain, nasal congestion, sore throat Cardiovascular: No chest pain, palpitations Respiratory: No shortness of breath at rest or with exertion.   No wheezes GastrointestinaI: No nausea, vomiting, diarrhea,  abdominal pain, fecal incontinence Genitourinary: No dysuria, urinary retention or frequency.  No nocturia. Musculoskeletal: No neck pain, back pain Integumentary: No rash, pruritus, skin lesions Neurological: as above Psychiatric: No depression at this time.  No anxiety Endocrine: No palpitations, diaphoresis, change in appetite, change in weigh or increased thirst Hematologic/Lymphatic: No anemia, purpura, petechiae. Allergic/Immunologic: No itchy/runny eyes, nasal congestion, recent allergic reactions, rashes  ALLERGIES: Allergies  Allergen Reactions  . Penicillins Swelling  . Cefuroxime Swelling  . Excedrin Tension Headache [Acetaminophen-Caffeine] Swelling  . Hydrocodone  Nausea Only  . Codeine Nausea Only    HOME MEDICATIONS:  Current outpatient prescriptions:  .  acetaminophen (TYLENOL) 325 MG tablet, Take 650 mg by mouth every 6 (six) hours as needed for headache., Disp: , Rfl:  .  albuterol (PROVENTIL HFA) 108 (90 BASE) MCG/ACT inhaler, Inhale 2 puffs into the lungs every 4 (four) hours as needed for wheezing or shortness of breath. ProAir, Disp: , Rfl:  .  aspirin EC 81 MG tablet, Take 81 mg by mouth daily., Disp: , Rfl:  .  ciprofloxacin (CIPRO) 500 MG tablet, , Disp: , Rfl:  .  clonazePAM (KLONOPIN) 2 MG tablet, Take 2 mg by mouth at bedtime. For restless legs, Disp: , Rfl: 0 .  clopidogrel (PLAVIX) 75 MG tablet, Take 1 tablet (75 mg total) by mouth daily., Disp: 90 tablet, Rfl: 1 .  escitalopram (LEXAPRO) 20 MG tablet, Take 20 mg by mouth daily. , Disp: , Rfl: 12 .  montelukast (SINGULAIR) 10 MG tablet, Take 1 tablet (10 mg total) by mouth daily., Disp: 30 tablet, Rfl: prn .  Multiple Vitamin (MULTIVITAMIN WITH MINERALS) TABS tablet, Take 1 tablet by mouth daily., Disp: , Rfl:   PAST MEDICAL HISTORY: Past Medical History  Diagnosis Date  . Asthma   . ALLERGIC RHINITIS   . PONV (postoperative nausea and vomiting)     Pt states Zofran does not relieve sx; prefers Phenergan  . Post-menopause on HRT (hormone replacement therapy)   . Restless leg   . Migraine headache     since childhood  . ADHD (attention deficit hyperactivity disorder)   . History of frequent urinary tract infections   . Claustrophobia     unable to ride in elavator;leave room door open  . Stroke   . COPD (chronic obstructive pulmonary disease)     PAST SURGICAL HISTORY: Past Surgical History  Procedure Laterality Date  . Sinus surgery repair    . Tubel ligation    . Partial hysterectomy    . Abdominal hysterectomy    . Tubal ligation    . Bladder suspension  2001  . Anterior cervical decomp/discectomy fusion  05/06/2012    Procedure: ANTERIOR CERVICAL  DECOMPRESSION/DISCECTOMY FUSION 2 LEVELS;  Surgeon: Emilee Hero, MD;  Location: [facility] OR;  Service: Orthopedics;  Laterality: Bilateral;  Anterior cervical decompression fusion, cervical 5-6, cervical 6-7 with instrumentation and allograft.  Rhae Hammock without cardioversion N/A 07/15/2014    Procedure: TRANSESOPHAGEAL ECHOCARDIOGRAM (TEE);  Surgeon: Thurmon Fair, MD;  Location: [facility] ENDOSCOPY;  Service: Cardiovascular;  Laterality: N/A;  . Asd repair    . Cardiac catheterization N/A 08/08/2014    Procedure: ASD Closure;  Surgeon: Tonny Bollman, MD;  Location: [facility] INVASIVE CV LAB;  Service: Cardiovascular;  Laterality: N/A;    FAMILY HISTORY: Family History  Problem Relation Age of Onset  . Lung cancer Brother   . Asthma Mother   . Allergies Mother   . Heart disease Father  cause of death    SOCIAL HISTORY:  History   Social History  . Marital Status: Married    Spouse Name: N/A  . Number of Children: 3  . Years of Education: N/A   Occupational History  . Hair Stylist    Social History Main Topics  . Smoking status: Former Smoker -- 2.00 packs/day for 25 years    Types: Cigarettes    Quit date: 04/02/2003  . Smokeless tobacco: Never Used  . Alcohol Use: No  . Drug Use: No  . Sexual Activity: Yes   Other Topics Concern  . Not on file   Social History Narrative     PHYSICAL EXAM  Filed Vitals:   09/12/14 0900  BP: 134/82  Pulse: 72  Resp: 16  Height:  (1.676 m)  Weight: 191 lb 9.6 oz (86.909 kg)    Body mass index is 30.94 kg/(m^2).   General: The patient is well-developed and well-nourished and in no acute distress  Neck: The neck is supple, no carotid bruits are noted.  The neck is nontender.  Cardiovascular: The heart has a regular rate and rhythm with a normal S1 and S2. There were no murmurs, gallops or rubs.   Neurologic Exam  Mental status: The patient is alert and oriented x 3 at the time of the examination. The patient has apparent  normal recent and remote memory, with an apparently normal attention span and concentration ability.   Speech is near normal with just a couple word finding errors/pauses.     Cranial nerves: Extraocular movements are full.   Visual fields are full.  Facial symmetry is present. There is mildly reduced left facial sensation to soft touch . Facial strength is normal.  Trapezius and sternocleidomastoid strength is normal. No dysarthria is noted.   No obvious hearing deficits are noted.  Motor:  Muscle bulk is normal.   Tone is normal. Strength is  5 / 5 in all 4 extremities.   Sensory: Sensory testing shows mild left hand/arm numbness.  Coordination: Cerebellar testing reveals good finger-nose-finger bilaterally.  Gait and station: Station is normal.   Gait is minimally wide. Tandem gait is wide..   Reflexes: Deep tendon reflexes are symmetric and normal bilaterally.     DIAGNOSTIC DATA (LABS, IMAGING, TESTING) - I reviewed patient records, labs, notes, testing and imaging myself where available.  Lab Results  Component Value Date   WBC 11.7* 07/27/2014   HGB 13.3 07/27/2014   HCT 39.8 07/27/2014   MCV 91.3 07/27/2014   PLT 257.0 07/27/2014      Component Value Date/Time   NA 140 07/27/2014 1513   K 3.6 07/27/2014 1513   CL 104 07/27/2014 1513   CO2 31 07/27/2014 1513   GLUCOSE 87 07/27/2014 1513   BUN 22 07/27/2014 1513   CREATININE 0.87 07/27/2014 1513   CALCIUM 9.2 07/27/2014 1513   PROT 6.3 06/17/2014 1601   ALBUMIN 3.6 06/17/2014 1601   AST 20 06/17/2014 1601   ALT 17 06/17/2014 1601   ALKPHOS 65 06/17/2014 1601   BILITOT 0.5 06/17/2014 1601   GFRNONAA 68* 06/17/2014 1601   GFRAA 79* 06/17/2014 1601      ASSESSMENT AND PLAN  Cerebral infarction due to thrombosis of right middle cerebral artery  Numbness  Other headache syndrome  Patent foramen ovale  Gait disturbance, post-stroke    1.   Continue Plavix and aspirin as directed by cardiology. 2.   Topamax as it might help her headaches  and also help her lose some weight. 3.   Try to be active and exercises as tolerated. 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   If the mild cognitive disturbance does not improve, consider neurocognitive testing to better characterize 4.   Return to clinic in 4 months or sooner if there are new or worsening neurologic symptoms.   In summary, Erina Hamme is a 59 year old woman who had an embolic stroke involving the partial territory of the right MCA artery.   Her main risk factor is a patent foramen ovale. We discussed that a PFO can be a significant risk factor for a stroke as it allows small clots to travel from the right side of the heart to the left side of the heart and then to the brain. I recommend that she have closure of the PFO. She informs me that she is having a transesophageal echocardiogram tomorrow to get a better visualization of the PFO and that her cardiologist also discussed closing the PFO recently. In the interim, she is to stay on aspirin as it may help to reduce her risk. I will check her blood for a hypercoagulable panel to make sure that she does not have additional risk factors that predispose her to having more strokes in the future.  I will see her back in 2 months or sooner if she has new or worsening neurologic symptoms. We will call her with the results of the blood work when they return and consider changes in medications if indicated.   Chosen Garron A. Epimenio Foot, MD, PhD 09/12/2014, 9:10 AM Certified in Neurology, Clinical Neurophysiology, Sleep Medicine, Pain Medicine and Neuroimaging  [facility] [address], [address] [place], [place] [address] [phone]

## 2014-12-23 ENCOUNTER — Ambulatory Visit: Payer: BLUE CROSS/BLUE SHIELD

## 2015-01-16 ENCOUNTER — Encounter: Payer: Self-pay | Admitting: Neurology

## 2015-01-16 ENCOUNTER — Ambulatory Visit (INDEPENDENT_AMBULATORY_CARE_PROVIDER_SITE_OTHER): Payer: BLUE CROSS/BLUE SHIELD | Admitting: Neurology

## 2015-01-16 VITALS — BP 112/74 | HR 68 | Resp 16 | Wt 169.4 lb

## 2015-01-16 DIAGNOSIS — Q211 Atrial septal defect, unspecified: Secondary | ICD-10-CM

## 2015-01-16 DIAGNOSIS — R200 Anesthesia of skin: Secondary | ICD-10-CM

## 2015-01-16 DIAGNOSIS — F418 Other specified anxiety disorders: Secondary | ICD-10-CM | POA: Insufficient documentation

## 2015-01-16 DIAGNOSIS — I69398 Other sequelae of cerebral infarction: Secondary | ICD-10-CM | POA: Diagnosis not present

## 2015-01-16 DIAGNOSIS — I63411 Cerebral infarction due to embolism of right middle cerebral artery: Secondary | ICD-10-CM

## 2015-01-16 DIAGNOSIS — G43109 Migraine with aura, not intractable, without status migrainosus: Secondary | ICD-10-CM

## 2015-01-16 DIAGNOSIS — R269 Unspecified abnormalities of gait and mobility: Secondary | ICD-10-CM | POA: Diagnosis not present

## 2015-01-16 HISTORY — DX: Other specified anxiety disorders: F41.8

## 2015-01-16 MED ORDER — TOPIRAMATE 50 MG PO TABS: 50.0000 mg | ORAL_TABLET | Freq: Two times a day (BID) | ORAL | Status: DC

## 2015-01-16 NOTE — Progress Notes (Signed)
[facility]  PATIENT: Kim SeltzerConnie B Decker DOB: [birth year]  REFERRING DOCTOR OR PCP:  Windle GuardWilson Elkins (PCP; phone: [phone] ) and Dr. Donnie Ahoilley (Cardiology; fax: [phone]) SOURCE: patient, records form [facility], ED, CT images  _________________________________   HISTORICAL  CHIEF COMPLAINT:  Chief Complaint  Patient presents with  . History of CVA    Sts. one day last week she had a 4-5 hour episode of left arm numbness, headache and chest pain.  Sts. she did not seek tx.  In general, sts. h/a's are less frequent (about 2 a month), and less severe.  Sts. she believes memory and cognition are improving/fim  . Headache    HISTORY OF PRESENT ILLNESS:  Kim RevelsConnie Decker is a 59 yo woman who had an embolic right frontal lobe CVA in March, 2016.    She was found to have a PFO and had closure 07/2014 and is on Plavix.      Her hypercoagulabile labwork was normal.    Numbness has resolved.   The balance and coordination in the left hand are both better.    She now rarely stumbles though gait is not baseline.    Headaches are doing much better since starting Topamax.   She had one severe headache since the last visit and several milder ones.   During the more severe headache, her left arm felt numb x several hours .       With the severe HA, she has some photophonia and moving increases her pain.    She also had mild nausea but not vomiting.   She is not getting visual aura since the PFO closure.   She feels better in general since the last visit with less fatigue and improved cognition.  more tired in general.  Naming is better though she still has occasional difficulty with people's names.  Verbal fluency issues are also better.          Mood is better now than earlier this year.    She is less irritable. Anxiety is also dpoing better.   Wellbutrin and Lexapro have helped and she tolerates them welI.      Data:    CT scan of the head on 06/17/2014 showed 2 subacute strokes in  related vascular territories, one in the posterior frontal lobe/anterior parietal lobe and the other in the frontal operculum with extension into the posterior limb of the internal capsule.    On 06/22/2014 she had MRI and MRA of the brain. According [facility], The MR angiogram showed "occlusion of an M2 branch of the right middle cerebral artery felt to most likely be embolic" the MRI showed subacute infarction in the right posterior insula and anterior right parietal lobe".   Those films were not available for review.  A transthoracic echocardiogram 07/04/14 with bubble contrast showed an atrial septal aneurysm with a patent foramen ovale and normal EF% = 55%.       REVIEW OF SYSTEMS: Constitutional: No fevers, chills, sweats, or change in appetite.  She has gained about 5 pounds over the last few months Eyes: No visual changes, double vision, eye pain Ear, nose and throat: No hearing loss, ear pain, nasal congestion, sore throat Cardiovascular: No chest pain, palpitations Respiratory: No shortness of breath at rest or with exertion.   No wheezes GastrointestinaI: No nausea, vomiting, diarrhea, abdominal pain, fecal incontinence Genitourinary: No dysuria, urinary retention or frequency.  No nocturia. Musculoskeletal: No neck pain, back pain Integumentary:  No rash, pruritus, skin lesions Neurological: as above Psychiatric: No depression at this time.  No anxiety Endocrine: No palpitations, diaphoresis, change in appetite, change in weigh or increased thirst Hematologic/Lymphatic: No anemia, purpura, petechiae. Allergic/Immunologic: No itchy/runny eyes, nasal congestion, recent allergic reactions, rashes  ALLERGIES: Allergies  Allergen Reactions  . Penicillins Swelling  . Cefuroxime Swelling  . Excedrin Tension Headache [Acetaminophen-Caffeine] Swelling  . Hydrocodone Nausea Only  . Codeine Nausea Only    HOME MEDICATIONS:  Current outpatient  prescriptions:  .  acetaminophen (TYLENOL) 325 MG tablet, Take 650 mg by mouth every 6 (six) hours as needed for headache., Disp: , Rfl:  .  albuterol (PROVENTIL HFA) 108 (90 BASE) MCG/ACT inhaler, Inhale 2 puffs into the lungs every 4 (four) hours as needed for wheezing or shortness of breath. ProAir, Disp: , Rfl:  .  aspirin EC 81 MG tablet, Take 81 mg by mouth daily., Disp: , Rfl:  .  clonazePAM (KLONOPIN) 2 MG tablet, Take 2 mg by mouth at bedtime. For restless legs, Disp: , Rfl: 0 .  clopidogrel (PLAVIX) 75 MG tablet, Take 1 tablet (75 mg total) by mouth daily., Disp: 90 tablet, Rfl: 1 .  escitalopram (LEXAPRO) 20 MG tablet, Take 20 mg by mouth daily. , Disp: , Rfl: 12 .  montelukast (SINGULAIR) 10 MG tablet, Take 1 tablet (10 mg total) by mouth daily., Disp: 30 tablet, Rfl: prn .  topiramate (TOPAMAX) 50 MG tablet, Take 1 tablet (50 mg total) by mouth 2 (two) times daily., Disp: 60 tablet, Rfl: 11 .  buPROPion (WELLBUTRIN XL) 150 MG 24 hr tablet, TAKE 1 TABLET BY MOUTH EVERY MORNING FOR DEPRESSION, Disp: , Rfl: 4 .  ciprofloxacin (CIPRO) 500 MG tablet, , Disp: , Rfl:  .  Multiple Vitamin (MULTIVITAMIN WITH MINERALS) TABS tablet, Take 1 tablet by mouth daily., Disp: , Rfl:   PAST MEDICAL HISTORY: Past Medical History  Diagnosis Date  . Asthma   . ALLERGIC RHINITIS   . PONV (postoperative nausea and vomiting)     Pt states Zofran does not relieve sx; prefers Phenergan  . Post-menopause on HRT (hormone replacement therapy)   . Restless leg   . Migraine headache     since childhood  . ADHD (attention deficit hyperactivity disorder)   . History of frequent urinary tract infections   . Claustrophobia     unable to ride in elavator;leave room door open  . Stroke (HCC)   . COPD (chronic obstructive pulmonary disease) (HCC)     PAST SURGICAL HISTORY: Past Surgical History  Procedure Laterality Date  . Sinus surgery repair    . Tubel ligation    . Partial hysterectomy    . Abdominal  hysterectomy    . Tubal ligation    . Bladder suspension  2001  . Anterior cervical decomp/discectomy fusion  05/06/2012    Procedure: ANTERIOR CERVICAL DECOMPRESSION/DISCECTOMY FUSION 2 LEVELS;  Surgeon: Emilee Hero, MD;  Location: [facility] OR;  Service: Orthopedics;  Laterality: Bilateral;  Anterior cervical decompression fusion, cervical 5-6, cervical 6-7 with instrumentation and allograft.  Rhae Hammock without cardioversion N/A 07/15/2014    Procedure: TRANSESOPHAGEAL ECHOCARDIOGRAM (TEE);  Surgeon: Thurmon Fair, MD;  Location[facility] ENDOSCOPY;  Service: Cardiovascular;  Laterality: N/A;  . Asd repair    . Cardiac catheterization N/A 08/08/2014    Procedure: ASD Closure;  Surgeon: Tonny Bollman, MD;  Location: [facility] INVASIVE CV LAB;  Service: Cardiovascular;  Laterality: N/A;    FAMILY HISTORY: Family History  Problem  Relation Age of Onset  . Lung cancer Brother   . Asthma Mother   . Allergies Mother   . Heart disease Father     cause of death    SOCIAL HISTORY:  Social History   Social History  . Marital Status: Married    Spouse Name: N/A  . Number of Children: 3  . Years of Education: N/A   Occupational History  . Hair Stylist    Social History Main Topics  . Smoking status: Former Smoker -- 2.00 packs/day for 25 years    Types: Cigarettes    Quit date: 04/02/2003  . Smokeless tobacco: Never Used  . Alcohol Use: No  . Drug Use: No  . Sexual Activity: Yes   Other Topics Concern  . Not on file   Social History Narrative     PHYSICAL EXAM  Filed Vitals:   01/16/15 0847  BP: 112/74  Pulse: 68  Resp: 16  Weight: 169 lb 6.4 oz (76.839 kg)    Body mass index is 27.35 kg/(m^2).   General: The patient is well-developed and well-nourished and in no acute distress  Neck: The neck is supple    Neurologic Exam  Mental status: The patient is alert and oriented x 3 at the time of the examination. The patient has apparent normal recent and remote memory, with an  apparently normal attention span and concentration ability.   Speech is near normal with just a couple word finding errors/pauses.     Cranial nerves: Extraocular movements are full.     There is mildly reduced left facial sensation to soft touch . Facial strength is normal.  Trapezius and sternocleidomastoid strength is normal. No dysarthria is noted.   No obvious hearing deficits are noted.  Motor:  Muscle bulk is normal.   Tone is normal. Strength is  5 / 5 in all 4 extremities.   Sensory: Sensory testing shows mild left hand/arm numbness.  Coordination: Cerebellar testing reveals good finger-nose-finger bilaterally.     Gait and station: Station is normal.   Gait is normal.  Tandem gait is mildly wide..   Reflexes: Deep tendon reflexes are symmetric and normal bilaterally.     DIAGNOSTIC DATA (LABS, IMAGING, TESTING) - I reviewed patient records, labs, notes, testing and imaging myself where available.  Lab Results  Component Value Date   WBC 11.7* 07/27/2014   HGB 13.3 07/27/2014   HCT 39.8 07/27/2014   MCV 91.3 07/27/2014   PLT 257.0 07/27/2014      Component Value Date/Time   NA 140 07/27/2014 1513   K 3.6 07/27/2014 1513   CL 104 07/27/2014 1513   CO2 31 07/27/2014 1513   GLUCOSE 87 07/27/2014 1513   BUN 22 07/27/2014 1513   CREATININE 0.87 07/27/2014 1513   CALCIUM 9.2 07/27/2014 1513   PROT 6.3 06/17/2014 1601   ALBUMIN 3.6 06/17/2014 1601   AST 20 06/17/2014 1601   ALT 17 06/17/2014 1601   ALKPHOS 65 06/17/2014 1601   BILITOT 0.5 06/17/2014 1601   GFRNONAA 68* 06/17/2014 1601   GFRAA 79* 06/17/2014 1601      ASSESSMENT AND PLAN  Cerebral infarction due to embolism of right middle cerebral artery (HCC)  Atrial septal defect  Migraine with aura and without status migrainosus, not intractable  Numbness  Gait disturbance, post-stroke  Depression with anxiety     1.   Continue Plavix and aspirin as directed by cardiology. 2.   Continue Topamax  for migraine.  It has also helped with weight loss 3.   Try to be active and exercises as tolerated.  4.   Return to clinic in 12 months or sooner if there are new or worsening neurologic symptoms.   Hadleigh Felber A. Epimenio Foot, MD, PhD 01/16/2015, 8:52 AM Certified in Neurology, Clinical Neurophysiology, Sleep Medicine, Pain Medicine and Neuroimaging  [facility] [address], [address] [place], [place] [address] [phone]

## 2015-02-07 ENCOUNTER — Other Ambulatory Visit: Payer: Self-pay

## 2015-02-07 DIAGNOSIS — Z1231 Encounter for screening mammogram for malignant neoplasm of breast: Secondary | ICD-10-CM

## 2015-02-21 ENCOUNTER — Ambulatory Visit: Payer: BLUE CROSS/BLUE SHIELD

## 2015-03-02 ENCOUNTER — Ambulatory Visit: Payer: BLUE CROSS/BLUE SHIELD

## 2015-03-13 ENCOUNTER — Other Ambulatory Visit: Payer: Self-pay

## 2015-03-13 ENCOUNTER — Ambulatory Visit (HOSPITAL_COMMUNITY): Payer: BLUE CROSS/BLUE SHIELD | Attending: Cardiology

## 2015-03-13 DIAGNOSIS — Q211 Atrial septal defect, unspecified: Secondary | ICD-10-CM

## 2015-03-15 ENCOUNTER — Other Ambulatory Visit: Payer: Self-pay | Admitting: Obstetrics and Gynecology

## 2015-03-15 ENCOUNTER — Other Ambulatory Visit: Payer: Self-pay

## 2015-03-16 ENCOUNTER — Ambulatory Visit
Admission: RE | Admit: 2015-03-16 | Discharge: 2015-03-16 | Disposition: A | Discharge status: Home / Self Care | Payer: BLUE CROSS/BLUE SHIELD | Source: Ambulatory Visit

## 2015-03-16 DIAGNOSIS — Z1231 Encounter for screening mammogram for malignant neoplasm of breast: Secondary | ICD-10-CM

## 2015-03-16 LAB — CYTOLOGY - PAP

## 2015-04-28 ENCOUNTER — Encounter: Payer: Self-pay | Admitting: Internal Medicine

## 2015-04-28 ENCOUNTER — Ambulatory Visit (INDEPENDENT_AMBULATORY_CARE_PROVIDER_SITE_OTHER): Payer: BLUE CROSS/BLUE SHIELD | Admitting: Internal Medicine

## 2015-04-28 ENCOUNTER — Telehealth: Payer: Self-pay | Admitting: Internal Medicine

## 2015-04-28 VITALS — BP 122/78 | HR 69 | Ht 66.0 in | Wt 175.6 lb

## 2015-04-28 DIAGNOSIS — R058 Other specified cough: Secondary | ICD-10-CM

## 2015-04-28 DIAGNOSIS — R05 Cough: Secondary | ICD-10-CM

## 2015-04-28 NOTE — Progress Notes (Signed)
07/12/11- 74 yoF former smoker with history of allergic rhinitis and asthma/COPD.  Husband here. LOV 03/28/11 c/o chest congestion, chest tightness/pressure, increased SOB, some wheezing qhs, prod cough with yellow mucusx 5-6 wks. Denies f/c/s now.  Caught cold 5 or 6 weeks ago with fever. Was treated with Z-Paks twice and a course of prednisone. 2 weeks ago saw Dr. Edd Fabian and treated with Cleocin. Still bothersome postnasal drainage without blowing or headache. Chest tight with some wheeze and shortness of breath. Substernal aching. Cough is becoming less productive but has had yellow to green mucus. No blood or swollen glands. Using Ventolin inhaler with little benefit. Dropped off of Symbicort.  05/16/14-55 yoF former smoker with history of allergic rhinitis and asthma/COPD.  Husband here. FOLLOW FOR:  breathing sometimes acting up, uses inhalers and it gets better.  Wants Prevnar 13 shot.  Pt wants to have xray to check up on COPD.  CAT Score: 11 Occasional cough with clear phlegm and some sinus drip but no recent acute infection or exacerbation. Not wheezing. She wanted update on chest x-ray because of  smoking history and concerned about COPD. She is active without limitation by dyspnea in daily life. PFT 11/02/13- mild obstructive airways disease with insignificant response to bronchodilator rec CXR- dx Emphysema centrilobular Prevnar 13 pneumonia vaccine Sample and script for Spiriva Respiclick  2 puffs once daily   Try this instead of Tudorza for comparison Singulair refill sent   04/28/2015 acute extended ov/Dayan Kreis re: cough > sob  maint rx singulair/ prn saba  Chief Complaint  Patient presents with  . Acute Visit    Pt c/o dry cough, chest tightness, wheeze at night when laying down and some SOB. Pt reports symptoms present x1 week.   cough not really better with saba / not on any gerd rx / easily confused with details of care   No obvious day to day or daytime variability or assoc  excess/ purulent sputum or mucus plugs   or cp or chest tightness, subjective wheeze or overt sinus or hb symptoms. No unusual exp hx or h/o childhood pna/ asthma or knowledge of premature birth.   Also denies any obvious fluctuation of symptoms with weather or environmental changes or other aggravating or alleviating factors except as outlined above   Current Medications, Allergies, Complete Past Medical History, Past Surgical History, Family History, and Social History were reviewed in [employer] record.  ROS  The following are not active complaints unless bolded sore throat, dysphagia, dental problems, itching, sneezing,  nasal congestion or excess/ purulent secretions, ear ache,   fever, chills, sweats, unintended wt loss, classically pleuritic or exertional cp, hemoptysis,  orthopnea pnd or leg swelling, presyncope, palpitations, abdominal pain, anorexia, nausea, vomiting, diarrhea  or change in bowel or bladder habits, change in stools or urine, dysuria,hematuria,  rash, arthralgias, visual complaints, headache, numbness, weakness or ataxia or problems with walking or coordination,  change in mood/affect or memory.             PEx  amb wf very unusual flat affect/ nad  Wt Readings from Last 3 Encounters:  04/28/15 175 lb 9.6 oz (79.652 kg)  01/16/15 169 lb 6.4 oz (76.839 kg)  09/12/14 191 lb 9.6 oz (86.909 kg)    Vital signs reviewed  HEENT: nl dentition, turbinates, and oropharynx. Nl external ear canals without cough reflex   NECK :  without JVD/Nodes/TM/ nl carotid upstrokes bilaterally   LUNGS: no acc muscle use,  Nl contour  chest which is clear to A and P bilaterally without cough on insp or exp maneuvers   CV:  RRR  no s3 or murmur or increase in P2, no edema   ABD:  soft and nontender with nl inspiratory excursion in the supine position. No bruits or organomegaly, bowel sounds nl  MS:  Nl gait/ ext warm without deformities, calf tenderness,  cyanosis or clubbing No obvious joint restrictions   SKIN: warm and dry without lesions    NEURO:  alert, approp, nl sensorium with  no motor deficits

## 2015-04-28 NOTE — Patient Instructions (Signed)
Try prilosec otc   Take 30-60 min before first meal of the day and Pepcid ac (famotidine) 20 mg one @  bedtime until cough is completely gone for at least a week without the need for cough suppression  GERD (REFLUX)  is an extremely common cause of respiratory symptoms just like yours , many times with no obvious heartburn at all.    It can be treated with medication, but also with lifestyle changes including elevation of the head of your bed (ideally with 6 inch  bed blocks),  Smoking cessation, avoidance of late meals, excessive alcohol, and avoid fatty foods, chocolate, peppermint, colas, red wine, and acidic juices such as orange juice.  NO MINT OR MENTHOL PRODUCTS SO NO COUGH DROPS  USE SUGARLESS CANDY INSTEAD (Jolley ranchers or Stover's or Life Savers) or even ice chips will also do - the key is to swallow to prevent all throat clearing. NO OIL BASED VITAMINS - use powdered substitutes.    We can refill your cough medication if we know the name

## 2015-04-28 NOTE — Telephone Encounter (Signed)
Patient wants to know if she can get a breathing treatment today.  Patient has been coughing a lot, has coughed so much her ribs are sore.  Has been having to use rescue inhaler more often.  Not able to cough up anything, just a dry, hacking cough.  Chest feels very tight, no wheezing.    Pharmacy: [facility] [address]  Allergies  Allergen Reactions  . Penicillins Swelling  . Cefuroxime Swelling  . Excedrin Tension Headache [Acetaminophen-Caffeine] Swelling  . Hydrocodone Nausea Only  . Codeine Nausea Only   Current Outpatient Prescriptions on File Prior to Visit  Medication Sig Dispense Refill  . acetaminophen (TYLENOL) 325 MG tablet Take 650 mg by mouth every 6 (six) hours as needed for headache.    . albuterol (PROVENTIL HFA) 108 (90 BASE) MCG/ACT inhaler Inhale 2 puffs into the lungs every 4 (four) hours as needed for wheezing or shortness of breath. ProAir    . aspirin EC 81 MG tablet Take 81 mg by mouth daily.    [name] buPROPion (WELLBUTRIN XL) 150 MG 24 hr tablet TAKE 1 TABLET BY MOUTH EVERY MORNING FOR DEPRESSION  4  . ciprofloxacin (CIPRO) 500 MG tablet     . clonazePAM (KLONOPIN) 2 MG tablet Take 2 mg by mouth at bedtime. For restless legs  0  . escitalopram (LEXAPRO) 20 MG tablet Take 20 mg by mouth daily.   12  . montelukast (SINGULAIR) 10 MG tablet Take 1 tablet (10 mg total) by mouth daily. 30 tablet prn  . Multiple Vitamin (MULTIVITAMIN WITH MINERALS) TABS tablet Take 1 tablet by mouth daily.    [name] topiramate (TOPAMAX) 50 MG tablet Take 1 tablet (50 mg total) by mouth 2 (two) times daily. 60 tablet 11   No current facility-administered medications on file prior to visit.

## 2015-04-28 NOTE — Telephone Encounter (Signed)
Called spoke with pt. appt scheduled to see MW this afternoon. Nothing further needed 

## 2015-04-28 NOTE — Telephone Encounter (Signed)
If TP can't see patient, then offer her Benzonatate perles 200 mg, # 30, 1 every 8 hours as needed for cough                                                             Prednisone 20 mg, # 5, 1 daily

## 2015-04-28 NOTE — Telephone Encounter (Signed)
Message sent to Dr. Maple Hudson, Dr. Maple Hudson states that he is not available today to see patient to breathing treatment and asked that I send message to TP to see if she wants to have patient come in for breathing treatment

## 2015-04-29 DIAGNOSIS — R058 Other specified cough: Secondary | ICD-10-CM | POA: Insufficient documentation

## 2015-04-29 DIAGNOSIS — R05 Cough: Secondary | ICD-10-CM | POA: Insufficient documentation

## 2015-04-29 HISTORY — DX: Other specified cough: R05.8

## 2015-04-29 NOTE — Assessment & Plan Note (Addendum)
The most common causes of chronic cough in immunocompetent adults include the following: upper airway cough syndrome (UACS), previously referred to as postnasal drip syndrome (PNDS), which is caused by variety of rhinosinus conditions; (2) asthma; (3) GERD; (4) chronic bronchitis from cigarette smoking or other inhaled environmental irritants; (5) nonasthmatic eosinophilic bronchitis; and (6) bronchiectasis.   These conditions, singly or in combination, have accounted for up to 94% of the causes of chronic cough in prospective studies.   Other conditions have constituted no >6% of the causes in prospective studies These have included bronchogenic carcinoma, chronic interstitial pneumonia, sarcoidosis, left ventricular failure, ACEI-induced cough, and aspiration from a condition associated with pharyngeal dysfunction.    Chronic cough is often simultaneously caused by more than one condition. A single cause has been found from 38 to 82% of the time, multiple causes from 18 to 62%. Multiply caused cough has been the result of three diseases up to 42% of the time.       Based on hx and exam, this is most likely:  Classic Upper airway cough syndrome, so named because it's frequently impossible to sort out how much is  CR/sinusitis with freq throat clearing (which can be related to primary GERD)   vs  causing  secondary (" extra esophageal")  GERD from wide swings in gastric pressure that occur with throat clearing, often  promoting self use of mint and menthol lozenges that reduce the lower esophageal sphincter tone and exacerbate the problem further in a cyclical fashion.   These are the same pts (now being labeled as having "irritable larynx syndrome" by some cough centers) who not infrequently have a history of having failed to tolerate ace inhibitors,  dry powder inhalers or biphosphonates or report having atypical reflux symptoms that don't respond to standard doses of PPI , and are easily confused as  having aecopd or asthma flares by even experienced allergists/ pulmonologists.   The first step is to maximize acid suppression and eliminate cyclical coughing then regroup if the cough persists.     I had an extended discussion with the patient reviewing all relevant studies completed to date and  lasting 15 to 20 minutes of a 25 minute visit    Each maintenance medication was reviewed in detail including most importantly the difference between maintenance and prns and under what circumstances the prns are to be triggered using an action plan format that is not reflected in the computer generated alphabetically organized AVS.    Please see instructions for details which were reviewed in writing and the patient given a copy highlighting the part that I personally wrote and discussed at today's ov.

## 2015-05-22 ENCOUNTER — Ambulatory Visit (INDEPENDENT_AMBULATORY_CARE_PROVIDER_SITE_OTHER): Payer: BLUE CROSS/BLUE SHIELD | Admitting: Internal Medicine

## 2015-05-22 ENCOUNTER — Encounter: Payer: Self-pay | Admitting: Internal Medicine

## 2015-05-22 VITALS — BP 108/68 | HR 66 | Ht 66.0 in | Wt 168.0 lb

## 2015-05-22 DIAGNOSIS — J3089 Other allergic rhinitis: Principal | ICD-10-CM

## 2015-05-22 DIAGNOSIS — J302 Other seasonal allergic rhinitis: Secondary | ICD-10-CM

## 2015-05-22 DIAGNOSIS — J309 Allergic rhinitis, unspecified: Secondary | ICD-10-CM

## 2015-05-22 DIAGNOSIS — J449 Chronic obstructive pulmonary disease, unspecified: Secondary | ICD-10-CM

## 2015-05-22 MED ORDER — BENZONATATE 200 MG PO CAPS: 200.0000 mg | ORAL_CAPSULE | Freq: Three times a day (TID) | ORAL | Status: DC | PRN

## 2015-05-22 MED ORDER — ALBUTEROL SULFATE HFA 108 (90 BASE) MCG/ACT IN AERS: 2.0000 | INHALATION_SPRAY | RESPIRATORY_TRACT | Status: DC | PRN

## 2015-05-22 NOTE — Patient Instructions (Signed)
Script sent for benzonatate perles for cough  You can use tussionex cough syrup, sparingly, if needed  Script sent refilling albuterol rescue inhaler  Ok to use an otc antihistamine like Claritin/ loratadine, Zyrtec/ cetirizine, Allegra/ fexofenadine if needed for allergy eyes and nose  Sample to try: Dymista nasal spray    1-2 puffs once daily at bedtime

## 2015-05-22 NOTE — Progress Notes (Signed)
07/[birth year]/11- 60 yoF former smoker with history of allergic rhinitis and asthma/COPD.  Husband here. LOV 03/28/11 c/o chest congestion, chest tightness/pressure, increased SOB, some wheezing qhs, prod cough with yellow mucusx 5-6 wks. Denies f/c/s now.  Caught cold 5 or 6 weeks ago with fever. Was treated with Z-Paks twice and a course of prednisone. 2 weeks ago saw Dr. Edd Fabian and treated with Cleocin. Still bothersome postnasal drainage without blowing or headache. Chest tight with some wheeze and shortness of breath. Substernal aching. Cough is becoming less productive but has had yellow to green mucus. No blood or swollen glands. Using Ventolin inhaler with little benefit. Dropped off of Symbicort.  05/16/14-60 yoF former smoker with history of allergic rhinitis and asthma/COPD.  Husband here. FOLLOW FOR:  breathing sometimes acting up, uses inhalers and it gets better.  Wants Prevnar 13 shot.  Pt wants to have xray to check up on COPD.  CAT Score: 11 Occasional cough with clear phlegm and some sinus drip but no recent acute infection or exacerbation. Not wheezing. She wanted update on chest x-ray because of  smoking history and concerned about COPD. She is active without limitation by dyspnea in daily life. PFT 11/02/13- mild obstructive airways disease with insignificant response to bronchodilator CXR 04/24/12- I reviewed images IMPRESSION: Pain (sic) emphysema. No edema or consolidation. Original Report Authenticated By: Bretta Bang, M.D.  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 Pt c/o dry cough, sneezing, pnd and itchy nose. Pt denies wheeze/SOB/CP/tightness.  Seen by Dr. Sherene Sires 04/28/2015 with complaint of dry cough, chest tightness, wheezing shortness of breath 1 week, treated as reflux cough Does not recognize any reflux issues. Reports nasal congestion and drainage associated with  pollen over the last 2 weeks. Residual dry cough. Dr. Jeannetta Nap gave Tussionex. She has had less cough and wheeze for several years since ASD repair.                  ROS-see HPI Constitutional:   No-   weight loss, night sweats, fevers, chills, fatigue, lassitude. HEENT:   No-  headaches, difficulty swallowing, tooth/dental problems, sore throat,       No-  sneezing, itching, ear ache,  +nasal congestion, post nasal drip,  CV:  No-   chest pain, orthopnea, PND, swelling in lower extremities, anasarca, dizziness, palpitations Resp: +  shortness of breath with exertion or at rest.              +   productive cough,  No non-productive cough,  No- coughing up of blood.              No change in color of mucus.  No wheezing.   Skin: No-   rash or lesions. GI:  No-   heartburn, indigestion, abdominal pain, nausea, vomiting, GU:  MS:  No-   joint pain or swelling.   Neuro-     nothing unusual Psych:  No- change in mood or affect. No depression or anxiety.  No memory loss.  OBJ- Physical Exam General- Alert, Oriented, Affect-appropriate, Distress- none acute Skin- rash-none, lesions- none, excoriation- none Lymphadenopathy- none Head- atraumatic            Eyes- Gross vision intact, PERRLA, conjunctivae and secretions clear            Ears- Hearing, canals-normal            Nose- + turbinate edema, no-Septal dev, mucus, polyps,  erosion, perforation             Throat- Mallampati II , mucosa cobblestoned , drainage- none, tonsils- atrophic Neck- flexible , trachea midline, no stridor , thyroid nl, carotid no bruit Chest - symmetrical excursion , unlabored           Heart/CV- RRR , no murmur , no gallop  , no rub, nl s1 s2                           - JVD- none , edema- none, stasis changes- none, varices- none           Lung- clear to P&A, wheeze- none, cough- none , dullness-none, rub- none           Chest wall-  Abd-  Br/ Gen/ Rectal- Not done, not indicated Extrem- cyanosis- none, clubbing,  none, atrophy- none, strength- nl Neuro- grossly intact to observation

## 2015-05-26 NOTE — Assessment & Plan Note (Signed)
Medicine persistent nasal stuffiness Plan-okay to use antihistamines when necessary. Try sample Dymista  nasal spray

## 2015-05-26 NOTE — Assessment & Plan Note (Signed)
Discussed management of cough. She does not think reflux is an issue. Plan-benzonatate Perles, refill albuterol with discussion. See how cough corresponds to rhinitis symptoms.

## 2015-07-12 ENCOUNTER — Other Ambulatory Visit: Payer: Self-pay | Admitting: Internal Medicine

## 2015-07-31 ENCOUNTER — Encounter (HOSPITAL_COMMUNITY): Payer: Self-pay | Admitting: Nurse Practitioner

## 2015-07-31 ENCOUNTER — Emergency Department (HOSPITAL_COMMUNITY)
Admission: EM | Admit: 2015-07-31 | Discharge: 2015-07-31 | Disposition: A | Discharge status: Home / Self Care | Payer: BLUE CROSS/BLUE SHIELD | Attending: Emergency Medicine | Admitting: Emergency Medicine

## 2015-07-31 ENCOUNTER — Emergency Department (HOSPITAL_COMMUNITY): Payer: BLUE CROSS/BLUE SHIELD

## 2015-07-31 DIAGNOSIS — R079 Chest pain, unspecified: Secondary | ICD-10-CM

## 2015-07-31 DIAGNOSIS — Z79899 Other long term (current) drug therapy: Secondary | ICD-10-CM | POA: Insufficient documentation

## 2015-07-31 DIAGNOSIS — Z87891 Personal history of nicotine dependence: Secondary | ICD-10-CM | POA: Insufficient documentation

## 2015-07-31 DIAGNOSIS — Z7982 Long term (current) use of aspirin: Secondary | ICD-10-CM | POA: Insufficient documentation

## 2015-07-31 DIAGNOSIS — Z8673 Personal history of transient ischemic attack (TIA), and cerebral infarction without residual deficits: Secondary | ICD-10-CM | POA: Insufficient documentation

## 2015-07-31 DIAGNOSIS — G2581 Restless legs syndrome: Secondary | ICD-10-CM | POA: Diagnosis not present

## 2015-07-31 DIAGNOSIS — Z79890 Hormone replacement therapy (postmenopausal): Secondary | ICD-10-CM | POA: Insufficient documentation

## 2015-07-31 DIAGNOSIS — Z9889 Other specified postprocedural states: Secondary | ICD-10-CM | POA: Diagnosis not present

## 2015-07-31 DIAGNOSIS — G43909 Migraine, unspecified, not intractable, without status migrainosus: Secondary | ICD-10-CM | POA: Diagnosis not present

## 2015-07-31 DIAGNOSIS — Z8659 Personal history of other mental and behavioral disorders: Secondary | ICD-10-CM | POA: Diagnosis not present

## 2015-07-31 DIAGNOSIS — Z880 Allergy status to penicillin: Secondary | ICD-10-CM | POA: Diagnosis not present

## 2015-07-31 DIAGNOSIS — J449 Chronic obstructive pulmonary disease, unspecified: Secondary | ICD-10-CM | POA: Diagnosis not present

## 2015-07-31 DIAGNOSIS — Z87440 Personal history of urinary (tract) infections: Secondary | ICD-10-CM | POA: Diagnosis not present

## 2015-07-31 LAB — BASIC METABOLIC PANEL
Anion gap: 8 (ref 5–15)
BUN: 17 mg/dL (ref 6–20)
CO2: 26 mmol/L (ref 22–32)
CREATININE: 1.1 mg/dL — AB (ref 0.44–1.00)
Calcium: 8.9 mg/dL (ref 8.9–10.3)
Chloride: 106 mmol/L (ref 101–111)
GFR calc non Af Amer: 55 mL/min — ABNORMAL LOW (ref >60)
Glucose, Bld: 91 mg/dL (ref 65–99)
POTASSIUM: 3.8 mmol/L (ref 3.5–5.1)
Sodium: 140 mmol/L (ref 135–145)

## 2015-07-31 LAB — CBC
HEMATOCRIT: 41 % (ref 36.0–46.0)
HEMOGLOBIN: 13.3 g/dL (ref 12.0–15.0)
MCH: 30.4 pg (ref 26.0–34.0)
MCHC: 32.4 g/dL (ref 30.0–36.0)
MCV: 93.6 fL (ref 78.0–100.0)
PLATELETS: 253 10*3/uL (ref 150–400)
RBC: 4.38 MIL/uL (ref 3.87–5.11)
RDW: 13.3 % (ref 11.5–15.5)
WBC: 7.2 10*3/uL (ref 4.0–10.5)

## 2015-07-31 LAB — I-STAT TROPONIN, ED: Troponin i, poc: 0 ng/mL (ref 0.00–0.08)

## 2015-07-31 NOTE — ED Notes (Signed)
Pt c/o onset CP while pulling weeds today. She denies sob, nausea, dizziness. She did have some belching with the pain. She took aspirin with no change in pain. She is alert and breathing easily

## 2015-07-31 NOTE — Discharge Instructions (Signed)
Nonspecific Chest Pain  °Chest pain can be caused by many different conditions. There is always a chance that your pain could be related to something serious, such as a heart attack or a blood clot in your lungs. Chest pain can also be caused by conditions that are not life-threatening. If you have chest pain, it is very important to follow up with your health care provider. °CAUSES  °Chest pain can be caused by: °· Heartburn. °· Pneumonia or bronchitis. °· Anxiety or stress. °· Inflammation around your heart (pericarditis) or lung (pleuritis or pleurisy). °· A blood clot in your lung. °· A collapsed lung (pneumothorax). It can develop suddenly on its own (spontaneous pneumothorax) or from trauma to the chest. °· Shingles infection (varicella-zoster virus). °· Heart attack. °· Damage to the bones, muscles, and cartilage that make up your chest wall. This can include: °¨ Bruised bones due to injury. °¨ Strained muscles or cartilage due to frequent or repeated coughing or overwork. °¨ Fracture to one or more ribs. °¨ Sore cartilage due to inflammation (costochondritis). °RISK FACTORS  °Risk factors for chest pain may include: °· Activities that increase your risk for trauma or injury to your chest. °· Respiratory infections or conditions that cause frequent coughing. °· Medical conditions or overeating that can cause heartburn. °· Heart disease or family history of heart disease. °· Conditions or health behaviors that increase your risk of developing a blood clot. °· Having had chicken pox (varicella zoster). °SIGNS AND SYMPTOMS °Chest pain can feel like: °· Burning or tingling on the surface of your chest or deep in your chest. °· Crushing, pressure, aching, or squeezing pain. °· Dull or sharp pain that is worse when you move, cough, or take a deep breath. °· Pain that is also felt in your back, neck, shoulder, or arm, or pain that spreads to any of these areas. °Your chest pain may come and go, or it may stay  constant. °DIAGNOSIS °Lab tests or other studies may be needed to find the cause of your pain. Your health care provider may have you take a test called an ambulatory ECG (electrocardiogram). An ECG records your heartbeat patterns at the time the test is performed. You may also have other tests, such as: °· Transthoracic echocardiogram (TTE). During echocardiography, sound waves are used to create a picture of all of the heart structures and to look at how blood flows through your heart. °· Transesophageal echocardiogram (TEE). This is a more advanced imaging test that obtains images from inside your body. It allows your health care provider to see your heart in finer detail. °· Cardiac monitoring. This allows your health care provider to monitor your heart rate and rhythm in real time. °· Holter monitor. This is a portable device that records your heartbeat and can help to diagnose abnormal heartbeats. It allows your health care provider to track your heart activity for several days, if needed. °· Stress tests. These can be done through exercise or by taking medicine that makes your heart beat more quickly. °· Blood tests. °· Imaging tests. °TREATMENT  °Your treatment depends on what is causing your chest pain. Treatment may include: °· Medicines. These may include: °¨ Acid blockers for heartburn. °¨ Anti-inflammatory medicine. °¨ Pain medicine for inflammatory conditions. °¨ Antibiotic medicine, if an infection is present. °¨ Medicines to dissolve blood clots. °¨ Medicines to treat coronary artery disease. °· Supportive care for conditions that do not require medicines. This may include: °¨ Resting. °¨ Applying heat   or cold packs to injured areas. °¨ Limiting activities until pain decreases. °HOME CARE INSTRUCTIONS °· If you were prescribed an antibiotic medicine, finish it all even if you start to feel better. °· Avoid any activities that bring on chest pain. °· Do not use any tobacco products, including  cigarettes, chewing tobacco, or electronic cigarettes. If you need help quitting, ask your health care provider. °· Do not drink alcohol. °· Take medicines only as directed by your health care provider. °· Keep all follow-up visits as directed by your health care provider. This is important. This includes any further testing if your chest pain does not go away. °· If heartburn is the cause for your chest pain, you may be told to keep your head raised (elevated) while sleeping. This reduces the chance that acid will go from your stomach into your esophagus. °· Make lifestyle changes as directed by your health care provider. These may include: °¨ Getting regular exercise. Ask your health care provider to suggest some activities that are safe for you. °¨ Eating a heart-healthy diet. A registered dietitian can help you to learn healthy eating options. °¨ Maintaining a healthy weight. °¨ Managing diabetes, if necessary. °¨ Reducing stress. °SEEK MEDICAL CARE IF: °· Your chest pain does not go away after treatment. °· You have a rash with blisters on your chest. °· You have a fever. °SEEK IMMEDIATE MEDICAL CARE IF:  °· Your chest pain is worse. °· You have an increasing cough, or you cough up blood. °· You have severe abdominal pain. °· You have severe weakness. °· You faint. °· You have chills. °· You have sudden, unexplained chest discomfort. °· You have sudden, unexplained discomfort in your arms, back, neck, or jaw. °· You have shortness of breath at any time. °· You suddenly start to sweat, or your skin gets clammy. °· You feel nauseous or you vomit. °· You suddenly feel light-headed or dizzy. °· Your heart begins to beat quickly, or it feels like it is skipping beats. °These symptoms may represent a serious problem that is an emergency. Do not wait to see if the symptoms will go away. Get medical help right away. Call your local emergency services (911 in the [place] Do not drive yourself to the hospital. °  °This  information is not intended to replace advice given to you by your health care provider. Make sure you discuss any questions you have with your health care provider. °  °Document Released: 12/26/2004 Document Revised: 04/08/2014 Document Reviewed: 10/22/2013 °[employer] Interactive Patient Education ©2016 [employer] ° °

## 2015-07-31 NOTE — ED Provider Notes (Signed)
CSN: [id number]     Arrival date & time 07/31/15  1849 History   First MD Initiated Contact with Patient 07/31/15 2159     Chief Complaint  Patient presents with  . 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 The past was constant for one hour before it resolved. It has not recurred in the past 4 1/2 hours since it stopped. No SOB, nausea, dizziness. No history of ischemic heart disease. She took aspirin for the pain and when it did not readily resolve, she came to the emergency department for further evaluation. She reports the pain stopped after checking into the ED. No history of similar symptoms.   Patient is a 60 y.o. female presenting with chest pain. The history is provided by the patient. No language interpreter was used.  Chest Pain Pain location:  L chest Pain quality: aching   Pain radiates to:  Does not radiate Pain radiates to the back: no   Pain severity:  Moderate Associated symptoms: no abdominal pain, no fever, no nausea, no palpitations, no shortness of breath, not vomiting and no weakness     Past Medical History  Diagnosis Date  . Asthma   . ALLERGIC RHINITIS   . PONV (postoperative nausea and vomiting)     Pt states Zofran does not relieve sx; prefers Phenergan  . Post-menopause on HRT (hormone replacement therapy)   . Restless leg   . Migraine headache     since childhood  . ADHD (attention deficit hyperactivity disorder)   . History of frequent urinary tract infections   . Claustrophobia     unable to ride in elavator;leave room door open  . Stroke (HCC)   . COPD (chronic obstructive pulmonary disease) [facility])    Past Surgical History  Procedure Laterality Date  . Sinus  surgery repair    . Tubel ligation    . Partial hysterectomy    . Abdominal hysterectomy    . Tubal ligation    . Bladder suspension  2001  . Anterior cervical decomp/discectomy fusion  05/06/2012    Procedure: ANTERIOR CERVICAL DECOMPRESSION/DISCECTOMY FUSION 2 LEVELS;  Surgeon: Emilee Hero, MD;  Location: [facility] OR;  Service: Orthopedics;  Laterality: Bilateral;  Anterior cervical decompression fusion, cervical 5-6, cervical 6-7 with instrumentation and allograft.  Rhae Hammock without cardioversion N/A 07/15/2014    Procedure: TRANSESOPHAGEAL ECHOCARDIOGRAM (TEE);  Surgeon: Thurmon Fair, MD;  Location: [facility] ENDOSCOPY;  Service: Cardiovascular;  Laterality: N/A;  . Asd repair    . Cardiac catheterization N/A 08/08/2014    Procedure: ASD Closure;  Surgeon: Tonny Bollman, MD;  Location: [facility] INVASIVE CV LAB;  Service: Cardiovascular;  Laterality: N/A;  . Amplatzer septal occluder  07/2014   Family History  Problem Relation Age of Onset  . Lung cancer Brother   . Asthma Mother   . Allergies Mother   . Heart disease Father     cause of death   Social History  Substance Use Topics  . Smoking status: Former Smoker -- 2.00 packs/day for 25 years    Types: Cigarettes    Quit date: 04/02/2003  . Smokeless tobacco: Never Used  . Alcohol Use: No   OB History    No data available     Review of Systems  Constitutional:  Negative for fever.  Respiratory: Negative for shortness of breath.   Cardiovascular: Positive for chest pain. Negative for palpitations.  Gastrointestinal: Negative for nausea, vomiting and abdominal pain.  Musculoskeletal: Negative for myalgias.  Neurological: Negative for weakness.      Allergies  Penicillins; Cefuroxime; Excedrin tension headache; Hydrocodone; and Codeine  Home Medications   Prior to Admission medications   Medication Sig Start Date End Date Taking? Authorizing Provider  acetaminophen (TYLENOL) 325 MG tablet Take 650 mg by mouth every 6 (six)  hours as needed for headache.    Historical Provider, MD  albuterol (PROVENTIL HFA) 108 (90 Base) MCG/ACT inhaler Inhale 2 puffs into the lungs every 4 (four) hours as needed for wheezing or shortness of breath. ProAir 05/22/15   Waymon Budgelinton D Young, MD  aspirin EC 81 MG tablet Take 81 mg by mouth daily.    Historical Provider, MD  benzonatate (TESSALON) 200 MG capsule Take 1 capsule (200 mg total) by mouth 3 (three) times daily as needed for cough. 05/22/15   Waymon Budgelinton D Young, MD  buPROPion (WELLBUTRIN XL) 150 MG 24 hr tablet TAKE 1 TABLET BY MOUTH EVERY MORNING FOR DEPRESSION 11/01/14   Historical Provider, MD  clonazePAM (KLONOPIN) 2 MG tablet Take 2 mg by mouth at bedtime. For restless legs 07/07/14   Historical Provider, MD  escitalopram (LEXAPRO) 20 MG tablet Take 20 mg by mouth daily.  06/20/14   Historical Provider, MD  montelukast (SINGULAIR) 10 MG tablet TAKE 1 TABLET (10 MG TOTAL) BY MOUTH DAILY. 07/12/15   Waymon Budgelinton D Young, MD  Multiple Vitamin (MULTIVITAMIN WITH MINERALS) TABS tablet Take 1 tablet by mouth daily.    Historical Provider, MD  topiramate (TOPAMAX) 50 MG tablet Take 1 tablet (50 mg total) by mouth 2 (two) times daily. 01/16/15   Asa Lenteichard A Sater, MD   BP 122/72 mmHg  Pulse 55  Temp(Src) 97.8 F (36.6 C) (Oral)  Resp 16  Ht 5\' 6"  (1.676 m)  Wt 77.111 kg  BMI 27.45 kg/m2  SpO2 95% Physical Exam  Constitutional: She is oriented to person, place, and time. She appears well-developed and well-nourished.  HENT:  Head: Normocephalic.  Eyes: Conjunctivae are normal.  Neck: Normal range of motion. Neck supple.  Cardiovascular: Normal rate and regular rhythm.   No murmur heard. Pulmonary/Chest: Effort normal and breath sounds normal. She has no wheezes. She has no rales. She exhibits no tenderness.  Abdominal: Soft. Bowel sounds are normal. There is no tenderness. There is no rebound and no guarding.  Musculoskeletal: Normal range of motion.  Neurological: She is alert and oriented to  person, place, and time.  Skin: Skin is warm and dry. No rash noted.  Psychiatric: She has a normal mood and affect.    ED Course  Procedures (including critical care time) Labs Review Labs Reviewed  BASIC METABOLIC PANEL - Abnormal; Notable for the following:    Creatinine, Ser 1.10 (*)    GFR calc non Af Amer 55 (*)    All other components within normal limits  CBC  I-STAT TROPOININ, ED   Results for orders placed or performed during the hospital encounter of 07/31/15  Basic metabolic panel  Result Value Ref Range   Sodium 140 135 - 145 mmol/L   Potassium 3.8 3.5 - 5.1 mmol/L   Chloride 106 101 - 111 mmol/L   CO2 26 22 - 32 mmol/L   Glucose, Bld 91 65 - 99 mg/dL   BUN 17 6 - 20 mg/dL  Creatinine, Ser 1.10 (H) 0.44 - 1.00 mg/dL   Calcium 8.9 8.9 - 16.1 mg/dL   GFR calc non Af Amer 55 (L) >60 mL/min   GFR calc Af Amer >60 >60 mL/min   Anion gap 8 5 - 15  CBC  Result Value Ref Range   WBC 7.2 4.0 - 10.5 K/uL   RBC 4.38 3.87 - 5.11 MIL/uL   Hemoglobin 13.3 12.0 - 15.0 g/dL   HCT 09.6 04.5 - 40.9 %   MCV 93.6 78.0 - 100.0 fL   MCH 30.4 26.0 - 34.0 pg   MCHC 32.4 30.0 - 36.0 g/dL   RDW 81.1 91.4 - 78.2 %   Platelets 253 150 - 400 K/uL  I-stat troponin, ED  Result Value Ref Range   Troponin i, poc 0.00 0.00 - 0.08 ng/mL   Comment 3            Imaging Review Dg Chest 2 View  07/31/2015  CLINICAL DATA:  Left-sided chest pain today. EXAM: CHEST  2 VIEW COMPARISON:  08/09/2014. FINDINGS: The cardiac silhouette, mediastinal and hilar contours are within normal limits and stable. Mild tortuosity and calcification of the thoracic aorta. The lungs are clear. No pleural effusion. No pneumothorax. ASD closure device noted. The bony thorax is intact. IMPRESSION: No acute cardiopulmonary findings. Electronically Signed   By: Rudie Meyer M.D.   On: 07/31/2015 19:36   I have personally reviewed and evaluated these images and lab results as part of my medical decision-making.   EKG  Interpretation   Date/Time:  Monday Jul 31 2015 18:56:29 EDT Ventricular Rate:  66 PR Interval:  188 QRS Duration: 104 QT Interval:  388 QTC Calculation: 406 R Axis:   84 Text Interpretation:  Normal sinus rhythm Incomplete right bundle branch  block Borderline ECG since last tracing no significant change Confirmed by  Effie Shy  MD, ELLIOTT ([id number]) on 07/31/2015 9:49:25 PM      MDM   Final diagnoses:  None    1. Nonspecific chest pain  The patient presents with chest pan while doing yard work earlier, which lasted about an hour before resolving. She has been pain free for several hours. No history of ischemic heart disease, no family history, negative troponin, unchanged EKG, normal CXR. Patient is felt stable for discharge home without need for further evaluation. Discussed return precautions.     Elpidio Anis, PA-C 08/01/15 3086  Mancel Bale, MD 08/01/15 [phone]

## 2015-08-17 ENCOUNTER — Encounter: Payer: Self-pay | Admitting: Neurology

## 2015-08-17 ENCOUNTER — Ambulatory Visit (INDEPENDENT_AMBULATORY_CARE_PROVIDER_SITE_OTHER): Payer: BLUE CROSS/BLUE SHIELD | Admitting: Neurology

## 2015-08-17 VITALS — BP 116/78 | HR 79 | Ht 66.0 in | Wt 172.0 lb

## 2015-08-17 DIAGNOSIS — G43109 Migraine with aura, not intractable, without status migrainosus: Secondary | ICD-10-CM

## 2015-08-17 DIAGNOSIS — I63411 Cerebral infarction due to embolism of right middle cerebral artery: Secondary | ICD-10-CM | POA: Diagnosis not present

## 2015-08-17 DIAGNOSIS — G4441 Drug-induced headache, not elsewhere classified, intractable: Secondary | ICD-10-CM | POA: Diagnosis not present

## 2015-08-17 DIAGNOSIS — G4440 Drug-induced headache, not elsewhere classified, not intractable: Secondary | ICD-10-CM

## 2015-08-17 MED ORDER — TOPIRAMATE 50 MG PO TABS: 75.0000 mg | ORAL_TABLET | Freq: Two times a day (BID) | ORAL | Status: DC

## 2015-08-17 NOTE — Progress Notes (Signed)
NEUROLOGY CONSULTATION NOTE  [name] MRN: [id number] DOB: [birth year]  Referring provider: Dr. Jeannetta Nap Primary care provider: Dr. 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  History obtained by patient, cardiologist and prior neurologist's notes.  Echo report reviewed.  Onset:  She had an embolic right frontal ischemic stroke in March 2016, which was demonstrated on MRI of brain.  MRA showed occlusion of right M2 branch, felt to be embolic.  She was found to have a PFO with atrial septal aneurysm, which was subsequently closed.  EF was 55%.  DVT was not found.  She had been taking Plavix but now just ASA  daily.  She is not on a statin.  Headaches started soon after the stroke.  MRI of brain from 07/17/15, which was personally reviewed, again revealed remote right insular and parietal lobe infarcts but nothing acute.  Location:  Bi-frontal She has a constant headache but fluctuates in intensity.  When she has a severe headache: Quality:  pounding Intensity:  10/10 Aura:  Only once she saw a bright light Prodrome:  no Associated symptoms:  no Duration:  3 to 4 hours Frequency:  3 to 4 days per week Triggers/exacerbating factors:  Bending over Relieving factors:  Rubbing temples Activity:  Lays down  Past NSAIDS:  no Past analgesics:  Excedrin (side effects) Past abortive triptans:  contraindicated Past antihypertensive medications:  no Past antidepressant medications:  Bupropion, amitriptyline (side effects) Past anticonvulsant medications:  no Past vitamins/Herbal/Supplements: no  Current NSAIDS:  ibuprofen Current analgesics:  Tylenol (she takes ibuprofen or tylenol daily) Current triptans:  contraindicated Current anti-emetic:  no Current muscle relaxants:  no Current anti-anxiolytic:   clonazepam  at bedtime Current sleep aide:  clonazepam  Current Antihypertensive medications:  no Current Antidepressant medications:  lexapro  Current Anticonvulsant medications:  topiramate  twice daily Current Vitamins/Herbal/Supplements:  MVI Current Antihistamines/Decongestants:  no Other therapy:  no Other medication:  ASA   Caffeine:  1 cup coffee daily, tea Alcohol:  no Smoker:  no Diet:  okay Exercise:  no Depression/stress:  Some stress and depression since stroke.  Fears another stroke Sleep hygiene:  good  She used to work at a [employer].  She has been on disability since the stroke.   PAST MEDICAL HISTORY: Past Medical History  Diagnosis Date  . Asthma   . ALLERGIC RHINITIS   . PONV (postoperative nausea and vomiting)     Pt states Zofran does not relieve sx; prefers Phenergan  . Post-menopause on HRT (hormone replacement therapy)   . Restless leg   . Migraine headache     since childhood  . ADHD (attention deficit hyperactivity disorder)   . History of frequent urinary tract infections   . Claustrophobia     unable to ride in elavator;leave room door open  . Stroke (HCC)   . COPD (chronic obstructive pulmonary disease) (HCC)     PAST SURGICAL HISTORY: Past Surgical History  Procedure Laterality Date  . Sinus surgery repair    . Tubel ligation    . Partial hysterectomy    . Abdominal hysterectomy    . Tubal ligation    . Bladder suspension  2001  . Anterior cervical decomp/discectomy fusion  05/06/2012    Procedure: ANTERIOR CERVICAL DECOMPRESSION/DISCECTOMY FUSION 2 LEVELS;  Surgeon: Jeffie Pollock  Dumonski, MD;  Location: [facility] OR;  Service: Orthopedics;  Laterality: Bilateral;  Anterior cervical decompression fusion, cervical 5-6, cervical 6-7 with instrumentation and allograft.  Rhae Hammock without cardioversion N/A 07/15/2014    Procedure: TRANSESOPHAGEAL ECHOCARDIOGRAM (TEE);  Surgeon: Thurmon Fair, MD;  Location: [facility] ENDOSCOPY;  Service:  Cardiovascular;  Laterality: N/A;  . Asd repair    . Cardiac catheterization N/A 08/08/2014    Procedure: ASD Closure;  Surgeon: Tonny Bollman, MD;  [facility] INVASIVE CV LAB;  Service: Cardiovascular;  Laterality: N/A;  . Amplatzer septal occluder  07/2014    MEDICATIONS: Current Outpatient Prescriptions on File Prior to Visit  Medication Sig Dispense Refill  . acetaminophen (TYLENOL) 325 MG tablet Take 650 mg by mouth every 6 (six) hours as needed for headache.    . albuterol (PROVENTIL HFA) 108 (90 Base) MCG/ACT inhaler Inhale 2 puffs into the lungs every 4 (four) hours as needed for wheezing or shortness of breath. ProAir 18 g 12  . aspirin EC 81 MG tablet Take 81 mg by mouth daily.    . clonazePAM (KLONOPIN) 2 MG tablet Take 2 mg by mouth at bedtime. For restless legs  0  . escitalopram (LEXAPRO) 20 MG tablet Take 20 mg by mouth daily.   12  . montelukast (SINGULAIR) 10 MG tablet TAKE 1 TABLET (10 MG TOTAL) BY MOUTH DAILY. 30 tablet 5  . Multiple Vitamin (MULTIVITAMIN WITH MINERALS) TABS tablet Take 1 tablet by mouth daily.     No current facility-administered medications on file prior to visit.    ALLERGIES: Allergies  Allergen Reactions  . Penicillins Swelling  . Cefuroxime Swelling  . Excedrin Tension Headache [Acetaminophen-Caffeine] Swelling  . Hydrocodone Nausea Only  . Codeine Nausea Only    FAMILY HISTORY: Family History  Problem Relation Age of Onset  . Lung cancer Brother   . Asthma Mother   . Allergies Mother   . Heart disease Father     cause of death    SOCIAL HISTORY: Social History   Social History  . Marital Status: Married    Spouse Name: N/A  . Number of Children: 3  . Years of Education: N/A   Occupational History  . Hair Stylist    Social History Main Topics  . Smoking status: Former Smoker -- 2.00 packs/day for 25 years    Types: Cigarettes    Quit date: 04/02/2003  . Smokeless tobacco: Never Used  . Alcohol Use: No  . Drug Use: No   . Sexual Activity: Yes   Other Topics Concern  . Not on file   Social History Narrative    REVIEW OF SYSTEMS: Constitutional: No fevers, chills, or sweats, no generalized fatigue, change in appetite Eyes: No visual changes, double vision, eye pain Ear, nose and throat: No hearing loss, ear pain, nasal congestion, sore throat Cardiovascular: No chest pain, palpitations Respiratory:  No shortness of breath at rest or with exertion, wheezes GastrointestinaI: No nausea, vomiting, diarrhea, abdominal pain, fecal incontinence Genitourinary:  No dysuria, urinary retention or frequency Musculoskeletal:  No neck pain, back pain Integumentary: No rash, pruritus, skin lesions Neurological: as above Psychiatric: No depression, insomnia, anxiety Endocrine: No palpitations, fatigue, diaphoresis, mood swings, change in appetite, change in weight, increased thirst Hematologic/Lymphatic:  No purpura, petechiae. Allergic/Immunologic: no itchy/runny eyes, nasal congestion, recent allergic reactions, rashes  PHYSICAL EXAM: Filed Vitals:   08/17/15 1311  BP: 116/78  Pulse: 79   General: No acute distress.  Patient appears well-groomed.  Head:  Normocephalic/atraumatic Eyes:  fundi examined but not visualized Neck: supple, no paraspinal tenderness, full range of motion Back: No paraspinal tenderness Heart: regular rate and rhythm Lungs: Clear to auscultation bilaterally. Vascular: No carotid bruits. Neurological Exam: Mental status: alert and oriented to person, place, and time, recent and remote memory intact, fund of knowledge intact, attention and concentration intact, speech fluent and not dysarthric, language intact. Cranial nerves: CN I: not tested CN II: pupils equal, round and reactive to light, visual fields intact CN III, IV, VI:  full range of motion, no nystagmus, no ptosis CN V: facial sensation intact CN VII: upper and lower face symmetric CN VIII: hearing intact CN IX, X:  gag intact, uvula midline CN XI: sternocleidomastoid and trapezius muscles intact CN XII: tongue midline Bulk & Tone: normal, no fasciculations. Motor:  5/5 throughout  Sensation:  Pinprick and vibration sensation intact. Deep Tendon Reflexes:  2+ throughout, toes downgoing.  Finger to nose testing:  Without dysmetria.  Heel to shin:  Without dysmetria.  Gait:  Normal station and stride.  Able to turn and tandem walk. Romberg negative.  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  Try to limit at most 2 days per week Consider supplements:  Magnesium citrate, riboflavin, coenzyme Q10 ASA 81mg  daily for secondary stroke prevention Check fasting lipid panel. She is to call in 4 weeks with update and we can adjust topamax if needed. Follow up in 3 to 4 months.  Thank you for allowing me to take part in the care of this patient.  Shon MilletAdam Jaffe, DO  CC:  Windle GuardWilson Elkins, MD

## 2015-08-17 NOTE — Patient Instructions (Addendum)
Migraine Recommendations: 1.  Increase topiramate to 75mg  daily (take 1 and 1/2 tablets twice daily).  Call in 4 weeks with update and we can adjust dose if needed. 2.   Stop Tylenol and ibuprofen if possible or at least limit use of pain relievers to no more than 2 days out of the week.  This will help reduce risk of rebound headaches. 4.  Be aware of common food triggers such as processed sweets, processed foods with nitrites (such as deli meat, hot dogs, sausages), foods with MSG, alcohol (such as wine), chocolate, certain cheeses, certain fruits (dried fruits, some citrus fruit), vinegar, diet soda. 4.  Avoid caffeine 5.  Routine exercise 6.  Proper sleep hygiene 7.  Stay adequately hydrated with water 8.  Keep a headache diary. 9.  Maintain proper stress management. 10.  Do not skip meals. 11.  Consider supplements:  Magnesium oxide 400mg  to 600mg  daily, riboflavin 400mg , Coenzyme Q 10 100mg  three times daily  For stroke: 1.  Continue aspirin daily 2.  You should probably be on cholesterol medication to help prevent another stroke.  Will check fasting lipid panel.  CONTACT ME IN 4 WEEKS WITH UPDATE. FOLLOW UP IN 3 TO 4 MONTHS

## 2015-08-17 NOTE — Progress Notes (Signed)
Chart forwarded.  

## 2015-09-05 ENCOUNTER — Other Ambulatory Visit (INDEPENDENT_AMBULATORY_CARE_PROVIDER_SITE_OTHER): Payer: BLUE CROSS/BLUE SHIELD

## 2015-09-05 ENCOUNTER — Telehealth: Payer: Self-pay | Admitting: Neurology

## 2015-09-05 DIAGNOSIS — I63411 Cerebral infarction due to embolism of right middle cerebral artery: Secondary | ICD-10-CM | POA: Diagnosis not present

## 2015-09-05 LAB — LIPID PANEL
CHOL/HDL RATIO: 5
Cholesterol: 200 mg/dL (ref 0–200)
HDL: 37.1 mg/dL — AB (ref >39.00)
NonHDL: 162.75
Triglycerides: 221 mg/dL — ABNORMAL HIGH (ref 0.0–149.0)
VLDL: 44.2 mg/dL — ABNORMAL HIGH (ref 0.0–40.0)

## 2015-09-05 LAB — LDL CHOLESTEROL, DIRECT: LDL DIRECT: 125 mg/dL

## 2015-09-05 NOTE — Telephone Encounter (Signed)
Pt started topamax on 08/17/15. Called and asked pt to give it 2 more weeks and to call back around the 18th to update me.

## 2015-09-05 NOTE — Telephone Encounter (Signed)
Kim RevelsConnie Decker 2055-08-12. She came in today to have lab work done to check her Cholesterol. She also wanted to see if Dr. Everlena CooperJaffe could increase her Topamax medication. Her number is [phone] or [phone]. Thank you

## 2015-09-08 ENCOUNTER — Telehealth: Payer: Self-pay

## 2015-09-08 NOTE — Telephone Encounter (Signed)
-----   Message from Drema DallasAdam R Jaffe, DO sent at 09/08/2015  6:54 AM EDT ----- The LDL is 125.  I would like to aim for less than 100.  I would like to start simvastatin 10mg  daily and recheck lipid panel prior to follow up.  Can we contact PCP office to obtain latest hepatic panel?  Thank you.

## 2015-09-08 NOTE — Telephone Encounter (Signed)
-----   Message from Drema DallasAdam R Jaffe, DO sent at 09/08/2015  6:57 AM EDT ----- Actually, reviewing guidelines, we do not need to start statin therapy at this time.

## 2015-12-13 ENCOUNTER — Telehealth: Payer: Self-pay | Admitting: Neurology

## 2015-12-13 ENCOUNTER — Other Ambulatory Visit: Payer: Self-pay | Admitting: Internal Medicine

## 2015-12-13 NOTE — Telephone Encounter (Signed)
[facility]iana/Disability Determination [employer] [phone] ext [phone] called to check status of records request that was mailed to our office August 16th. Please call.

## 2015-12-13 NOTE — Telephone Encounter (Signed)
Pt medical records faxed to [employer] attn Tiana.

## 2015-12-14 ENCOUNTER — Ambulatory Visit: Payer: BLUE CROSS/BLUE SHIELD | Admitting: Neurology

## 2015-12-14 DIAGNOSIS — Z029 Encounter for administrative examinations, unspecified: Secondary | ICD-10-CM

## 2015-12-27 DIAGNOSIS — Z0271 Encounter for disability determination: Secondary | ICD-10-CM

## 2016-01-01 ENCOUNTER — Ambulatory Visit (INDEPENDENT_AMBULATORY_CARE_PROVIDER_SITE_OTHER): Payer: BLUE CROSS/BLUE SHIELD

## 2016-01-01 DIAGNOSIS — Z23 Encounter for immunization: Secondary | ICD-10-CM | POA: Diagnosis not present

## 2016-01-11 ENCOUNTER — Telehealth: Payer: Self-pay

## 2016-01-11 ENCOUNTER — Ambulatory Visit (INDEPENDENT_AMBULATORY_CARE_PROVIDER_SITE_OTHER): Payer: BLUE CROSS/BLUE SHIELD | Admitting: Neurology

## 2016-01-11 ENCOUNTER — Encounter: Payer: Self-pay | Admitting: Neurology

## 2016-01-11 ENCOUNTER — Other Ambulatory Visit (INDEPENDENT_AMBULATORY_CARE_PROVIDER_SITE_OTHER): Payer: BLUE CROSS/BLUE SHIELD

## 2016-01-11 VITALS — BP 134/76 | HR 78 | Ht 66.0 in | Wt 178.0 lb

## 2016-01-11 DIAGNOSIS — IMO0002 Reserved for concepts with insufficient information to code with codable children: Secondary | ICD-10-CM

## 2016-01-11 DIAGNOSIS — R413 Other amnesia: Secondary | ICD-10-CM | POA: Diagnosis not present

## 2016-01-11 DIAGNOSIS — I63411 Cerebral infarction due to embolism of right middle cerebral artery: Secondary | ICD-10-CM | POA: Diagnosis not present

## 2016-01-11 DIAGNOSIS — G43709 Chronic migraine without aura, not intractable, without status migrainosus: Secondary | ICD-10-CM

## 2016-01-11 LAB — VITAMIN B12: VITAMIN B 12: 559 pg/mL (ref 211–911)

## 2016-01-11 LAB — TSH: TSH: 3.18 u[IU]/mL (ref 0.35–4.50)

## 2016-01-11 MED ORDER — DIPHENHYDRAMINE HCL 50 MG/ML IJ SOLN
25.0000 mg | Freq: Once | INTRAMUSCULAR | Status: AC
  Administered 2016-01-11: 25 mg via INTRAMUSCULAR

## 2016-01-11 MED ORDER — KETOROLAC TROMETHAMINE 30 MG/ML IJ SOLN
60.0000 mg | Freq: Once | INTRAMUSCULAR | Status: AC
  Administered 2016-01-11: 60 mg via INTRAMUSCULAR

## 2016-01-11 MED ORDER — METOCLOPRAMIDE HCL 5 MG/ML IJ SOLN
10.0000 mg | Freq: Once | INTRAMUSCULAR | Status: AC
  Administered 2016-01-11: 10 mg via INTRAMUSCULAR

## 2016-01-11 NOTE — Progress Notes (Addendum)
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  She is accompanied by her husband who supplements history.  UPDATE: Headaches unchanged.  Topiramate caused unsteadiness and dizziness. Intensity:  10/10 Duration:  3 to 4 hours Frequency:  15 headache days per month Current NSAIDS:  no Current analgesics:  no Current triptans:  contraindicated Current anti-emetic:  no Current muscle relaxants:  no Current anti-anxiolytic:  clonazepam 2mg  at bedtime Current sleep aide:  clonazepam 2mg  Current Antihypertensive medications:  Atenolol 100mg  (ineffective) Current Antidepressant medications:  lexapro 20mg  Current Anticonvulsant medications:  no Current Vitamins/Herbal/Supplements:  MVI Current Antihistamines/Decongestants:  no Other therapy:  no Other medication:  ASA 81mg    Caffeine:  1 cup coffee daily, tea Alcohol:  no Smoker:  no Diet:  okay Exercise:  no Depression/stress:  Some stress and depression since stroke.  Fears another stroke   LDL was 125.  She was started on simvastatin 10mg  daily.  Since her stroke, she has had memory issues.  She forgets things quickly.  On rare occasions, she briefly is unsure where she is while driving but quickly re-orients herself.  She has trouble with names, even people she has known a while, such as her neighbor.  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  She feels it has been unchanged since the stroke.  HISTORY: Onset:  She had an embolic right frontal ischemic stroke in March 2016, which was demonstrated on MRI of brain.  MRA showed occlusion of right M2 branch, felt to be embolic.  She was found to have a PFO with atrial septal aneurysm, which was subsequently closed.  EF  was 55%.  DVT was not found.  She had been taking Plavix but now just ASA 81mg  daily.  She is not on a statin.  Headaches started soon after the stroke.   MRI of brain from 07/17/15, which was personally reviewed, again revealed remote right insular and parietal lobe infarcts but nothing acute.   Location:  Bi-frontal She has a constant headache but fluctuates in intensity.  When she has a severe headache: Quality:  pounding Initial Intensity:  10/10 Aura:  Only once she saw a bright light Prodrome:  no Associated symptoms:  no Initial Duration:  3 to 4 hours Initial Frequency:  3 to 4 days per week Triggers/exacerbating factors:  Bending over Relieving factors:  Rubbing temples Activity:  Lays down   Past NSAIDS:  ibuprofen (overuse) Past analgesics:  Excedrin (side effects), Tylenol (overuse) Past abortive triptans:  contraindicated Past antihypertensive medications:  no Past antidepressant medications:  Bupropion, amitriptyline (side effects) Past anticonvulsant medications:  no Past vitamins/Herbal/Supplements: no   Sleep hygiene:  good   She used to work at a [employer].  She has been on disability since the stroke.  PAST MEDICAL HISTORY: Past Medical History:  Diagnosis Date  . ADHD (attention deficit hyperactivity disorder)   . ALLERGIC RHINITIS   . Asthma   . Claustrophobia    unable to ride in elavator;leave room door open  . COPD (chronic obstructive pulmonary disease) (HCC)   . History of frequent urinary tract infections   . Migraine headache    since childhood  . PONV (postoperative nausea and  vomiting)    Pt states Zofran does not relieve sx; prefers Phenergan  . Post-menopause on HRT (hormone replacement therapy)   . Restless leg   . Stroke [facility])     MEDICATIONS: Current Outpatient Prescriptions on File Prior to Visit  Medication Sig Dispense Refill  . acetaminophen (TYLENOL) 325 MG tablet Take 650 mg by mouth every 6 (six) hours as needed for  headache.    . albuterol (PROVENTIL HFA) 108 (90 Base) MCG/ACT inhaler Inhale 2 puffs into the lungs every 4 (four) hours as needed for wheezing or shortness of breath. ProAir 18 g 12  . aspirin EC 81 MG tablet Take 81 mg by mouth daily.    . clonazePAM (KLONOPIN) 2 MG tablet Take 2 mg by mouth at bedtime. For restless legs  0  . escitalopram (LEXAPRO) 20 MG tablet Take 20 mg by mouth daily.   12  . montelukast (SINGULAIR) 10 MG tablet TAKE 1 TABLET (10 MG TOTAL) BY MOUTH DAILY. 30 tablet 2  . Multiple Vitamin (MULTIVITAMIN WITH MINERALS) TABS tablet Take 1 tablet by mouth daily.     No current facility-administered medications on file prior to visit.     ALLERGIES: Allergies  Allergen Reactions  . Penicillins Swelling  . Cefuroxime Swelling  . Excedrin Tension Headache [Acetaminophen-Caffeine] Swelling  . Hydrocodone Nausea Only  . Codeine Nausea Only    FAMILY HISTORY: Family History  Problem Relation Age of Onset  . Lung cancer Brother   . Asthma Mother   . Allergies Mother   . Heart disease Father     cause of death    SOCIAL HISTORY: Social History   Social History  . Marital status: Married    Spouse name: N/A  . Number of children: 3  . Years of education: N/A   Occupational History  . Hair Stylist [employer]   Social History Main Topics  . Smoking status: Former Smoker    Packs/day: 2.00    Years: 25.00    Types: Cigarettes    Quit date: 04/02/2003  . Smokeless tobacco: Never Used  . Alcohol use No  . Drug use: No  . Sexual activity: Yes   Other Topics Concern  . Not on file   Social History Narrative  . No narrative on file    REVIEW OF SYSTEMS: Constitutional: No fevers, chills, or sweats, no generalized fatigue, change in appetite Eyes: No visual changes, double vision, eye pain Ear, nose and throat: No hearing loss, ear pain, nasal congestion, sore throat Cardiovascular: No chest pain, palpitations Respiratory:  No shortness of breath  at rest or with exertion, wheezes GastrointestinaI: No nausea, vomiting, diarrhea, abdominal pain, fecal incontinence Genitourinary:  No dysuria, urinary retention or frequency Musculoskeletal:  No neck pain, back pain Integumentary: No rash, pruritus, skin lesions Neurological: as above Psychiatric: No depression, insomnia, anxiety Endocrine: No palpitations, fatigue, diaphoresis, mood swings, change in appetite, change in weight, increased thirst Hematologic/Lymphatic:  No purpura, petechiae. Allergic/Immunologic: no itchy/runny eyes, nasal congestion, recent allergic reactions, rashes  PHYSICAL EXAM: Vitals:   01/11/16 0900  BP: 134/76  Pulse: 78   General: Currently with headache.  Patient appears well-groomed.  normal body habitus. Head:  Normocephalic/atraumatic Eyes:  Fundi examined but not visualized Neck: supple, no paraspinal tenderness, full range of motion Heart:  Regular rate and rhythm Lungs:  Clear to auscultation bilaterally Back: No paraspinal tenderness Neurological Exam: alert and oriented to person, place, and time. Attention span and concentration intact, recent and  remote memory intact, fund of knowledge intact.  Speech fluent and not dysarthric, language intact.   MMSE - Mini Mental State Exam 01/11/2016  Orientation to time 5  Orientation to Place 5  Registration 3  Attention/ Calculation 5  Recall 2  Language- name 2 objects 2  Language- repeat 1  Language- follow 3 step command 3  Language- read & follow direction 1  Write a sentence 1  Copy design 1  Total score 29   CN II-XII intact. Bulk and tone normal, muscle strength 5/5 throughout.  Sensation to light touch  intact.  Deep tendon reflexes 2+ throughout.  Finger to nose testing intact.  Gait normal  IMPRESSION: Migraine with medication overuse headache Embolic stroke involving the right MCA territory, status post PFO closure Memory deficits.  Secondary to stroke or  depression. Depression  PLAN: 1.  She meets criteria for Botox.  She has had at least 15 days of headache per month for over 3 months, failed amitriptyline, atenolol, topiramate, Lexapro 2.  We will check B12 and TSH.  If unremarkable, send for neuropsych testing. 3.  Will administer headache cocktail (Toradol/Reglan/Benadryl).  Her husband will drive her home. 4.  Continue ASA.  On statin.  Check LDL (goal should be less than 100) 5.  Follow up.  Shon MilletAdam Jaffe, DO  CC:  Windle GuardWilson Elkins, MD

## 2016-01-11 NOTE — Telephone Encounter (Signed)
[facility] [place]. Call reference Number: [id number]. [place] [facility] [facility] [place] @ [phone]

## 2016-01-11 NOTE — Progress Notes (Signed)
Chart forwarded.  

## 2016-01-11 NOTE — Patient Instructions (Signed)
1.  We will get preauthorization to set you up for Botox 2.  We will check B12 and TSH which may cause memory problems.  If they are normal, we will send you for neuropsychological testing. 3.  We will give you a headache cocktail today. 4.  We will check a fasting lipid panel.  Continue simvastatin for now. 5.  Follow up 6 weeks after first round of Botox.

## 2016-01-12 ENCOUNTER — Telehealth: Payer: Self-pay

## 2016-01-12 NOTE — Telephone Encounter (Signed)
-----   Message from Drema DallasAdam R Jaffe, DO sent at 01/11/2016  4:09 PM EDT ----- Labs are normal.  Please refer for neuropsychological testing

## 2016-01-12 NOTE — Telephone Encounter (Signed)
Message relayed to patient. Verbalized understanding and denied questions.   

## 2016-01-16 ENCOUNTER — Ambulatory Visit: Payer: BLUE CROSS/BLUE SHIELD | Admitting: Neurology

## 2016-01-26 ENCOUNTER — Ambulatory Visit: Payer: BLUE CROSS/BLUE SHIELD | Admitting: Neurology

## 2016-01-30 NOTE — Telephone Encounter (Signed)
Received fax taht pt was approved. Scheduled for next Thursday. Will scan approval fax.

## 2016-02-08 ENCOUNTER — Ambulatory Visit (INDEPENDENT_AMBULATORY_CARE_PROVIDER_SITE_OTHER): Payer: BLUE CROSS/BLUE SHIELD | Admitting: Neurology

## 2016-02-08 ENCOUNTER — Ambulatory Visit (INDEPENDENT_AMBULATORY_CARE_PROVIDER_SITE_OTHER): Payer: BLUE CROSS/BLUE SHIELD | Admitting: Psychology

## 2016-02-08 ENCOUNTER — Encounter: Payer: Self-pay | Admitting: Psychology

## 2016-02-08 DIAGNOSIS — G43709 Chronic migraine without aura, not intractable, without status migrainosus: Secondary | ICD-10-CM | POA: Diagnosis not present

## 2016-02-08 DIAGNOSIS — F418 Other specified anxiety disorders: Secondary | ICD-10-CM

## 2016-02-08 DIAGNOSIS — R413 Other amnesia: Secondary | ICD-10-CM

## 2016-02-08 DIAGNOSIS — I63411 Cerebral infarction due to embolism of right middle cerebral artery: Secondary | ICD-10-CM

## 2016-02-08 MED ORDER — KETOROLAC TROMETHAMINE 60 MG/2ML IM SOLN
60.0000 mg | Freq: Once | INTRAMUSCULAR | Status: AC
  Administered 2016-02-08: 60 mg via INTRAMUSCULAR

## 2016-02-08 MED ORDER — ONABOTULINUMTOXINA 100 UNITS IJ SOLR
155.0000 [IU] | Freq: Once | INTRAMUSCULAR | Status: AC
  Administered 2016-02-08: 155 [IU] via INTRAMUSCULAR

## 2016-02-08 NOTE — Addendum Note (Signed)
Addended by: Sheilah MinsFOX, Jomaira Darr A on: 02/08/2016 09:31 AM   Modules accepted: Orders

## 2016-02-08 NOTE — Progress Notes (Signed)
NEUROPSYCHOLOGICAL INTERVIEW (CPT: [id number])  Name: [name] Date of Birth: September 21, 1955 Date of Interview: 02/08/2016  Reason for Referral:  [name] is a 60 y.o., left-handed female who is referred for neuropsychological evaluation by Dr. 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 This patient is unaccompanied in the office for today's appointment.  History of Presenting Problem:  [name] reported that she was in her usual state of health and functioning when she sustained a stroke in March 2016. 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  MRA showed occlusion of right M2 branch, felt to be embolic.  She was found to have a PFO with atrial septal aneurysm, which was subsequently closed. She has had balance changes, increased headaches, increased depression/anxiety and memory problems since the stroke. At her appointment with Dr. Everlena CooperJaffe on 01/11/2016, she scored 29/30 on the MMSE.  The patient reported the following cognitive concerns since her stroke in March 2016: missing a turn when driving to a familiar location, missing an appointment, having difficulty with facial recognition and remembering names of people she knows, repeating questions/statements, misplacing/losing items, more difficulty remembering to take medication, occasional word finding difficulty and feeling more overwhelmed cognitively by incoming information. She has not gotten lost when driving or had significant difficulty recalling directions when driving. She noted that she has a lifelong history of difficulty concentrating, working on multiple tasks at the same time rather than carrying out one task to completion, jumping to different topics in conversation, and distractibility. She does think her concentration may be worse than it was before the stroke.    Overall, the patient feels her cognitive functioning has been stable, and perhaps recently somewhat improved, since the stroke.   With regard to mood, she reported increased depression and anxiety since the stroke with significant reduction in motivation and interest. She was taking Lexapro prior to the stroke and continues to take it but she has always thought it made her have less energy. She has fear about having another stroke. She notes that she was never formally evaluated for ADHD but her PCP did prescribe her medication for it in the past. She has never had counseling or therapy for her depression/anxiety in the past.  The patient has been on disability and has not worked since her stroke. 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 One of her fears about returning to work is being alone for long stretches of time and having another stroke while she is alone.   She does continue to manage most instrumental ADLs, including driving, medications, finances (sometimes requires assistance from her husband when she is feeling "foggy"), appointments (requires reminders), and cooking.   Physically, she complains of frequent headaches which also make her more depressed. She is hoping to get back to exercising. Before the stroke, she exercised regularly and loved doing this. She recently went to the gym for the first time since her stroke and she did feel increased energy the next day.  Her balance has improved. She does sometimes catch herself "walking sideways". She has not had any falls.  She denied any sleeping difficulty. She takes clonazepam at night for RLS. She denied change or problem with appetite. She has not had any psychosis or behavioral disturbance. She denied suicidal ideation or intention.   No imminent risk  of self-harm was identified.   Social History: [place] Education: 10th grade + GED Occupational history: Cosmetologist  Marital history: She has  been married three times. Her first marriage ended in divorce. She was married to her second husband for 20 years; he passed away from lung cancer. She has been married to her current husband for 8 years. She reported her husband is very supportive. Married x8 years. She has 3 biological children and one adopted child. She has 11 grandchildren. Alcohol/Tobacco/Substances: Occasional glass of wine. No h/o heavy drinking. Former smoker (quit 2005). No illicit substance use.   Medical History: Past Medical History:  Diagnosis Date  . ADHD (attention deficit hyperactivity disorder)   . ALLERGIC RHINITIS   . Asthma   . Claustrophobia    unable to ride in elavator;leave room door open  . COPD (chronic obstructive pulmonary disease) (HCC)   . History of frequent urinary tract infections   . Migraine headache    since childhood  . PONV (postoperative nausea and vomiting)    Pt states Zofran does not relieve sx; prefers Phenergan  . Post-menopause on HRT (hormone replacement therapy)   . Restless leg   . Stroke [facility](HCC)     Current Medications:  Outpatient Encounter Prescriptions as of 02/08/2016  Medication Sig  . acetaminophen (TYLENOL) 325 MG tablet Take 650 mg by mouth every 6 (six) hours as needed for headache.  . albuterol (PROVENTIL HFA) 108 (90 Base) MCG/ACT inhaler Inhale 2 puffs into the lungs every 4 (four) hours as needed for wheezing or shortness of breath. ProAir  . aspirin EC 81 MG tablet Take 81 mg by mouth daily.  [name]. atenolol (TENORMIN) 100 MG tablet Take 100 mg by mouth daily.  . clonazePAM (KLONOPIN) 2 MG tablet Take 2 mg by mouth at bedtime. For restless legs  . escitalopram (LEXAPRO) 20 MG tablet Take 20 mg by mouth daily.   . montelukast (SINGULAIR) 10 MG tablet TAKE 1 TABLET (10 MG TOTAL) BY MOUTH DAILY.  . Multiple Vitamin (MULTIVITAMIN WITH MINERALS) TABS tablet Take 1 tablet by mouth daily.  . [EXPIRED] ketorolac (TORADOL) injection 60 mg    No facility-administered  encounter medications on file as of 02/08/2016.     Behavioral Observations:   Appearance: Casually dressed and appropriately groomed Gait: Ambulated independently, no abnormalities observed Speech: Fluent; normal rate, rhythm and volume Thought process: Linear, goal directed Affect: Mildly blunted, mildly depressed Interpersonal: Pleasant, appropriate   TESTING: There is medical necessity to proceed with neuropsychological assessment as the results will be used to aid in differential diagnosis and clinical decision-making and to inform specific treatment recommendations. 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   PLAN: The patient will return for a full battery of neuropsychological testing with a psychometrician under my supervision. Education regarding testing procedures was provided. Subsequently, the patient will see this provider for a follow-up session at which time her test performances and my impressions and treatment recommendations will be reviewed in detail.   Full neuropsychological evaluation report to follow.

## 2016-02-08 NOTE — Progress Notes (Signed)
[facility]   Procedure Note Botox  Attending: Dr. Jaffe  Preoperative Diagnosis(es): Chronic migraine  Consent obtained from: The patient Benefits discussed included, but were not limited to decreased muscle tightness, increased joint range of motion, and decreased pain.  Risk discussed included, but were not limited pain and discomfort, bleeding, bruising, excessive weakness, venous thrombosis, muscle atrophy and dysphagia.  Anticipated outcomes of the procedure as well as he risks and benefits of the alternatives to the procedure, and the roles and tasks of the personnel to be involved, were discussed with the patient, and the patient consents to the procedure and agrees to proceed. A copy of the patient medication guide was given to the patient which explains the blackbox warning.  Patients identity and treatment sites confirmed Yes.  .  Details of Procedure: Skin was cleaned with alcohol. Prior to injection, the needle plunger was aspirated to make sure the needle was not within a blood vessel.  There was no blood retrieved on aspiration.    Following is a summary of the muscles injected  And the amount of Botulinum toxin used:  Dilution 200 units of Botox was reconstituted with 4 ml of preservative free normal saline. Time of reconstitution: At the time of the office visit (<30 minutes prior to injection)   Injections  155 total units of Botox was injected with a 30 gauge needle.  Injection Sites: L occipitalis: 15 units- 3 sites  R occiptalis: 15 units- 3 sites  L upper trapezius: 15 units- 3 sites R upper trapezius: 15 units- 3 sits          L paraspinal: 10 units- 2 sites R paraspinal: 10 units- 2 sites  Face L frontalis(2 injection sites):10 units   R frontalis(2 injection sites):10 units         L corrugator: 5 units   R corrugator: 5 units           Procerus: 5 units   L temporalis: 20 units R temporalis: 20 units   Agent:  200 units of botulinum Type A  (Onobotulinum Toxin type A) was reconstituted with 4 ml of preservative free normal saline.  Time of reconstitution: At the time of the office visit (<30 minutes prior to injection)     Total injected (Units): 155  Total wasted (Units): 45  Patient tolerated procedure well without complications.   Reinjection is anticipated in 3 months.   

## 2016-02-15 ENCOUNTER — Ambulatory Visit (INDEPENDENT_AMBULATORY_CARE_PROVIDER_SITE_OTHER): Payer: BLUE CROSS/BLUE SHIELD | Admitting: Psychology

## 2016-02-15 DIAGNOSIS — I63411 Cerebral infarction due to embolism of right middle cerebral artery: Secondary | ICD-10-CM

## 2016-02-15 DIAGNOSIS — R413 Other amnesia: Secondary | ICD-10-CM

## 2016-02-15 NOTE — Progress Notes (Signed)
   Neuropsychology Note  Kim SeltzerConnie B Decker returned today for 3 hours of neuropsychological testing with technician, [name], BS, under the supervision of Dr. Elvis CoilMaryBeth Bailar. The patient did not appear overtly distressed by the testing session, per behavioral observation or via self-report to the technician. Rest breaks were offered. [name] will return within 2 weeks for a feedback session with Dr. Alinda DoomsBailar at which time her test performances, clinical impressions and treatment recommendations will be reviewed in detail. The patient understands she can contact our office should she require our assistance before this time.  Full report to follow.

## 2016-03-03 NOTE — Progress Notes (Signed)
NEUROPSYCHOLOGICAL EVALUATION   Name:    [name]  Date of Birth:   1955-08-03 Date of Interview:  02/08/2016 Date of Testing:  02/15/2016   Date of Feedback:  03/04/2016       Background Information:  Reason for Referral:  [name] is a 60 y.o., left-handed female referred by Dr. Metta Clines to assess her current level of cognitive functioning and assist in differential diagnosis. The current evaluation consisted of a review of available medical records, an interview with the patient, and the completion of a neuropsychological testing battery. Informed consent was obtained.  History of Presenting Problem:  [name] reported that she was in her usual state of health and functioning when she sustained a stroke in March 2016. 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 MRA showed occlusion of right M2 branch, felt to be embolic. She was found to have a PFO with atrial septal aneurysm, which was subsequently closed. She has had balance changes, increased headaches, increased depression/anxiety and memory problems since the stroke. At her appointment with Dr. Tomi Likens on 01/11/2016, she scored 29/30 on the MMSE.  The patient reported the following cognitive concerns since her stroke in March 2016: missing a turn when driving to a familiar location, missing an appointment, having difficulty with facial recognition and remembering names of people she knows, repeating questions/statements, misplacing/losing items, more difficulty remembering to take medication, occasional word finding difficulty and feeling more overwhelmed cognitively by incoming information. She has not gotten lost when driving or had significant difficulty recalling directions when driving. She noted that she has a lifelong history of difficulty concentrating, working on multiple tasks at the same time rather than carrying out one task to completion, jumping  to different topics in conversation, and distractibility. She does think her concentration may be worse than it was before the stroke.   Overall, the patient feels her cognitive functioning has been stable, and perhaps recently somewhat improved, since the stroke.   With regard to mood, she reported increased depression and anxiety since the stroke with significant reduction in motivation and interest. She was taking Lexapro prior to the stroke and continues to take it but she has always thought it made her have less energy. She has fear about having another stroke. She notes that she was never formally evaluated for ADHD but her PCP did prescribe her medication for it in the past. She has never had counseling or therapy for her depression/anxiety in the past.  The patient has been on disability and has not worked since her stroke. 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 One of her fears about returning to work is being alone for long stretches of time and having another stroke while she is alone.   She does continue to manage most instrumental ADLs, including driving, medications, finances (sometimes requires assistance from her husband when she is feeling "foggy"), appointments (requires reminders), and cooking.   Physically, she complains of frequent headaches which also make her more depressed. She is hoping to get back to exercising. Before the stroke, she exercised regularly and loved doing this. She recently went to the gym for the first time since her stroke and she did feel increased energy the next day.  Her balance has improved. She does sometimes catch herself "walking sideways". She has not had any falls.  She denied any sleeping difficulty. She takes clonazepam at  night for RLS. She denied change or problem with appetite. She has not had any psychosis or behavioral disturbance. She denied suicidal ideation or intention.   No imminent risk of self-harm was  identified.   Social History: [place]ised[place] Education: 10th grade + GED Occupational history: Cosmetologist  Marital history: She has been married three times. Her first marriage ended in divorce. She was married to her second husband for 20 years; he passed away from lung cancer. She has been married to her current husband for 8 years. She reported her husband is very supportive. Married x8 years. She has 3 biological children and one adopted child. She has 11 grandchildren. Alcohol/Tobacco/Substances: Occasional glass of wine. No h/o heavy drinking. Former smoker (quit 2005). No illicit substance use.   Medical History:  Past Medical History:  Diagnosis Date  . ADHD (attention deficit hyperactivity disorder)   . ALLERGIC RHINITIS   . Asthma   . Claustrophobia    unable to ride in elavator;leave room door open  . COPD (chronic obstructive pulmonary disease) ([place])   . History of frequent urinary tract infections   . Migraine headache    since childhood  . PONV (postoperative nausea and vomiting)    Pt states Zofran does not relieve sx; prefers Phenergan  . Post-menopause on HRT (hormone replacement therapy)   . Restless leg   . Stroke [facility])     Current medications:  Outpatient Encounter Prescriptions as of 03/04/2016  Medication Sig  . acetaminophen (TYLENOL) 325 MG tablet Take 650 mg by mouth every 6 (six) hours as needed for headache.  . albuterol (PROVENTIL HFA) 108 (90 Base) MCG/ACT inhaler Inhale 2 puffs into the lungs every 4 (four) hours as needed for wheezing or shortness of breath. ProAir  . aspirin EC 81 MG tablet Take 81 mg by mouth daily.  [name] atenolol (TENORMIN) 100 MG tablet Take 100 mg by mouth daily.  . clonazePAM (KLONOPIN) 2 MG tablet Take 2 mg by mouth at bedtime. For restless legs  . escitalopram (LEXAPRO) 20 MG tablet Take 20 mg by mouth daily.   . montelukast (SINGULAIR) 10 MG tablet TAKE 1 TABLET (10 MG TOTAL) BY MOUTH DAILY.  . Multiple Vitamin  (MULTIVITAMIN WITH MINERALS) TABS tablet Take 1 tablet by mouth daily.   No facility-administered encounter medications on file as of 03/04/2016.     Current Examination:  Behavioral Observations:  Appearance: Casually dressed and appropriately groomed Gait: Ambulated independently, no abnormalities observed Speech: Fluent; normal rate, rhythm and volume Thought process: Linear, goal directed Affect: Mildly blunted, mildly depressed Interpersonal: Pleasant, appropriate Orientation: Oriented to all spheres. Accurately named the current President and his predecessor.  Tests Administered: . Test of Premorbid Functioning (TOPF) . Wechsler Adult Intelligence Scale-Fourth Edition (WAIS-IV): Similarities, Block Design, Matrix Reasoning, Arithmetic, Symbol Search, Coding and Digit Span subtests . Wechsler Memory Scale-Fourth Edition (WMS-IV) Adult Version (ages 56-69): Logical Memory I, II and Recognition subtests  . [place] Verbal Learning Test - 2nd Edition (CVLT-2) Standard Form . Rey Complex Figure Test (RCFT) . Conners Continuous Performance Test 3rd Edition (CPT3) . [employer] (WCST) . Repeatable Battery for the Assessment of Neuropsychological Status (RBANS) Form A:  Line Orientation subtest . Controlled Oral Word Association Test (COWAT) . Trail Making Test A and B . Neuropsychological Assessment Battery (NAB) Language Module, Form 1:  Naming subtest . Clock Drawing Test . Olevia Bowens Depression Inventory - Second edition (BDI-II) . Beck Anxiety Inventory (BAI) . Generalized Anxiety Disorder - 7  item screener (GAD-7) . [place] Rating Scale [facility])  Test Results: Note: Standardized scores are presented only for use by appropriately trained professionals and to allow for any future test-retest comparison. These scores should not be interpreted without consideration of all the information that is contained in the rest of the report. The most recent standardization  samples from the test publisher or other sources were used whenever possible to derive standard scores; scores were corrected for age, gender, ethnicity and education when available.   Test Scores:  Test Name Raw Score Standardized Score Descriptor  TOPF 21/70 SS= 80 Low average  WAIS-IV Subtests     Similarities 19/36 ss= 7 Low average  Block Design 28/66 ss= 8 Average  Matrix Reasoning 12/26 ss= 8 Average  Arithmetic 11/22 ss= 7 Low average  Symbol Search 19/60 ss= 6 Low average  Coding 40/135 ss= 6 Low average  Digit Span 22/48 ss=8 Average  WAIS-IV Index Scores     Working Memory  SS= 86 Low average  Processing Speed  SS= 79 Borderline  WMS-IV Subtests     LM I 23/50 ss= 9 Average  LM II 23/50 ss= 11 Average  LM II Recognition 24/30 Cum %: 26-50 WNL  CVLT-II Scores     Trial 1 5/16 Z= -1 Low average  Trial 5 11/16 Z= -0.5 Average  Trials 1-5 total 50/80 T= 51 Average  SD Free Recall 11/16 Z= 0 Average  SD Cued Recall 12/16 Z= 0 Average  LD Free Recall 11/16 Z= 0 Average  LD Cued Recall 12/16 Z= 0 Average  Recognition Discriminability 16/16 hits, 3 false positives Z= 0.5 Average  Forced Choice Recognition 16/16  WNL  RCFT     Copy 29.5/36 2-5%ile Below expectation  3' Recall 16.5/36 T= 47 Average  30' Recall 15/36 T= 44 Average  Recognition 17/24 T= 31 Borderline  CPT3     Detectability  T= 70 Very elevated  Omissions  T= 76 Very elevated  Commissions  T= 65 Elevated  Perseverations  T= 73 Very elevated  WCST     Total Errors 41 T= 29 Impaired  Perseverative Responses 28 T= 35 Borderline  Perseverative Errors 24 T= 34 Borderline  Conceptual Level Responses 13 T= 31 Borderline  Categories Completed 1 6-10%   Trials to Complete 1st Category 29 11-16%   Failure to Maintain Set 0    RBANS Subtest     Line Orientation 11/20 Z= -1.9 Borderline  COWAT-FAS 15 T= 26 Impaired  COWAT-Animals 16 T= 41 Low average  Trail Making Test A  25" 0 errors T= 60 High average    Trail Making Test B  123" 3 errors T= 31 Borderline  NAB Language subtest     Naming 29/31 T= 45 Average  Clock drawing   WNL  BDI-II  3/63 WNL  BAI  8/63 WNL  GAD-7  0/21 WNL  WURS  8/100 Below cutoff     Description of Test Results:  Premorbid verbal intellectual abilities were estimated to have been within the low average range based on a test of word reading. Psychomotor processing speed was borderline impaired. Auditory attention and working memory were low average. On a test of sustained visual attention, her profile of scores and her response pattern indicated inattentiveness as well as some impulsivity and difficulty sustaining attention over time. Visual-spatial construction was somewhat variable. Construction of three dimensional blocks to match a stimulus was average, while her drawn copy of a complex geometric figure was  below expectation. Regarding the latter, minor inaccuracies as well as reduced attention to detail were noted qualitatively. She performed in the borderline-impaired range on a visual-perceptual task of line orientation. Language abilities were intact. Specifically, confrontation naming was average, and semantic verbal fluency was low average. With regard to verbal memory, encoding and acquisition of non-contextual information (i.e., word list) was average across five learning trials. After an interference task, free recall was average. After a 20 minute delay, free recall was average. Cued recall was average. Performance on a yes/no recognition task was average. On another verbal memory test, encoding and acquisition of contextual auditory information (i.e., short stories) was average. After a delay, free recall was average. Performance on a yes/no recognition task was within normal limits. With regard to non-verbal memory, delayed free recall of visual information was average. Executive functioning was somewhat variable. Mental flexibility and set-shifting were  borderline impaired on Trails B; she committed three set-loss errors on this task. Verbal fluency with phonemic search restrictions was impaired. Verbal abstract reasoning was low average. Non-verbal abstract reasoning was average. Deductive reasoning and problem-solving skills were borderline impaired to impaired. Performance on a clock drawing task demonstrated some poor planning but intact time placement. On a self-report questionnaire of mood, the patient's responses were not indicative of clinically significant depression at the present time. On self-report measures of anxiety symptoms, she did not indicate significant symptoms of anxiety, including those of panic disorder or generalized anxiety disorder. On a retrospective self-report measure of childhood functioning and behaviors, her responses did not suggest she would have met diagnostic criteria for ADHD as a child.   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  Results of neurocognitive testing reveal impairments in sustained attention, processing speed, visual-spatial skills/perception, and aspects of executive functioning including mental flexibility/set-shifting, deductive reasoning, and phonemic verbal fluency. Per her self-report, she may have had some longstanding attention difficulties prior to her stroke (although I do not have a strong suspicion for childhood ADHD), and if these were present they are likely now worsened. The decreased processing speed she demonstrated on testing is a common result of stroke generally. This may improve over time. The visual-spatial/perceptual deficits and mild executive dysfunction correspond to the location of infarct (right frontal lesion). She is not reporting significant depression or anxiety but she does endorse normal, post-stroke adjustment challenges including some anxiety about stroke recurrence.     Recommendations/Plan: Based on the findings of the  present evaluation, the following recommendations are offered:  1. Optometry or neurophthalmology evaluation could be considered if she has not had one since the stroke, due to visual-perceptual difficulties noted on testing. 2. She will be reassured that many areas of cognitive functioning are intact, including learning and memory as well as language.  3. Strategies to compensate for attention and executive functioning deficits will be reviewed with her, and written information provided.  4. She should be encouraged for slowly returning to exercise and other activities. She should continue social activities and mental stimulation.  5. She has taken medication for ADHD in the past, and she has very clear attention deficits on current testing. A trial of stimulant medication could be considered if this is not medically contraindicated. 6. Neurocognitive re-evaluation can be completed if any cognitive decline is reported or observed in the future.   Feedback to Patient: [name] returned for a feedback appointment on 03/04/2016 to review the results of her neuropsychological evaluation with this provider. 20 minutes face-to-face  time was spent reviewing her test results, my impressions and my recommendations as detailed above.    Total time spent on this patient's case: [id number] unit for interview with psychologist; [phone] units of testing by psychometrician under psychologist's supervision; [phone] units for medical record review, scoring of neuropsychological tests, interpretation of test results, preparation of this report, and review of results to the patient by psychologist.      Thank you for your referral of [name]. Please feel free to contact me if you have any questions or concerns regarding this report.

## 2016-03-04 ENCOUNTER — Encounter: Payer: Self-pay | Admitting: Psychology

## 2016-03-04 ENCOUNTER — Ambulatory Visit (INDEPENDENT_AMBULATORY_CARE_PROVIDER_SITE_OTHER): Payer: BLUE CROSS/BLUE SHIELD | Admitting: Psychology

## 2016-03-04 DIAGNOSIS — I63411 Cerebral infarction due to embolism of right middle cerebral artery: Secondary | ICD-10-CM | POA: Diagnosis not present

## 2016-03-04 DIAGNOSIS — IMO0002 Reserved for concepts with insufficient information to code with codable children: Secondary | ICD-10-CM

## 2016-03-04 DIAGNOSIS — R4189 Other symptoms and signs involving cognitive functions and awareness: Secondary | ICD-10-CM

## 2016-03-04 DIAGNOSIS — I639 Cerebral infarction, unspecified: Secondary | ICD-10-CM | POA: Diagnosis not present

## 2016-03-11 ENCOUNTER — Ambulatory Visit: Payer: BLUE CROSS/BLUE SHIELD | Admitting: Neurology

## 2016-03-13 ENCOUNTER — Other Ambulatory Visit: Payer: Self-pay | Admitting: Internal Medicine

## 2016-03-14 ENCOUNTER — Ambulatory Visit (INDEPENDENT_AMBULATORY_CARE_PROVIDER_SITE_OTHER): Payer: BLUE CROSS/BLUE SHIELD | Admitting: Neurology

## 2016-03-14 ENCOUNTER — Encounter: Payer: Self-pay | Admitting: Neurology

## 2016-03-14 ENCOUNTER — Other Ambulatory Visit: Payer: Self-pay | Admitting: Family Medicine

## 2016-03-14 VITALS — BP 132/78 | HR 73 | Ht 66.0 in | Wt 187.0 lb

## 2016-03-14 DIAGNOSIS — R41842 Visuospatial deficit: Secondary | ICD-10-CM

## 2016-03-14 DIAGNOSIS — H538 Other visual disturbances: Secondary | ICD-10-CM | POA: Diagnosis not present

## 2016-03-14 DIAGNOSIS — I639 Cerebral infarction, unspecified: Secondary | ICD-10-CM | POA: Diagnosis not present

## 2016-03-14 DIAGNOSIS — R4189 Other symptoms and signs involving cognitive functions and awareness: Secondary | ICD-10-CM

## 2016-03-14 DIAGNOSIS — Z1231 Encounter for screening mammogram for malignant neoplasm of breast: Secondary | ICD-10-CM

## 2016-03-14 DIAGNOSIS — G43709 Chronic migraine without aura, not intractable, without status migrainosus: Secondary | ICD-10-CM | POA: Diagnosis not present

## 2016-03-14 DIAGNOSIS — F909 Attention-deficit hyperactivity disorder, unspecified type: Secondary | ICD-10-CM

## 2016-03-14 DIAGNOSIS — IMO0002 Reserved for concepts with insufficient information to code with codable children: Secondary | ICD-10-CM

## 2016-03-14 NOTE — Patient Instructions (Addendum)
1.  We will set you up for Botox 2.  We will refer you to the eye doctor 3.  Ask Dr. Jeannetta NapElkins about treatment for ADHD 4.  Mediterranean diet  Mediterranean Diet A Mediterranean diet refers to food and lifestyle choices that are based on the traditions of countries located on the [place]. This way of eating has been shown to help prevent certain conditions and improve outcomes for people who have chronic diseases, like kidney disease and heart disease. What are tips for following this plan? Lifestyle  Cook and eat meals together with your family, when possible.  Drink enough fluid to keep your urine clear or pale yellow.  Be physically active every day. This includes:  Aerobic exercise like running or swimming.  Leisure activities like gardening, walking, or housework.  Get 7-8 hours of sleep each night.  If recommended by your health care provider, drink red wine in moderation. This means 1 glass a day for nonpregnant women and 2 glasses a day for men. A glass of wine equals 5 oz (150 mL). Reading food labels  Check the serving size of packaged foods. For foods such as rice and pasta, the serving size refers to the amount of cooked product, not dry.  Check the total fat in packaged foods. Avoid foods that have saturated fat or trans fats.  Check the ingredients list for added sugars, such as corn syrup. Shopping  At the grocery store, buy most of your food from the areas near the walls of the store. This includes:  Fresh fruits and vegetables (produce).  Grains, beans, nuts, and seeds. Some of these may be available in unpackaged forms or large amounts (in bulk).  Fresh seafood.  Poultry and eggs.  Low-fat dairy products.  Buy whole ingredients instead of prepackaged foods.  Buy fresh fruits and vegetables in-season from local farmers markets.  Buy frozen fruits and vegetables in resealable bags.  If you do not have access to quality fresh seafood, buy  precooked frozen shrimp or canned fish, such as tuna, salmon, or sardines.  Buy small amounts of raw or cooked vegetables, salads, or olives from the deli or salad bar at your store.  Stock your pantry so you always have certain foods on hand, such as olive oil, canned tuna, canned tomatoes, rice, pasta, and beans. Cooking  Cook foods with extra-virgin olive oil instead of using butter or other vegetable oils.  Have meat as a side dish, and have vegetables or grains as your main dish. This means having meat in small portions or adding small amounts of meat to foods like pasta or stew.  Use beans or vegetables instead of meat in common dishes like chili or lasagna.  Experiment with different cooking methods. Try roasting or broiling vegetables instead of steaming or sauteing them.  Add frozen vegetables to soups, stews, pasta, or rice.  Add nuts or seeds for added healthy fat at each meal. You can add these to yogurt, salads, or vegetable dishes.  Marinate fish or vegetables using olive oil, lemon juice, garlic, and fresh herbs. Meal planning  Plan to eat 1 vegetarian meal one day each week. Try to work up to 2 vegetarian meals, if possible.  Eat seafood 2 or more times a week.  Have healthy snacks readily available, such as:  Vegetable sticks with hummus.  Greek yogurt.  Fruit and nut trail mix.  Eat balanced meals throughout the week. This includes:  Fruit: 2-3 servings a day  Vegetables:  4-5 servings a day  Low-fat dairy: 2 servings a day  Fish, poultry, or lean meat: 1 serving a day  Beans and legumes: 2 or more servings a week  Nuts and seeds: 1-2 servings a day  Whole grains: 6-8 servings a day  Extra-virgin olive oil: 3-4 servings a day  Limit red meat and sweets to only a few servings a month What are my food choices?  Mediterranean diet  Recommended  Grains: Whole-grain pasta. Brown rice. Bulgar wheat. Polenta. Couscous. Whole-wheat bread. Orpah Cobbatmeal.  Quinoa.  Vegetables: Artichokes. Beets. Broccoli. Cabbage. Carrots. Eggplant. Green beans. Chard. Kale. Spinach. Onions. Leeks. Peas. Squash. Tomatoes. Peppers. Radishes.  Fruits: Apples. Apricots. Avocado. Berries. Bananas. Cherries. Dates. Figs. Grapes. Lemons. Melon. Oranges. Peaches. Plums. Pomegranate.  Meats and other protein foods: Beans. Almonds. Sunflower seeds. Pine nuts. Peanuts. Cod. Salmon. Scallops. Shrimp. Tuna. Tilapia. Clams. Oysters. Eggs.  Dairy: Low-fat milk. Cheese. Greek yogurt.  Beverages: Water. Red wine. Herbal tea.  Fats and oils: Extra virgin olive oil. Avocado oil. Grape seed oil.  Sweets and desserts: [place] yogurt with honey. Baked apples. Poached pears. Trail mix.  Seasoning and other foods: Basil. Cilantro. Coriander. Cumin. Mint. Parsley. Sage. Rosemary. Tarragon. Garlic. Oregano. Thyme. Pepper. Balsalmic vinegar. Tahini. Hummus. Tomato sauce. Olives. Mushrooms.  Limit these  Grains: Prepackaged pasta or rice dishes. Prepackaged cereal with added sugar.  Vegetables: Deep fried potatoes (french fries).  Fruits: Fruit canned in syrup.  Meats and other protein foods: Beef. Pork. Lamb. Poultry with skin. Hot dogs. Tomasa BlaseBacon.  Dairy: Ice cream. Sour cream. Whole milk.  Beverages: Juice. Sugar-sweetened soft drinks. Beer. Liquor and spirits.  Fats and oils: Butter. Canola oil. Vegetable oil. Beef fat (tallow). Lard.  Sweets and desserts: Cookies. Cakes. Pies. Candy.  Seasoning and other foods: Mayonnaise. Premade sauces and marinades.  The items listed may not be a complete list. Talk with your dietitian about what dietary choices are right for you. Summary  The Mediterranean diet includes both food and lifestyle choices.  Eat a variety of fresh fruits and vegetables, beans, nuts, seeds, and whole grains.  Limit the amount of red meat and sweets that you eat.  Talk with your health care provider about whether it is safe for you to drink red wine in  moderation. This means 1 glass a day for nonpregnant women and 2 glasses a day for men. A glass of wine equals 5 oz (150 mL). This information is not intended to replace advice given to you by your health care provider. Make sure you discuss any questions you have with your health care provider. Document Released: 11/09/2015 Document Revised: 12/12/2015 Document Reviewed: 11/09/2015 Elsevier Interactive Patient Education  2017 [employer]

## 2016-03-14 NOTE — Addendum Note (Signed)
Addended by: Sheilah MinsFOX, JADA A on: 03/14/2016 10:04 AM   Modules accepted: Orders

## 2016-03-14 NOTE — Progress Notes (Signed)
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  UPDATE: Intensity:  7-8/10 Duration:  Brief with ibuprofen or Tylenol Frequency:  3 days per week Current NSAIDS:  ibuprofen Current analgesics:  Tylenol  Current triptans:  contraindicated Current anti-emetic:  no Current muscle relaxants:  no Current anti-anxiolytic:  clonazepam 2mg  at bedtime Current sleep aide:  clonazepam 2mg  Current Antihypertensive medications:  no Current Antidepressant medications:  lexapro 20mg  Current Anticonvulsant medications:  no Current Vitamins/Herbal/Supplements:  MVI Current Antihistamines/Decongestants:  no Other therapy:  Botox Other medication:  ASA 81mg    Caffeine:  1 cup coffee daily, tea Alcohol:  no Smoker:  no Diet:  okay Exercise:  no Depression/stress:  Some stress and depression since stroke.    Due to memory problems, she underwent neuropsychological testing on 02/15/16.  She demonstrated impairment in sustained attention, processing speed, visual-spatial skills, and executive functioning, which is consistent with mild vascular cognitive impairment.  She has history of ADHD and exhibits attention deficits on exam.  She is currently not treated for ADHD.  HISTORY:  Onset:  She had an embolic right frontal ischemic stroke in March 2016, which was demonstrated on MRI of brain.  MRA showed occlusion of right M2 branch, felt to be embolic.  She was found to have a PFO with atrial septal aneurysm, which was subsequently closed.  EF was 55%.  DVT was not found.  She had been taking Plavix but now just ASA 81mg  daily.  She is not on a statin.  Headaches started soon after the stroke.   MRI of brain from 07/17/15, which was personally reviewed, again revealed remote right insular and  parietal lobe infarcts but nothing acute.   Location:  Bi-frontal She has a constant headache but fluctuates in intensity.  When she has a severe headache: Quality:  pounding Initial Intensity:  10/10 Aura:  Only once she saw a bright light Prodrome:  no Associated symptoms:  no Initial Duration:  3 to 4 hours Initial Frequency:  3 to 4 days per week Triggers/exacerbating factors:  Bending over Relieving factors:  Rubbing temples Activity:  Lays down   Past NSAIDS:  no Past analgesics:  Excedrin (side effects) Past abortive triptans:  contraindicated Past antihypertensive medications:  no Past antidepressant medications:  Bupropion, amitriptyline (side effects) Past anticonvulsant medications:  topiramate Past vitamins/Herbal/Supplements: no   Fears another stroke Sleep hygiene:  good   She used to work at a [employer].  She has been on disability since the stroke.  Since her stroke, she has had memory issues.  She forgets things quickly.  On rare occasions, she briefly is unsure where she is while driving but quickly re-orients herself.  She has trouble with names, even people she has known a while, such as her neighbor.  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  She feels it has been unchanged since the stroke.  PAST MEDICAL HISTORY: Past Medical History:  Diagnosis Date  . ADHD (attention deficit hyperactivity disorder)   . ALLERGIC RHINITIS   . Asthma   . Claustrophobia    unable to ride in elavator;leave room door open  . COPD (chronic obstructive pulmonary disease) (HCC)   . History of frequent urinary tract  infections   . Migraine headache    since childhood  . PONV (postoperative nausea and vomiting)    Pt states Zofran does not relieve sx; prefers Phenergan  . Post-menopause on HRT (hormone replacement therapy)   . Restless leg   . Stroke [facility](HCC)     MEDICATIONS: Current Outpatient Prescriptions on File Prior to Visit    Medication Sig Dispense Refill  . acetaminophen (TYLENOL) 325 MG tablet Take 650 mg by mouth every 6 (six) hours as needed for headache.    . albuterol (PROVENTIL HFA) 108 (90 Base) MCG/ACT inhaler Inhale 2 puffs into the lungs every 4 (four) hours as needed for wheezing or shortness of breath. ProAir 18 g 12  . aspirin EC 81 MG tablet Take 81 mg by mouth daily.    [name]. atenolol (TENORMIN) 100 MG tablet Take 100 mg by mouth daily.    . clonazePAM (KLONOPIN) 2 MG tablet Take 2 mg by mouth at bedtime. For restless legs  0  . escitalopram (LEXAPRO) 20 MG tablet Take 20 mg by mouth daily.   12  . montelukast (SINGULAIR) 10 MG tablet TAKE 1 TABLET (10 MG TOTAL) BY MOUTH DAILY. 30 tablet 2  . Multiple Vitamin (MULTIVITAMIN WITH MINERALS) TABS tablet Take 1 tablet by mouth daily.     No current facility-administered medications on file prior to visit.     ALLERGIES: Allergies  Allergen Reactions  . Penicillins Swelling  . Cefuroxime Swelling  . Excedrin Tension Headache [Acetaminophen-Caffeine] Swelling  . Hydrocodone Nausea Only  . Codeine Nausea Only    FAMILY HISTORY: Family History  Problem Relation Age of Onset  . Lung cancer Brother   . Asthma Mother   . Allergies Mother   . Heart disease Father     cause of death    SOCIAL HISTORY: Social History   Social History  . Marital status: Married    Spouse name: N/A  . Number of children: 3  . Years of education: N/A   Occupational History  . Hair Stylist [employer]   Social History Main Topics  . Smoking status: Former Smoker    Packs/day: 2.00    Years: 25.00    Types: Cigarettes    Quit date: 04/02/2003  . Smokeless tobacco: Never Used  . Alcohol use No  . Drug use: No  . Sexual activity: Yes   Other Topics Concern  . Not on file   Social History Narrative  . No narrative on file    REVIEW OF SYSTEMS: Constitutional: No fevers, chills, or sweats, no generalized fatigue, change in appetite Eyes: No visual  changes, double vision, eye pain Ear, nose and throat: No hearing loss, ear pain, nasal congestion, sore throat Cardiovascular: No chest pain, palpitations Respiratory:  No shortness of breath at rest or with exertion, wheezes GastrointestinaI: No nausea, vomiting, diarrhea, abdominal pain, fecal incontinence Genitourinary:  No dysuria, urinary retention or frequency Musculoskeletal:  No neck pain, back pain Integumentary: No rash, pruritus, skin lesions Neurological: as above Psychiatric: No depression, insomnia, anxiety Endocrine: No palpitations, fatigue, diaphoresis, mood swings, change in appetite, change in weight, increased thirst Hematologic/Lymphatic:  No purpura, petechiae. Allergic/Immunologic: no itchy/runny eyes, nasal congestion, recent allergic reactions, rashes  PHYSICAL EXAM: Vitals:   03/14/16 0840  BP: 132/78  Pulse: 73   General: No acute distress.  Patient appears well-groomed.   Head:  Normocephalic/atraumatic  IMPRESSION: Chronic migraine Vascular cognitive impairment Visual spatial impairment Embolic stroke involving the right MCA territory, status  post PFO closure ADHD  PLAN: Continue current secondary stroke prevention management Mediterranean diet Due to difficulty with visual perception, recommend referral to ophthalmology Social activities and mental stimulation discussed Recommend that PCP consider treatment for ADHD Follow up for next round of Botox in February  26 minutes spent face to face with patient, 100% spent discussing neurocognitive findings, update of migraine control, secondary stroke prevention and management of cognitive deficits.  Shon MilletAdam Jaffe, DO  CC: Windle GuardWilson Elkins, MD

## 2016-04-17 ENCOUNTER — Ambulatory Visit: Payer: BLUE CROSS/BLUE SHIELD

## 2016-04-22 ENCOUNTER — Telehealth: Payer: Self-pay | Admitting: Internal Medicine

## 2016-04-22 ENCOUNTER — Ambulatory Visit (INDEPENDENT_AMBULATORY_CARE_PROVIDER_SITE_OTHER): Payer: BLUE CROSS/BLUE SHIELD | Admitting: Internal Medicine

## 2016-04-22 ENCOUNTER — Encounter: Payer: Self-pay | Admitting: Internal Medicine

## 2016-04-22 VITALS — BP 132/78 | HR 83 | Ht 66.0 in | Wt 186.0 lb

## 2016-04-22 DIAGNOSIS — J209 Acute bronchitis, unspecified: Secondary | ICD-10-CM | POA: Diagnosis not present

## 2016-04-22 DIAGNOSIS — J449 Chronic obstructive pulmonary disease, unspecified: Secondary | ICD-10-CM

## 2016-04-22 MED ORDER — GLYCOPYRROLATE-FORMOTEROL 9-4.8 MCG/ACT IN AERO: 2.0000 | INHALATION_SPRAY | Freq: Two times a day (BID) | RESPIRATORY_TRACT | 11 refills | Status: DC

## 2016-04-22 MED ORDER — LEVALBUTEROL HCL 0.63 MG/3ML IN NEBU
0.6300 mg | INHALATION_SOLUTION | Freq: Once | RESPIRATORY_TRACT | Status: AC
  Administered 2016-04-22: 0.63 mg via RESPIRATORY_TRACT

## 2016-04-22 NOTE — Telephone Encounter (Signed)
Spoke with pt. Pt is c/o increase in SOB, dry cough, chest tightness x 2 weeks. Pt is taking Bevespi sample but has run out, CY states to refill pt's Bevespi - this has been done. Pt is requesting a breathing treatment. Spoke with CY and was advised to add pt on for a nurse visit. Pt has been added to CY's schedule. Nothing further needed at this time.

## 2016-04-22 NOTE — Telephone Encounter (Signed)
Patient states was told to come in, Patient is waiting in the lobby.Charm Rings.[name]

## 2016-04-22 NOTE — Progress Notes (Signed)
HPI female former smoker followed for allergic rhinitis, asthma/COPD, complicated by embolic stroke RMCA/ ASD closed 2016 PFT 11/02/13- mild obstructive airways disease with insignificant response to bronchodilator  --------------------------------------------------------------------------  05/22/2015-61 year old female former smoker followed for allergic rhinitis, asthma/COPD complicated by embolic stroke RMCA/ ASD closed 2016 Follows for: COPD. Pt c/o dry cough, sneezing, pnd and itchy nose. Pt denies wheeze/SOB/CP/tightness.  Seen by Dr. Sherene SiresWert 04/28/2015 with complaint of dry cough, chest tightness, wheezing shortness of breath 1 week, treated as reflux cough Does not recognize any reflux issues. Reports nasal congestion and drainage associated with pollen over the last 2 weeks. Residual dry cough. Dr. Jeannetta NapElkins gave Tussionex. She has had less cough and wheeze for several years since ASD repair.  04/22/2016-61 year old female former smoker followed for allergic rhinitis, asthma/COPD, complicated by embolic stroke RMCA/ASD closed 2016 FOLLOWS FOR: Pt c/o cough and head congestion x 2 weeks. Pt notes some SOB, chest tightness and wheezing. Pt using Tessalon for cough. Denies fever. Requests a neb treatment today.  Has had bad cold recently and walked in today requesting nebulizer treatment for chest tightness. Denies f/s/purulent or chest pain                  ROS-see HPI Constitutional:   No-   weight loss, night sweats, fevers, chills, fatigue, lassitude. HEENT:   No-  headaches, difficulty swallowing, tooth/dental problems, sore throat,       No-  sneezing, itching, ear ache,  +nasal congestion, post nasal drip,  CV:  No-   chest pain, orthopnea, PND, swelling in lower extremities, anasarca, dizziness, palpitations Resp: +  shortness of breath with exertion or at rest.                 productive cough,  No non-productive cough,  No- coughing up of blood.              No change in color of  mucus.  No wheezing.   Skin: No-   rash or lesions. GI:  No-   heartburn, indigestion, abdominal pain, nausea, vomiting, GU:  MS:  No-   joint pain or swelling.   Neuro-     nothing unusual Psych:  No- change in mood or affect. No depression or anxiety.  No memory loss.  OBJ- Physical Exam General- Alert, Oriented, Affect-appropriate, Distress- none acute Skin- rash-none, lesions- none, excoriation- none Lymphadenopathy- none Head- atraumatic            Eyes- Gross vision intact, PERRLA, conjunctivae and secretions clear            Ears- Hearing, canals-normal            Nose- + turbinate edema, no-Septal dev, mucus, polyps, erosion, perforation             Throat- Mallampati II , mucosa cobblestoned , drainage- none, tonsils- atrophic Neck- flexible , trachea midline, no stridor , thyroid nl, carotid no bruit Chest - symmetrical excursion , unlabored           Heart/CV- RRR , no murmur , no gallop  , no rub, nl s1 s2                           - JVD- none , edema- none, stasis changes- none, varices- none           Lung- + diminished but clear, wheeze- none, cough- none , dullness-none, rub- none  Chest wall-  Abd-  Br/ Gen/ Rectal- Not done, not indicated Extrem- cyanosis- none, clubbing, none, atrophy- none, strength- nl Neuro- grossly intact to observation

## 2016-04-22 NOTE — Patient Instructions (Signed)
Neb xop 0.63    Dx acute bronchitis  Nurse is refilling Bevespi inhaler  Please call as needed and keep appt for February. We can discuss home nebulizer machine then.

## 2016-04-23 ENCOUNTER — Ambulatory Visit: Payer: BLUE CROSS/BLUE SHIELD | Admitting: Neurology

## 2016-04-23 NOTE — Assessment & Plan Note (Signed)
Increased dyspnea associated with recent viral cold syndrome suggests exacerbation of COPD although she is not wheezing and denies purulent sputum. Plan-refill her Bevespi inhaler, no treatment today with Xopenex, keep scheduled appointment in February

## 2016-05-03 ENCOUNTER — Ambulatory Visit: Payer: BLUE CROSS/BLUE SHIELD | Admitting: Neurology

## 2016-05-08 ENCOUNTER — Ambulatory Visit
Admission: RE | Admit: 2016-05-08 | Discharge: 2016-05-08 | Disposition: A | Discharge status: Home / Self Care | Payer: BLUE CROSS/BLUE SHIELD | Source: Ambulatory Visit | Attending: Family Medicine | Admitting: Family Medicine

## 2016-05-08 DIAGNOSIS — Z1231 Encounter for screening mammogram for malignant neoplasm of breast: Secondary | ICD-10-CM

## 2016-05-09 ENCOUNTER — Ambulatory Visit
Admission: RE | Admit: 2016-05-09 | Discharge: 2016-05-09 | Disposition: A | Discharge status: Home / Self Care | Payer: BLUE CROSS/BLUE SHIELD | Source: Ambulatory Visit | Attending: Family Medicine | Admitting: Family Medicine

## 2016-05-09 ENCOUNTER — Other Ambulatory Visit: Payer: Self-pay | Admitting: Family Medicine

## 2016-05-09 DIAGNOSIS — R05 Cough: Secondary | ICD-10-CM

## 2016-05-09 DIAGNOSIS — R0602 Shortness of breath: Secondary | ICD-10-CM

## 2016-05-09 DIAGNOSIS — R059 Cough, unspecified: Secondary | ICD-10-CM

## 2016-05-15 ENCOUNTER — Telehealth: Payer: Self-pay | Admitting: Neurology

## 2016-05-15 NOTE — Telephone Encounter (Signed)
Patient had a botox appointment but could not make it due to the flu but she would like to make appt now to come in to get her botox doen please advise [phone]

## 2016-05-21 ENCOUNTER — Encounter: Payer: Self-pay | Admitting: Internal Medicine

## 2016-05-21 ENCOUNTER — Ambulatory Visit (INDEPENDENT_AMBULATORY_CARE_PROVIDER_SITE_OTHER): Payer: BLUE CROSS/BLUE SHIELD | Admitting: Internal Medicine

## 2016-05-21 VITALS — BP 122/76 | HR 86 | Ht 66.0 in | Wt 182.6 lb

## 2016-05-21 DIAGNOSIS — Q211 Atrial septal defect, unspecified: Secondary | ICD-10-CM

## 2016-05-21 DIAGNOSIS — J449 Chronic obstructive pulmonary disease, unspecified: Secondary | ICD-10-CM | POA: Diagnosis not present

## 2016-05-21 MED ORDER — BENZONATATE 200 MG PO CAPS: 200.0000 mg | ORAL_CAPSULE | Freq: Three times a day (TID) | ORAL | 1 refills | Status: DC | PRN

## 2016-05-21 NOTE — Patient Instructions (Signed)
Script for [employer] Standard Companiestessalon perles sent to use for cough when needed  Ok to continue present inhalers  Please call if we can help

## 2016-05-21 NOTE — Progress Notes (Signed)
HPI female former smoker followed for allergic rhinitis, asthma/COPD, complicated by embolic stroke RMCA/ ASD closed with septal occluder 2016 PFT 11/02/13- mild obstructive airways disease with insignificant response to bronchodilator  --------------------------------------------------------------------------  05/22/2015-61 year old female former smoker followed for allergic rhinitis, asthma/COPD complicated by embolic stroke RMCA/ ASD closed 2016 Follows for: COPD. Pt c/o dry cough, sneezing, pnd and itchy nose. Pt denies wheeze/SOB/CP/tightness.  Seen by Dr. Sherene Sires 04/28/2015 with complaint of dry cough, chest tightness, wheezing shortness of breath 1 week, treated as reflux cough Does not recognize any reflux issues. Reports nasal congestion and drainage associated with pollen over the last 2 weeks. Residual dry cough. Dr. Jeannetta Nap gave Tussionex. She has had less cough and wheeze for several years since ASD repair.  04/22/2016-61 year old female former smoker followed for allergic rhinitis, asthma/COPD, complicated by embolic stroke RMCA/ASD closed 2016 FOLLOWS FOR: Pt c/o cough and head congestion x 2 weeks. Pt notes some SOB, chest tightness and wheezing. Pt using Tessalon for cough. Denies fever. Requests a neb treatment today.  Has had bad cold recently and walked in today requesting nebulizer treatment for chest tightness. Denies f/s/purulent or chest pain  05/21/2016- 61 year old female former smoker followed for allergic rhinitis, asthma/COPD complicated by embolic stroke RMCA/ ASD closed with septal occluder 2016 Feeling much better. No recent infection. Continue Singulair but with only occasional need for rescue inhaler and no routine wheezing CXR 05/09/2016- NAD                  ROS-see HPI Constitutional:   No-   weight loss, night sweats, fevers, chills, fatigue, lassitude. HEENT:   No-  headaches, difficulty swallowing, tooth/dental problems, sore throat,       No-  sneezing,  itching, ear ache,  +nasal congestion, post nasal drip,  CV:  No-   chest pain, orthopnea, PND, swelling in lower extremities, anasarca, dizziness, palpitations Resp:   shortness of breath with exertion or at rest.                 productive cough,  No non-productive cough,  No- coughing up of blood.              No change in color of mucus.  No wheezing.   Skin: No-   rash or lesions. GI:  No-   heartburn, indigestion, abdominal pain, nausea, vomiting, GU:  MS:  No-   joint pain or swelling.   Neuro-     nothing unusual Psych:  No- change in mood or affect. No depression or anxiety.  No memory loss.  OBJ- Physical Exam  exam back to baseline General- Alert, Oriented, Affect-appropriate, Distress- none acute Skin- rash-none, lesions- none, excoriation- none Lymphadenopathy- none Head- atraumatic            Eyes- Gross vision intact, PERRLA, conjunctivae and secretions clear            Ears- Hearing, canals-normal            Nose- + turbinate edema, no-Septal dev, mucus, polyps, erosion, perforation             Throat- Mallampati II , mucosa cobblestoned , drainage- none, tonsils- atrophic Neck- flexible , trachea midline, no stridor , thyroid nl, carotid no bruit Chest - symmetrical excursion , unlabored           Heart/CV- RRR , no murmur , no gallop  , no rub, nl s1 s2                           -  JVD- none , edema- none, stasis changes- none, varices- none           Lung- + diminished but clear, wheeze- none, cough- none , dullness-none, rub- none           Chest wall-  Abd-  Br/ Gen/ Rectal- Not done, not indicated Extrem- cyanosis- none, clubbing, none, atrophy- none, strength- nl Neuro- grossly intact to observation

## 2016-05-23 NOTE — Telephone Encounter (Signed)
Called patient. Advised would have scheduler to call and place schedule for botox appt. Patient agreed.

## 2016-05-26 NOTE — Assessment & Plan Note (Signed)
Reactive airways component under control at this time with occasional use of rescue inhaler. Recent bronchitis has resolved.

## 2016-05-26 NOTE — Assessment & Plan Note (Signed)
Closed with septal occluder 2016. Followed by cardiology

## 2016-07-15 ENCOUNTER — Telehealth: Payer: Self-pay | Admitting: Neurology

## 2016-07-15 ENCOUNTER — Ambulatory Visit: Payer: BLUE CROSS/BLUE SHIELD | Admitting: Neurology
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
## 2016-07-16 NOTE — Telephone Encounter (Signed)
It's okay to schedule for a botox outside the clinic day.

## 2016-07-16 NOTE — Telephone Encounter (Signed)
Is there a certain day or time that works better for you? Or just first opening you have for appt

## 2016-07-16 NOTE — Telephone Encounter (Signed)
First opening is fine.

## 2016-08-06 ENCOUNTER — Ambulatory Visit (INDEPENDENT_AMBULATORY_CARE_PROVIDER_SITE_OTHER): Payer: BLUE CROSS/BLUE SHIELD | Admitting: Neurology

## 2016-08-06 DIAGNOSIS — G43709 Chronic migraine without aura, not intractable, without status migrainosus: Secondary | ICD-10-CM | POA: Diagnosis not present

## 2016-08-06 MED ORDER — ONABOTULINUMTOXINA 100 UNITS IJ SOLR
155.0000 [IU] | Freq: Once | INTRAMUSCULAR | Status: AC
  Administered 2016-08-06: 155 [IU] via INTRAMUSCULAR

## 2016-08-06 NOTE — Progress Notes (Signed)
[facility]   Procedure Note Botox  Attending: Dr. Everlena CooperJaffe  Preoperative Diagnosis(es): Chronic migraine  Consent obtained from: The patient Benefits discussed included, but were not limited to decreased muscle tightness, increased joint range of motion, and decreased pain.  Risk discussed included, but were not limited pain and discomfort, bleeding, bruising, excessive weakness, venous thrombosis, muscle atrophy and dysphagia.  Anticipated outcomes of the procedure as well as he risks and benefits of the alternatives to the procedure, and the roles and tasks of the personnel to be involved, were discussed with the patient, and the patient consents to the procedure and agrees to proceed. A copy of the patient medication guide was given to the patient which explains the blackbox warning.  Patients identity and treatment sites confirmed Yes.  .  Details of Procedure: Skin was cleaned with alcohol. Prior to injection, the needle plunger was aspirated to make sure the needle was not within a blood vessel.  There was no blood retrieved on aspiration.    Following is a summary of the muscles injected  And the amount of Botulinum toxin used:  Dilution 200 units of Botox was reconstituted with 4 ml of preservative free normal saline. Time of reconstitution: At the time of the office visit (<30 minutes prior to injection)   Injections  155 total units of Botox was injected with a 30 gauge needle.  Injection Sites: L occipitalis: 15 units- 3 sites  R occiptalis: 15 units- 3 sites  L upper trapezius: 15 units- 3 sites R upper trapezius: 15 units- 3 sits          L paraspinal: 10 units- 2 sites R paraspinal: 10 units- 2 sites  Face L frontalis(2 injection sites):10 units   R frontalis(2 injection sites):10 units         L corrugator: 5 units   R corrugator: 5 units           Procerus: 5 units   L temporalis: 20 units R temporalis: 20 units   Agent:  200 units of botulinum Type A  (Onobotulinum Toxin type A) was reconstituted with 4 ml of preservative free normal saline.  Time of reconstitution: At the time of the office visit (<30 minutes prior to injection)     Total injected (Units): 155  Total wasted (Units): none wasted  Patient tolerated procedure well without complications.   Reinjection is anticipated in 3 months. Return to clinic in 4.5 months  Drema DallasAdam R Jaffe, DO

## 2016-09-12 ENCOUNTER — Other Ambulatory Visit: Payer: Self-pay | Admitting: Internal Medicine

## 2016-10-11 ENCOUNTER — Other Ambulatory Visit: Payer: Self-pay | Admitting: Internal Medicine

## 2016-11-08 ENCOUNTER — Ambulatory Visit: Payer: BLUE CROSS/BLUE SHIELD | Admitting: Neurology

## 2016-11-18 ENCOUNTER — Encounter: Payer: Self-pay | Admitting: Internal Medicine

## 2016-11-18 ENCOUNTER — Ambulatory Visit (INDEPENDENT_AMBULATORY_CARE_PROVIDER_SITE_OTHER): Payer: BLUE CROSS/BLUE SHIELD | Admitting: Internal Medicine

## 2016-11-18 VITALS — BP 118/80 | HR 87 | Ht 66.0 in | Wt 181.2 lb

## 2016-11-18 DIAGNOSIS — Q211 Atrial septal defect, unspecified: Secondary | ICD-10-CM

## 2016-11-18 DIAGNOSIS — J3089 Other allergic rhinitis: Secondary | ICD-10-CM

## 2016-11-18 DIAGNOSIS — J302 Other seasonal allergic rhinitis: Secondary | ICD-10-CM | POA: Diagnosis not present

## 2016-11-18 DIAGNOSIS — J449 Chronic obstructive pulmonary disease, unspecified: Secondary | ICD-10-CM | POA: Diagnosis not present

## 2016-11-18 DIAGNOSIS — J45901 Unspecified asthma with (acute) exacerbation: Secondary | ICD-10-CM | POA: Diagnosis not present

## 2016-11-18 MED ORDER — ALBUTEROL SULFATE HFA 108 (90 BASE) MCG/ACT IN AERS: 2.0000 | INHALATION_SPRAY | RESPIRATORY_TRACT | 12 refills | Status: AC | PRN

## 2016-11-18 MED ORDER — METHYLPREDNISOLONE ACETATE 80 MG/ML IJ SUSP
80.0000 mg | Freq: Once | INTRAMUSCULAR | Status: AC
  Administered 2016-11-18: 80 mg via INTRAMUSCULAR

## 2016-11-18 MED ORDER — LEVALBUTEROL HCL 0.63 MG/3ML IN NEBU
0.6300 mg | INHALATION_SOLUTION | Freq: Once | RESPIRATORY_TRACT | Status: AC
  Administered 2016-11-18: 0.63 mg via RESPIRATORY_TRACT

## 2016-11-18 MED ORDER — BENZONATATE 200 MG PO CAPS: 200.0000 mg | ORAL_CAPSULE | Freq: Three times a day (TID) | ORAL | 12 refills | Status: DC | PRN

## 2016-11-18 NOTE — Assessment & Plan Note (Signed)
She denies increased nasal congestion or drainage associated with recent wet/hot summer weather

## 2016-11-18 NOTE — Patient Instructions (Signed)
Order neb xop 0.63    Dx exacerbation asthmatic bronchitis            Depo 80   Refill scripts sent for albuterol rescue inhaler, and benzonatate perles  Please call if we can help

## 2016-11-18 NOTE — Assessment & Plan Note (Signed)
Acute exacerbation, nonspecific-viral or related to hot humid weather (which is her theory) Plan-nebulizer treatments Xopenex, Depo-Medrol, refill rescue inhaler

## 2016-11-18 NOTE — Assessment & Plan Note (Signed)
She denies any problems associated with repaired ASD

## 2016-11-18 NOTE — Progress Notes (Signed)
HPI female former smoker followed for allergic rhinitis, asthma/COPD, complicated by embolic stroke RMCA/ ASD closed with septal occluder 2016 PFT 11/02/13- mild obstructive airways disease with insignificant response to bronchodilator  -------------------------------------------------------------------------  05/21/2016- 61 year old female former smoker followed for allergic rhinitis, asthma/COPD complicated by embolic stroke RMCA/ ASD closed with septal occluder 2016 Feeling much better. No recent infection. Continue Singulair but with only occasional need for rescue inhaler and no routine wheezing CXR 05/09/2016- NAD  8/2 0/18- female former smoker followed for allergic rhinitis, asthma/COPD, complicated by embolic stroke RMCA/ ASD closed 2016 6 month for COPD. States breathing has been great since last visit.  Albuterol HFA,Bevespi, Singulair Had been fairly stable through the summer. This morning woke with incidental cough and wheeze. Denies fever or sore throat and denies recognized reflux event. Cough is producing some clear mucus. Denies nasal congestion.                  ROS-see HPI   + = pos Constitutional:   No-   weight loss, night sweats, fevers, chills, fatigue, lassitude. HEENT:   No-  headaches, difficulty swallowing, tooth/dental problems, sore throat,       No-  sneezing, itching, ear ache,  +nasal congestion, post nasal drip,  CV:  No-   chest pain, orthopnea, PND, swelling in lower extremities, anasarca, dizziness, palpitations Resp:   shortness of breath with exertion or at rest.                 +productive cough,  No non-productive cough,  No- coughing up of blood.              No change in color of mucus.  + wheezing.   Skin: No-   rash or lesions. GI:  No-   heartburn, indigestion, abdominal pain, nausea, vomiting, GU:  MS:  No-   joint pain or swelling.   Neuro-     nothing unusual Psych:  No- change in mood or affect. No depression or anxiety.  No memory  loss.  OBJ- Physical Exam  stable baseline exam General- Alert, Oriented, Affect-appropriate, Distress- none acute Skin- rash-none, lesions- none, excoriation- none Lymphadenopathy- none Head- atraumatic            Eyes- Gross vision intact, PERRLA, conjunctivae and secretions clear            Ears- Hearing, canals-normal            Nose- + turbinate edema, no-Septal dev, mucus, polyps, erosion, perforation             Throat- Mallampati II , mucosa cobblestoned , drainage- none, tonsils- atrophic Neck- flexible , trachea midline, no stridor , thyroid nl, carotid no bruit Chest - symmetrical excursion , unlabored           Heart/CV- RRR , no murmur , no gallop  , no rub, nl s1 s2                           - JVD- none , edema- none, stasis changes- none, varices- none           Lung- + diminished but clear, wheeze- none, cough- none , dullness-none, rub- none           Chest wall-  Abd-  Br/ Gen/ Rectal- Not done, not indicated Extrem- cyanosis- none, clubbing, none, atrophy- none, strength- nl Neuro- grossly intact to observation

## 2016-11-25 ENCOUNTER — Ambulatory Visit: Payer: BLUE CROSS/BLUE SHIELD | Admitting: Neurology

## 2016-12-04 ENCOUNTER — Other Ambulatory Visit: Payer: Self-pay | Admitting: Internal Medicine

## 2016-12-04 MED ORDER — MONTELUKAST SODIUM 10 MG PO TABS: ORAL_TABLET | ORAL | 3 refills | Status: DC

## 2016-12-20 ENCOUNTER — Ambulatory Visit: Payer: BLUE CROSS/BLUE SHIELD | Admitting: Neurology

## 2016-12-24 ENCOUNTER — Ambulatory Visit: Payer: BLUE CROSS/BLUE SHIELD | Admitting: Neurology

## 2016-12-24 DIAGNOSIS — Z029 Encounter for administrative examinations, unspecified: Secondary | ICD-10-CM

## 2017-03-19 ENCOUNTER — Telehealth: Payer: Self-pay | Admitting: Internal Medicine

## 2017-03-19 NOTE — Telephone Encounter (Signed)
Spoke with pt. She has been scheduled to see SG tomorrow at 9am. Nothing further was needed.

## 2017-03-20 ENCOUNTER — Encounter: Payer: Self-pay | Admitting: Acute Care

## 2017-03-20 ENCOUNTER — Ambulatory Visit (INDEPENDENT_AMBULATORY_CARE_PROVIDER_SITE_OTHER): Payer: BLUE CROSS/BLUE SHIELD | Admitting: Acute Care

## 2017-03-20 VITALS — BP 110/80 | HR 77 | Ht 66.0 in | Wt 189.4 lb

## 2017-03-20 DIAGNOSIS — J449 Chronic obstructive pulmonary disease, unspecified: Secondary | ICD-10-CM

## 2017-03-20 DIAGNOSIS — Z87891 Personal history of nicotine dependence: Secondary | ICD-10-CM | POA: Diagnosis not present

## 2017-03-20 HISTORY — DX: Personal history of nicotine dependence: Z87.891

## 2017-03-20 MED ORDER — PREDNISONE 10 MG PO TABS: ORAL_TABLET | ORAL | 0 refills | Status: DC

## 2017-03-20 MED ORDER — METHYLPREDNISOLONE ACETATE 80 MG/ML IJ SUSP
80.0000 mg | Freq: Once | INTRAMUSCULAR | Status: AC
  Administered 2017-03-20: 80 mg via INTRAMUSCULAR

## 2017-03-20 MED ORDER — GLYCOPYRROLATE-FORMOTEROL 9-4.8 MCG/ACT IN AERO: 2.0000 | INHALATION_SPRAY | Freq: Two times a day (BID) | RESPIRATORY_TRACT | 0 refills | Status: DC

## 2017-03-20 NOTE — Patient Instructions (Addendum)
It is good to see you today We will give you a Depo Medrol injection today 80 mg. Start Prednisone taper 03/21/2017>>; 10 mg tablets:  3 tabs x 2 days, 2 tabs x 2 days 1 tab x 2 days then stop. Continue Tessalon Perles as prescribed. Add Mucinex 1200 mg once a day with a full glass of water. Continue Flonase and Singulair as you have been doing Follow up in as needed. Referral to Lung Cancer Screening Program Note your daily symptoms > remember "red flags" for COPD:  Increase in cough, increase in sputum production, increase in shortness of breath or activity tolerance. If you notice these symptoms, please call to be seen.   .Please contact office for sooner follow up if symptoms do not improve or worsen or seek emergency care .

## 2017-03-20 NOTE — Assessment & Plan Note (Signed)
Mild Flare asthma /COPD Afebrile No change in secretions Plan We will give you a Depo Medrol injection today 80 mg. Start Prednisone taper 03/21/2017>>; 10 mg tablets:  3 tabs x 2 days, 2 tabs x 2 days 1 tab x 2 days then stop. Continue Tessalon Perles as prescribed. Add Mucinex 1200 mg once a day with a full glass of water. Continue Flonase and Singulair as you have been doing Follow up in as needed. Note your daily symptoms > remember "red flags" for COPD:  Increase in cough, increase in sputum production, increase in shortness of breath or activity tolerance. If you notice these symptoms, please call to be seen.   .Please contact office for sooner follow up if symptoms do not improve or worsen or seek emergency care .

## 2017-03-20 NOTE — Progress Notes (Signed)
History of Present Illness Kim SeltzerConnie B Decker is a 61 y.o. female former smoker with asthmatic bronchitis, allergic rhinitis, COPD.[name]. She is followed by Dr. Maple HudsonYoung.   03/20/2017 Acute OV for cough and bronchitis Pt. Presents today for .increased cough and congestion. She states she has had symptoms for about 1 week to 10 days..She states when she coughs it is hard and painful.She tried [employer] Perles which helped but didn't resolve the cough. She states the cough is sometimes productive. Secretions are clear. She is using her albuterol inhaler  About twice daily. She is using her Singulair and her Flonase as prescribed. She denies fever, chest pain, orthopnea of hemoptysis.  Test Results: PFT 11/02/13- mild obstructive airways disease with insignificant response to bronchodilator   CBC Latest Ref Rng & Units 07/31/2015 07/27/2014 06/17/2014  WBC 4.0 - 10.5 K/uL 7.2 11.7(H) 8.0  Hemoglobin 12.0 - 15.0 g/dL 16.113.3 09.613.3 04.512.6  Hematocrit 36.0 - 46.0 % 41.0 39.8 38.9  Platelets 150 - 400 K/uL 253 257.0 265    BMP Latest Ref Rng & Units 07/31/2015 07/27/2014 06/17/2014  Glucose 65 - 99 mg/dL 91 87 409(W114(H)  BUN 6 - 20 mg/dL 17 22 15   Creatinine 0.44 - 1.00 mg/dL 1.19(J1.10(H) 4.780.87 2.950.92  Sodium 135 - 145 mmol/L 140 140 142  Potassium 3.5 - 5.1 mmol/L 3.8 3.6 3.8  Chloride 101 - 111 mmol/L 106 104 107  CO2 22 - 32 mmol/L 26 31 26   Calcium 8.9 - 10.3 mg/dL 8.9 9.2 9.1     PFT    Component Value Date/Time   FEV1PRE 1.75 11/02/2013 1455   FEV1POST 1.86 11/02/2013 1455   FVCPRE 2.48 11/02/2013 1455   FVCPOST 2.81 11/02/2013 1455   PREFEV1FVCRT 71 11/02/2013 1455   PSTFEV1FVCRT 66 11/02/2013 1455    No results found.   Past medical hx Past Medical History:  Diagnosis Date  . ADHD (attention deficit hyperactivity disorder)   . ALLERGIC RHINITIS   . Asthma   . Claustrophobia    unable to ride in elavator;leave room door open  . COPD (chronic obstructive pulmonary disease) (HCC)   . History of  frequent urinary tract infections   . Migraine headache    since childhood  . PONV (postoperative nausea and vomiting)    Pt states Zofran does not relieve sx; prefers Phenergan  . Post-menopause on HRT (hormone replacement therapy)   . Restless leg   . Stroke [facility](HCC)      Social History   Tobacco Use  . Smoking status: Former Smoker    Packs/day: 2.00    Years: 25.00    Pack years: 50.00    Types: Cigarettes    Last attempt to quit: 04/02/2003    Years since quitting: 13.9  . Smokeless tobacco: Never Used  Substance Use Topics  . Alcohol use: No  . Drug use: No    Ms.[name] reports that she quit smoking about 13 years ago. Her smoking use included cigarettes. She has a 50.00 pack-year smoking history. she has never used smokeless tobacco. She reports that she does not drink alcohol or use drugs.  Tobacco Cessation: Former smoker quit 2005 with a 50 pack year history.  Past surgical hx, Family hx, Social hx all reviewed.  Current Outpatient Medications on File Prior to Visit  Medication Sig  . acetaminophen (TYLENOL) 325 MG tablet Take 650 mg by mouth every 6 (six) hours as needed for headache.  . albuterol (PROVENTIL HFA) 108 (90 Base) MCG/ACT inhaler Inhale  2 puffs into the lungs every 4 (four) hours as needed for wheezing or shortness of breath. ProAir  . aspirin EC 81 MG tablet Take 81 mg by mouth daily.  . benzonatate (TESSALON) 200 MG capsule Take 1 capsule (200 mg total) by mouth 3 (three) times daily as needed for cough.  . clonazePAM (KLONOPIN) 2 MG tablet Take 2 mg by mouth at bedtime. For restless legs  . escitalopram (LEXAPRO) 20 MG tablet Take 20 mg by mouth daily.   . montelukast (SINGULAIR) 10 MG tablet TAKE 1 TABLET (10 MG TOTAL) BY MOUTH DAILY.  . Multiple Vitamin (MULTIVITAMIN WITH MINERALS) TABS tablet Take 1 tablet by mouth daily.  . OnabotulinumtoxinA (BOTOX IJ) Inject as directed. Injection each month for migraines  . Glycopyrrolate-Formoterol (BEVESPI  AEROSPHERE) 9-4.8 MCG/ACT AERO Inhale 2 puffs into the lungs 2 (two) times daily. (Patient not taking: Reported on 03/20/2017)   No current facility-administered medications on file prior to visit.      Allergies  Allergen Reactions  . Penicillins Swelling  . Cefuroxime Swelling  . Excedrin Tension Headache [Acetaminophen-Caffeine] Swelling  . Hydrocodone Nausea Only  . Codeine Nausea Only    Review Of Systems:  Constitutional:   No  weight loss, night sweats,  Fevers, chills, fatigue, or  lassitude.  HEENT:   No headaches,  Difficulty swallowing,  Tooth/dental problems, or  Sore throat,                No sneezing, itching, ear ache, nasal congestion,+ post nasal drip,   CV:  No chest pain, + chest tightness, No  Orthopnea, PND, swelling in lower extremities, anasarca, dizziness, palpitations, syncope.   GI  No heartburn, indigestion, abdominal pain, nausea, vomiting, diarrhea, change in bowel habits, loss of appetite, bloody stools.   Resp: No shortness of breath with exertion or at rest.  No excess mucus, + productive cough,  No non-productive cough,  No coughing up of blood.  No change in color of mucus.  + wheezing.  No chest wall deformity  Skin: no rash or lesions.  GU: no dysuria, change in color of urine, no urgency or frequency.  No flank pain, no hematuria   MS:  No joint pain or swelling.  No decreased range of motion.  No back pain.  Psych:  No change in mood or affect. No depression or anxiety.  No memory loss.   Vital Signs BP 110/80 (BP Location: Left Arm, Cuff Size: Normal)   Pulse 77   Ht 5\' 6"  (1.676 m)   Wt 189 lb 6.4 oz (85.9 kg)   SpO2 97%   BMI 30.57 kg/m    Physical Exam:  General- No distress,  A&Ox3, pleasant and appropriate ENT: No sinus tenderness, TM clear, pale nasal mucosa, no oral exudate,+ post nasal drip, no LAN Cardiac: S1, S2, regular rate and rhythm, no murmur Chest: No wheeze/ rales/ dullness; no accessory muscle use, no nasal  flaring, no sternal retractions Abd.: Soft Non-tender, non-distended Ext: No clubbing cyanosis, edema Neuro:  normal strength, MAE x 4, A &Ox3 Skin: No rashes, warm and dry, intact Psych: normal mood and behavior, appropriate   Assessment/Plan  COPD mixed type Mild Flare asthma /COPD Afebrile No change in secretions Plan We will give you a Depo Medrol injection today 80 mg. Start Prednisone taper 03/21/2017>>; 10 mg tablets:  3 tabs x 2 days, 2 tabs x 2 days 1 tab x 2 days then stop. Continue Tessalon Perles as prescribed. Add Mucinex  1200 mg once a day with a full glass of water. Continue Flonase and Singulair as you have been doing Follow up in as needed. Note your daily symptoms > remember "red flags" for COPD:  Increase in cough, increase in sputum production, increase in shortness of breath or activity tolerance. If you notice these symptoms, please call to be seen.   .Please contact office for sooner follow up if symptoms do not improve or worsen or seek emergency care .    Former smoker Former smoker quit 2005 with a 50 pack year smoking history Plan: Referral to Lung Cancer Screening Program    Bevelyn NgoSarah F Kamdyn Covel, NP 03/20/2017  9:36 AM

## 2017-03-20 NOTE — Assessment & Plan Note (Signed)
Former smoker quit 2005 with a 50 pack year smoking history Plan: Referral to Lung Cancer Screening Program

## 2017-03-27 ENCOUNTER — Telehealth: Payer: Self-pay | Admitting: Internal Medicine

## 2017-03-27 NOTE — Telephone Encounter (Signed)
LMTC x 1  

## 2017-03-31 NOTE — Telephone Encounter (Signed)
Called pt who stated she is interested in the lung screening program. Pt stated that she had received a phone call from [facility] but Angelique BlonderDenise was unable to reach her and left a message for pt to call back.  I stated to pt that I would [facility] for [facility] to have so she can call pt back. Pt expressed understanding.   Angelique Blonderenise, please advise on this.

## 2017-04-03 NOTE — Telephone Encounter (Signed)
Spoke with pt to schedule SDMV and CT.  Pt was advised that [employer] does not cover this.  Pt was given the out of pocket cost options for this.  Pt would like to defer for a couple months and call her back .  Will close this message and refer to referral notes.

## 2017-07-08 ENCOUNTER — Ambulatory Visit
Admission: RE | Admit: 2017-07-08 | Discharge: 2017-07-08 | Disposition: A | Discharge status: Home / Self Care | Payer: BLUE CROSS/BLUE SHIELD | Source: Ambulatory Visit | Attending: Family Medicine | Admitting: Family Medicine

## 2017-07-08 ENCOUNTER — Other Ambulatory Visit: Payer: Self-pay | Admitting: Family Medicine

## 2017-07-08 DIAGNOSIS — Z1231 Encounter for screening mammogram for malignant neoplasm of breast: Secondary | ICD-10-CM

## 2017-07-10 ENCOUNTER — Ambulatory Visit (INDEPENDENT_AMBULATORY_CARE_PROVIDER_SITE_OTHER): Payer: BLUE CROSS/BLUE SHIELD | Admitting: Internal Medicine

## 2017-07-10 ENCOUNTER — Encounter: Payer: Self-pay | Admitting: Internal Medicine

## 2017-07-10 VITALS — BP 148/90 | HR 94 | Ht 66.0 in | Wt 190.4 lb

## 2017-07-10 DIAGNOSIS — J302 Other seasonal allergic rhinitis: Secondary | ICD-10-CM | POA: Diagnosis not present

## 2017-07-10 DIAGNOSIS — J449 Chronic obstructive pulmonary disease, unspecified: Secondary | ICD-10-CM | POA: Diagnosis not present

## 2017-07-10 DIAGNOSIS — J3089 Other allergic rhinitis: Secondary | ICD-10-CM | POA: Diagnosis not present

## 2017-07-10 MED ORDER — METHYLPREDNISOLONE ACETATE 80 MG/ML IJ SUSP
80.0000 mg | Freq: Once | INTRAMUSCULAR | Status: AC
Start: 2017-07-10 — End: 2017-07-10
  Administered 2017-07-10: 80 mg via INTRAMUSCULAR

## 2017-07-10 MED ORDER — PHENYLEPHRINE HCL 1 % NA SOLN
3.0000 [drp] | Freq: Once | NASAL | Status: AC
  Administered 2017-07-10: 3 [drp] via NASAL

## 2017-07-10 NOTE — Progress Notes (Signed)
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  FVC 1.62/45%, FEV1 1.12/40%, ratio 0.69, FEF 25-75% 0.69/27% -------------------------------------------------------------------------  8/2 0/18- female former smoker followed for allergic rhinitis, asthma/COPD, complicated by embolic stroke RMCA/ ASD closed 2016 6 month for COPD. States breathing has been great since last visit.  Albuterol HFA,Bevespi, Singulair Had been fairly stable through the summer. This morning woke with incidental cough and wheeze. Denies fever or sore throat and denies recognized reflux event. Cough is producing some clear mucus. Denies nasal congestion.  07/10/17-61 yo female former smoker followed for allergic rhinitis, asthma/COPD, complicated by embolic stroke RMCA/ ASD closed 2016 Albuterol HFA, Bevespi, Singulair Seen for acute visit in December with bronchitis.  Treated with Depo-Medrol, prednisone taper, Tessalon Perles, Mucinex Nasal congestion, postnasal drip with no fever.  Cough.  Antihistamine helps some.  No fever.  Asking for CXR. Office Spirometry 07/10/17-severe obstructive airways disease with restriction of exhaled volume.  FVC 1.62/45%, FEV1 1.12/40%, ratio 0.69, FEF 25-75% 0.69/27% CXR 05/09/16 No active cardiopulmonary disease.                  ROS-see HPI   + = positive Constitutional:   No-   weight loss, night sweats, fevers, chills, fatigue, lassitude. HEENT:   No-  headaches, difficulty swallowing, tooth/dental problems, sore throat,       No-  sneezing, itching, ear ache,  +nasal congestion, post nasal drip,  CV:  No-   chest pain, orthopnea, PND, swelling in lower extremities, anasarca, dizziness, palpitations Resp:    shortness of breath with exertion or at rest.                 +productive cough,  No non-productive cough,  No- coughing up of blood.              No change in color of mucus.  + wheezing.   Skin: No-   rash or lesions. GI:  No-   heartburn, indigestion, abdominal pain, nausea, vomiting, GU:  MS:  No-   joint pain or swelling.   Neuro-     nothing unusual Psych:  No- change in mood or affect. No depression or anxiety.  No memory loss.  OBJ- Physical Exam   General- Alert, Oriented, Affect-appropriate, Distress- none acute Skin- rash-none, lesions- none, excoriation- none Lymphadenopathy- none Head- atraumatic            Eyes- Gross vision intact, PERRLA, conjunctivae and secretions clear            Ears- Hearing, canals-normal            Nose- + turbinate edema, no-Septal dev, mucus, polyps, erosion, perforation             Throat- Mallampati II , mucosa cobblestoned , drainage- none, tonsils- atrophic Neck- flexible , trachea midline, no stridor , thyroid nl, carotid no bruit Chest - symmetrical excursion , unlabored           Heart/CV- RRR , no murmur , no gallop  , no rub, nl s1 s2                           - JVD- none , edema- none, stasis changes- none, varices- none  Lung- + diminished but clear, wheeze- none, cough- none , dullness-none, rub- none           Chest wall-  Abd-  Br/ Gen/ Rectal- Not done, not indicated Extrem- cyanosis- none, clubbing, none, atrophy- none, strength- nl Neuro- grossly intact to observation

## 2017-07-10 NOTE — Patient Instructions (Addendum)
Order- office spirometry     Dx COPD mixed type  Order- neb neo nasal    Dx Seasonal and perennial allergic rhinitis           -  Depo 80  Suggest daily flonase 1-2 puffs each nostril, and an otc antihistamine like Claritin/ loratadine now, while the pollen is heavy.  Ok to continue with your regular breathing meds  We can keep the August 20 appointment

## 2017-07-12 ENCOUNTER — Encounter: Payer: Self-pay | Admitting: Internal Medicine

## 2017-07-12 NOTE — Assessment & Plan Note (Signed)
Increased nasal congestion and postnasal drip may reflect seasonal allergy. Plan-nasal nebulizer decongestant treatment, Depo-Medrol.  Discussed antihistamines and Flonase.

## 2017-07-12 NOTE — Assessment & Plan Note (Signed)
She feels more congested than at baseline but is not very specific.  Office spirometry may reflect exacerbation with more severe obstruction than noted on PFT in 2015. Plan-depomedrol, CXR

## 2017-07-22 ENCOUNTER — Encounter: Payer: Self-pay | Admitting: Neurology

## 2017-07-22 ENCOUNTER — Ambulatory Visit (INDEPENDENT_AMBULATORY_CARE_PROVIDER_SITE_OTHER): Payer: BLUE CROSS/BLUE SHIELD | Admitting: Neurology

## 2017-07-22 VITALS — BP 146/86 | HR 85 | Ht 66.0 in | Wt 185.0 lb

## 2017-07-22 DIAGNOSIS — G3184 Mild cognitive impairment, so stated: Secondary | ICD-10-CM

## 2017-07-22 DIAGNOSIS — F419 Anxiety disorder, unspecified: Secondary | ICD-10-CM

## 2017-07-22 DIAGNOSIS — G43009 Migraine without aura, not intractable, without status migrainosus: Secondary | ICD-10-CM

## 2017-07-22 DIAGNOSIS — R030 Elevated blood-pressure reading, without diagnosis of hypertension: Secondary | ICD-10-CM | POA: Diagnosis not present

## 2017-07-22 DIAGNOSIS — I63411 Cerebral infarction due to embolism of right middle cerebral artery: Secondary | ICD-10-CM

## 2017-07-22 MED ORDER — VENLAFAXINE HCL ER 75 MG PO CP24: 75.0000 mg | ORAL_CAPSULE | Freq: Every day | ORAL | 3 refills | Status: DC

## 2017-07-22 NOTE — Patient Instructions (Addendum)
1.  Stop Lexapro.  Start venlafaxine XR 75mg  daily with breakfast to treat both migraine and anxiety.  Caution when taken with Vyvanse due to possibility of serotonin syndrome (see below) 2.  Limit use of pain relievers to no more than 2 days out of week to prevent rebound headache 3.  Keep headache diary 4.  Continue aspirin 81mg  daily 5.  Follow up in 3 months.  Serotonin Syndrome Serotonin is a brain chemical that regulates the nervous system, which includes the brain, spinal cord, and nerves. Serotonin appears to play a role in all types of behavior, including appetite, emotions, movement, thinking, and response to stress. Excessively high levels of serotonin in the body can cause serotonin syndrome, which is a very dangerous condition. What are the causes? This condition can be caused by taking medicines or drugs that increase the level of serotonin in your body. These include:  Antidepressant medicines.  Migraine medicines.  Certain pain medicines.  Certain recreational drugs, including ecstasy, LSD, cocaine, and amphetamines.  Over-the-counter cough or cold medicines that contain dextromethorphan.  Certain herbal supplements, including St. John's wort, ginseng, and nutmeg.  This condition usually occurs when you take these medicines or drugs in combination, but it can also happen with a high dose of a single medicine or drug. What increases the risk? This condition is more likely to develop in:  People who have recently increased the dosage of medicine that increases the serotonin level.  People who just started taking medicine that increases the serotonin level.  What are the signs or symptoms? Symptoms of this condition usually happens within several hours of a medicine change. Symptoms include:  Headache.  Muscle twitching or stiffness.  Diarrhea.  Confusion.  Restlessness or agitation.  Shivering or goose bumps.  Loss of muscle coordination.  Rapid heart  rate.  Sweating.  Severe cases of serotonin syndromecan cause:  Irregular heartbeat.  Seizures.  Loss of consciousness.  High fever.  How is this diagnosed? This condition is diagnosed with a medical history and physical exam. You will be asked aboutyour symptoms and your use of medicines and recreational drugs. Your health care provider may also order lab work or additional tests to rule out other causes of your symptoms. How is this treated? The treatment for this condition depends on the severity of your symptoms. For mild cases, stopping the medicine that caused your condition is usually all that is needed. For moderate to severe cases, hospitalization is required to monitor you and to prevent further muscle damage. Follow these instructions at home:  Take over-the-counter and prescription medicines only as told by your health care provider. This is important.  Check with your health care provider before you start taking any new prescriptions, over-the-counter medicines, herbs, or supplements.  Avoid combining any medicines that can cause this condition to occur.  Keep all follow-up visits as told by your health care provider.This is important.  Maintain a healthy lifestyle. ? Eat healthy foods. ? Get plenty of sleep. ? Exercise regularly. ? Do not drink alcohol. ? Do not use recreational drugs. Contact a health care provider if:  Medicines do not seem to be helping.  Your symptoms do not improve or they get worse.  You have trouble taking care of yourself. Get help right away if:  You have worsening confusion, severe headache, chest pain, high fever, seizures, or loss of consciousness.  You have serious thoughts about hurting yourself or others.  You experience serious side effects of medicine,  such as swelling of your face, lips, tongue, or throat. This information is not intended to replace advice given to you by your health care provider. Make sure you  discuss any questions you have with your health care provider. Document Released: 04/25/2004 Document Revised: 11/11/2015 Document Reviewed: 03/31/2014 Elsevier Interactive Patient Education  2018 [employer]   Mediterranean Diet A Mediterranean diet refers to food and lifestyle choices that are based on the traditions of countries located on the [place]. This way of eating has been shown to help prevent certain conditions and improve outcomes for people who have chronic diseases, like kidney disease and heart disease. What are tips for following this plan? Lifestyle  Cook and eat meals together with your family, when possible.  Drink enough fluid to keep your urine clear or pale yellow.  Be physically active every day. This includes: ? Aerobic exercise like running or swimming. ? Leisure activities like gardening, walking, or housework.  Get 7-8 hours of sleep each night.  If recommended by your health care provider, drink red wine in moderation. This means 1 glass a day for nonpregnant women and 2 glasses a day for men. A glass of wine equals 5 oz (150 mL). Reading food labels  Check the serving size of packaged foods. For foods such as rice and pasta, the serving size refers to the amount of cooked product, not dry.  Check the total fat in packaged foods. Avoid foods that have saturated fat or trans fats.  Check the ingredients list for added sugars, such as corn syrup. Shopping  At the grocery store, buy most of your food from the areas near the walls of the store. This includes: ? Fresh fruits and vegetables (produce). ? Grains, beans, nuts, and seeds. Some of these may be available in unpackaged forms or large amounts (in bulk). ? Fresh seafood. ? Poultry and eggs. ? Low-fat dairy products.  Buy whole ingredients instead of prepackaged foods.  Buy fresh fruits and vegetables in-season from local farmers markets.  Buy frozen fruits and vegetables in  resealable bags.  If you do not have access to quality fresh seafood, buy precooked frozen shrimp or canned fish, such as tuna, salmon, or sardines.  Buy small amounts of raw or cooked vegetables, salads, or olives from the deli or salad bar at your store.  Stock your pantry so you always have certain foods on hand, such as olive oil, canned tuna, canned tomatoes, rice, pasta, and beans. Cooking  Cook foods with extra-virgin olive oil instead of using butter or other vegetable oils.  Have meat as a side dish, and have vegetables or grains as your main dish. This means having meat in small portions or adding small amounts of meat to foods like pasta or stew.  Use beans or vegetables instead of meat in common dishes like chili or lasagna.  Experiment with different cooking methods. Try roasting or broiling vegetables instead of steaming or sauteing them.  Add frozen vegetables to soups, stews, pasta, or rice.  Add nuts or seeds for added healthy fat at each meal. You can add these to yogurt, salads, or vegetable dishes.  Marinate fish or vegetables using olive oil, lemon juice, garlic, and fresh herbs. Meal planning  Plan to eat 1 vegetarian meal one day each week. Try to work up to 2 vegetarian meals, if possible.  Eat seafood 2 or more times a week.  Have healthy snacks readily available, such as: ? Vegetable sticks with hummus. ?  Greek yogurt. ? Fruit and nut trail mix.  Eat balanced meals throughout the week. This includes: ? Fruit: 2-3 servings a day ? Vegetables: 4-5 servings a day ? Low-fat dairy: 2 servings a day ? Fish, poultry, or lean meat: 1 serving a day ? Beans and legumes: 2 or more servings a week ? Nuts and seeds: 1-2 servings a day ? Whole grains: 6-8 servings a day ? Extra-virgin olive oil: 3-4 servings a day  Limit red meat and sweets to only a few servings a month What are my food choices?  Mediterranean diet ? Recommended ? Grains: Whole-grain  pasta. Brown rice. Bulgar wheat. Polenta. Couscous. Whole-wheat bread. Orpah Cobb. ? Vegetables: Artichokes. Beets. Broccoli. Cabbage. Carrots. Eggplant. Green beans. Chard. Kale. Spinach. Onions. Leeks. Peas. Squash. Tomatoes. Peppers. Radishes. ? Fruits: Apples. Apricots. Avocado. Berries. Bananas. Cherries. Dates. Figs. Grapes. Lemons. Melon. Oranges. Peaches. Plums. Pomegranate. ? Meats and other protein foods: Beans. Almonds. Sunflower seeds. Pine nuts. Peanuts. Cod. Salmon. Scallops. Shrimp. Tuna. Tilapia. Clams. Oysters. Eggs. ? Dairy: Low-fat milk. Cheese. Greek yogurt. ? Beverages: Water. Red wine. Herbal tea. ? Fats and oils: Extra virgin olive oil. Avocado oil. Grape seed oil. ? Sweets and desserts: [place] yogurt with honey. Baked apples. Poached pears. Trail mix. ? Seasoning and other foods: Basil. Cilantro. Coriander. Cumin. Mint. Parsley. Sage. Rosemary. Tarragon. Garlic. Oregano. Thyme. Pepper. Balsalmic vinegar. Tahini. Hummus. Tomato sauce. Olives. Mushrooms. ? Limit these ? Grains: Prepackaged pasta or rice dishes. Prepackaged cereal with added sugar. ? Vegetables: Deep fried potatoes (french fries). ? Fruits: Fruit canned in syrup. ? Meats and other protein foods: Beef. Pork. Lamb. Poultry with skin. Hot dogs. Tomasa Blase. ? Dairy: Ice cream. Sour cream. Whole milk. ? Beverages: Juice. Sugar-sweetened soft drinks. Beer. Liquor and spirits. ? Fats and oils: Butter. Canola oil. Vegetable oil. Beef fat (tallow). Lard. ? Sweets and desserts: Cookies. Cakes. Pies. Candy. ? Seasoning and other foods: Mayonnaise. Premade sauces and marinades. ? The items listed may not be a complete list. Talk with your dietitian about what dietary choices are right for you. Summary  The Mediterranean diet includes both food and lifestyle choices.  Eat a variety of fresh fruits and vegetables, beans, nuts, seeds, and whole grains.  Limit the amount of red meat and sweets that you eat.  Talk with  your health care provider about whether it is safe for you to drink red wine in moderation. This means 1 glass a day for nonpregnant women and 2 glasses a day for men. A glass of wine equals 5 oz (150 mL). This information is not intended to replace advice given to you by your health care provider. Make sure you discuss any questions you have with your health care provider. Document Released: 11/09/2015 Document Revised: 12/12/2015 Document Reviewed: 11/09/2015 Elsevier Interactive Patient Education  [employer].

## 2017-07-22 NOTE — Progress Notes (Signed)
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   UPDATE: [name] was last seen by me for Botox in May 2018.  Botox was stopped because it was ineffective and it caused heaviness in her eyes.  Intensity:  7-8/10 Duration:  1 to 2 hours with ibuprofen or Tylenol Frequency:  4 migraines a month but has other tension-type headache 2 days a week. Current NSAIDS:  ASA 81mg , ibuprofen Current analgesics:  Tylenol  Current triptans:  contraindicated Current anti-emetic:  no Current muscle relaxants:  no Current anti-anxiolytic:  clonazepam 2mg  at bedtime Current sleep aide:  clonazepam 2mg  Current Antihypertensive medications:  no Current Antidepressant medications:  lexapro 20mg  (once in awhile), Vyvanse Current Anticonvulsant medications:  no Current Vitamins/Herbal/Supplements:  MVI Current Antihistamines/Decongestants:  no Other therapy:  no   Caffeine:  1 cup coffee daily, tea Alcohol:  no Smoker:  no Diet:  okay Exercise:  no Depression/anxiety:  She continues to have anxiety since stroke.   She continues to have memory deficits since the stroke.   HISTORY:  Onset:  She had an embolic right frontal ischemic stroke in March 2016, which was demonstrated on MRI of brain.  MRA showed occlusion of right M2 branch, felt to be embolic.  She was found to have a PFO with atrial septal aneurysm, which was subsequently closed.  EF was 55%.  DVT was not found.  She had been taking Plavix but now just ASA 81mg  daily.  She is not on a statin.  Headaches started soon after the stroke.   MRI of brain from 07/17/15, which was personally reviewed, again revealed remote right insular and parietal lobe infarcts but nothing acute.   Location:  Bi-frontal She has a constant headache but  fluctuates in intensity.  When she has a severe headache: Quality:  pounding Initial Intensity:  10/10 Aura:  Only once she saw a bright light Prodrome:  no Associated symptoms:  no Initial Duration:  3 to 4 hours Initial Frequency:  3 to 4 days per week Triggers/exacerbating factors:  Bending over Relieving factors:  Rubbing temples Activity:  Lays down   Past NSAIDS:  no Past analgesics:  Excedrin (side effects) Past abortive triptans:  contraindicated Past antihypertensive medications:  no Past antidepressant medications:  Bupropion, amitriptyline (side effects) Past anticonvulsant medications:  topiramate Past vitamins/Herbal/Supplements: no   Fears another stroke Sleep hygiene:  good   She used to work at a [employer].  She has been on disability since the stroke.  Since her stroke, she has had memory issues.  She forgets things quickly.  On rare occasions, she briefly is unsure where she is while driving but quickly re-orients herself.  She has trouble with names, even people she has known a while, such as her neighbor.  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  She feels it has been unchanged since the stroke.  Due to memory problems, she underwent neuropsychological testing on 02/15/16.  She demonstrated impairment in sustained attention, processing speed, visual-spatial skills, and executive functioning, which is consistent with mild vascular cognitive impairment.  She has history of ADHD and exhibits attention deficits on exam.  She is currently not treated for ADHD.  PAST MEDICAL HISTORY:  Past Medical History:  Diagnosis Date  . ADHD (attention deficit hyperactivity disorder)   . ALLERGIC RHINITIS   . Asthma   . Claustrophobia    unable to ride in elavator;leave room door open  . COPD (chronic obstructive pulmonary disease) (HCC)   . History of frequent urinary tract infections   . Migraine headache    since childhood  . PONV  (postoperative nausea and vomiting)    Pt states Zofran does not relieve sx; prefers Phenergan  . Post-menopause on HRT (hormone replacement therapy)   . Restless leg   . Stroke [facility](HCC)     MEDICATIONS: Current Outpatient Medications on File Prior to Visit  Medication Sig Dispense Refill  . acetaminophen (TYLENOL) 325 MG tablet Take 650 mg by mouth every 6 (six) hours as needed for headache.    . albuterol (PROVENTIL HFA) 108 (90 Base) MCG/ACT inhaler Inhale 2 puffs into the lungs every 4 (four) hours as needed for wheezing or shortness of breath. ProAir 18 g 12  . aspirin EC 81 MG tablet Take 81 mg by mouth daily.    . benzonatate (TESSALON) 200 MG capsule Take 1 capsule (200 mg total) by mouth 3 (three) times daily as needed for cough. 30 capsule 12  . clonazePAM (KLONOPIN) 2 MG tablet Take 2 mg by mouth at bedtime. For restless legs  0  . Glycopyrrolate-Formoterol (BEVESPI AEROSPHERE) 9-4.8 MCG/ACT AERO Inhale 2 puffs into the lungs 2 (two) times daily. 3 Inhaler 0  . montelukast (SINGULAIR) 10 MG tablet TAKE 1 TABLET (10 MG TOTAL) BY MOUTH DAILY. 90 tablet 3  . Multiple Vitamin (MULTIVITAMIN WITH MINERALS) TABS tablet Take 1 tablet by mouth daily.     No current facility-administered medications on file prior to visit.     ALLERGIES: Allergies  Allergen Reactions  . Penicillins Swelling  . Cefuroxime Swelling  . Excedrin Tension Headache [Acetaminophen-Caffeine] Swelling  . Hydrocodone Nausea Only  . Codeine Nausea Only    FAMILY HISTORY: Family History  Problem Relation Age of Onset  . Lung cancer Brother   . Asthma Mother   . Allergies Mother   . Heart disease Father        cause of death  . Breast cancer Cousin     SOCIAL HISTORY: Social History   Socioeconomic History  . Marital status: Married    Spouse name: Not on file  . Number of children: 3  . Years of education: Not on file  . Highest education level: Not on file  Occupational History  . Occupation:  Advertising account plannerHair Stylist    Employer: [employer]  Social Needs  . Financial resource strain: Not on file  . Food insecurity:    Worry: Not on file    Inability: Not on file  . Transportation needs:    Medical: Not on file    Non-medical: Not on file  Tobacco Use  . Smoking status: Former Smoker    Packs/day: 2.00    Years: 25.00    Pack years: 50.00    Types: Cigarettes    Last attempt to quit: 04/02/2003    Years since quitting: 14.3  . Smokeless tobacco: Never Used  Substance and Sexual Activity  . Alcohol use: No  . Drug use: No  . Sexual activity: Yes  Lifestyle  . Physical activity:    Days per week: Not on file    Minutes per session: Not on file  . Stress: Not on file  Relationships  . Social  connections:    Talks on phone: Not on file    Gets together: Not on file    Attends religious service: Not on file    Active member of club or organization: Not on file    Attends meetings of clubs or organizations: Not on file    Relationship status: Not on file  . Intimate partner violence:    Fear of current or ex partner: Not on file    Emotionally abused: Not on file    Physically abused: Not on file    Forced sexual activity: Not on file  Other Topics Concern  . Not on file  Social History Narrative  . Not on file    REVIEW OF SYSTEMS: Constitutional: No fevers, chills, or sweats, no generalized fatigue, change in appetite Eyes: No visual changes, double vision, eye pain Ear, nose and throat: No hearing loss, ear pain, nasal congestion, sore throat Cardiovascular: No chest pain, palpitations Respiratory:  No shortness of breath at rest or with exertion, wheezes GastrointestinaI: No nausea, vomiting, diarrhea, abdominal pain, fecal incontinence Genitourinary:  No dysuria, urinary retention or frequency Musculoskeletal:  No neck pain, back pain Integumentary: No rash, pruritus, skin lesions Neurological: as above Psychiatric: anxiety Endocrine: No palpitations,  fatigue, diaphoresis, mood swings, change in appetite, change in weight, increased thirst Hematologic/Lymphatic:  No purpura, petechiae. Allergic/Immunologic: no itchy/runny eyes, nasal congestion, recent allergic reactions, rashes  PHYSICAL EXAM: Vitals:   07/22/17 0858  BP: (!) 146/86  Pulse: 85  SpO2: 96%   General: No acute distress.  Patient appears well-groomed.   Head:  Normocephalic/atraumatic Eyes:  Fundi examined but not visualized Neck: supple, no paraspinal tenderness, full range of motion Heart:  Regular rate and rhythm Lungs:  Clear to auscultation bilaterally Back: No paraspinal tenderness Neurological Exam: alert and oriented to person, place, and time. Attention span and concentration intact, recent and remote memory intact, fund of knowledge intact.  Speech fluent and not dysarthric, language intact.  CN II-XII intact. Bulk and tone normal, muscle strength 5/5 throughout.  Sensation to light touch  intact.  Deep tendon reflexes 2+ throughout.  Finger to nose testing intact.  Gait normal, Romberg negative.  IMPRESSION: Migraine without aura Tension-type headache Vascular cognitive impairment Anxiety Embolic right MCA territory stroke status post PFO closure Elevated blood pressure  PLAN: 1.  Due to increased anxiety and to help achieve improved migraine control, she will completely discontinue Lexapro and start venlafaxine XR 75mg  daily.  Small risk of serotonin syndrome with Vyvanse was discussed (information provided). 2.  Ibuprofen or Tylenol for abortive therapy, limit to no more than 2 days out of week to prevent rebound headache. 3.  Headache diary 4.  Continue ASA 81mg  daily for secondary stroke prevention. 5.  Advised to follow up with PCP regarding blood pressure.  Shon Millet, DO  CC:  Kaleen Mask, MD

## 2017-07-29 ENCOUNTER — Encounter: Payer: Self-pay | Admitting: Neurology

## 2017-08-26 ENCOUNTER — Encounter

## 2017-08-26 ENCOUNTER — Ambulatory Visit: Payer: BLUE CROSS/BLUE SHIELD | Admitting: Neurology

## 2017-09-04 ENCOUNTER — Ambulatory Visit (INDEPENDENT_AMBULATORY_CARE_PROVIDER_SITE_OTHER): Payer: BLUE CROSS/BLUE SHIELD | Admitting: Internal Medicine

## 2017-09-04 ENCOUNTER — Encounter: Payer: Self-pay | Admitting: Internal Medicine

## 2017-09-04 VITALS — BP 130/80 | HR 82 | Ht 66.0 in | Wt 182.4 lb

## 2017-09-04 DIAGNOSIS — J449 Chronic obstructive pulmonary disease, unspecified: Secondary | ICD-10-CM | POA: Diagnosis not present

## 2017-09-04 DIAGNOSIS — J209 Acute bronchitis, unspecified: Secondary | ICD-10-CM

## 2017-09-04 DIAGNOSIS — J0100 Acute maxillary sinusitis, unspecified: Secondary | ICD-10-CM

## 2017-09-04 MED ORDER — METHYLPREDNISOLONE ACETATE 80 MG/ML IJ SUSP
80.0000 mg | Freq: Once | INTRAMUSCULAR | Status: AC
  Administered 2017-09-04: 80 mg via INTRAMUSCULAR

## 2017-09-04 MED ORDER — PHENYLEPHRINE HCL 1 % NA SOLN
3.0000 [drp] | Freq: Once | NASAL | Status: AC
  Administered 2017-09-04: 3 [drp] via NASAL

## 2017-09-04 NOTE — Patient Instructions (Signed)
Script sent for Zpak  Order- depo 80   Dx acute bronchitis              Neb neo nasal    Dx acute maxillary sinusitis  Stay well hydrated  Please call if we can help

## 2017-09-04 NOTE — Progress Notes (Signed)
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  FVC 1.62/45%, FEV1 1.12/40%, ratio 0.69, FEF 25-75% 0.69/27% ------------------------------------------------------------------------- 07/10/17-61 yo female former smoker followed for allergic rhinitis, asthma/COPD, complicated by embolic stroke RMCA/ ASD closed 2016 Albuterol HFA, Bevespi, Singulair Seen for acute visit in December with bronchitis.  Treated with Depo-Medrol, prednisone taper, Tessalon Perles, Mucinex Nasal congestion, postnasal drip with no fever.  Cough.  Antihistamine helps some.  No fever.  Asking for CXR. Office Spirometry 07/10/17-severe obstructive airways disease with restriction of exhaled volume.  FVC 1.62/45%, FEV1 1.12/40%, ratio 0.69, FEF 25-75% 0.69/27% CXR 05/09/16 No active cardiopulmonary disease.  09/04/2017- 62 yo female former smoker followed for allergic rhinitis, asthma/COPD, complicated by embolic stroke RMCA/ ASD closed 2016 Albuterol HFA, Bevespi, Singulair ----Cough-productive last night-lt yellow in color, chest congestion, hoarseness, and sinus pressure-clear drainage; headache,body aches. Denies any fever or chills.  Acute onset of cough, sinus congestion with frontal headache, sore throat, cough productive scant white/yellow but no fever or arthralgias.  Anticipates this will get progressively worse into the weekend.                  ROS-see HPI   + = positive Constitutional:   No-   weight loss, night sweats, fevers, chills, fatigue, lassitude. HEENT:   No-  headaches, difficulty swallowing, tooth/dental problems, sore throat,       No-  sneezing, itching, ear ache,  +nasal congestion, post nasal drip,  CV:  No-   chest pain,  orthopnea, PND, swelling in lower extremities, anasarca, dizziness, palpitations Resp:   shortness of breath with exertion or at rest.                 +productive cough,  No non-productive cough,  No- coughing up of blood.              + change in color of mucus.  + wheezing.   Skin: No-   rash or lesions. GI:  No-   heartburn, indigestion, abdominal pain, nausea, vomiting, GU:  MS:  No-   joint pain or swelling.   Neuro-     nothing unusual Psych:  No- change in mood or affect. No depression or anxiety.  No memory loss.  OBJ- Physical Exam   General- Alert, Oriented, Affect-appropriate, Distress- none acute Skin- rash-none, lesions- none, excoriation- none Lymphadenopathy- none Head- atraumatic            Eyes- Gross vision intact, PERRLA, conjunctivae and secretions clear            Ears- Hearing, canals-normal            Nose- + turbinate edema, no-Septal dev, mucus, polyps, erosion, perforation             Throat- Mallampati II , mucosa cobblestoned , drainage- none, tonsils- atrophic, + hoarse Neck- flexible , trachea midline, no stridor , thyroid nl, carotid no bruit Chest - symmetrical excursion , unlabored           Heart/CV- RRR , no murmur , no gallop  , no rub, nl s1 s2                           - JVD- none , edema- none, stasis changes- none, varices-  none           Lung- + diminished but clear, wheeze- none, cough- none , dullness-none, rub- none           Chest wall-  Abd-  Br/ Gen/ Rectal- Not done, not indicated Extrem- cyanosis- none, clubbing, none, atrophy- none, strength- nl Neuro- grossly intact to observation

## 2017-09-05 NOTE — Assessment & Plan Note (Signed)
Acute exacerbation beginning as a URI with potential for bacterial progression.  We will target the upper airway component first. Plan-Depo-Medrol, nasal nebulizer decongestant treatment, Z-Pak, stay well-hydrated, decongestants and mucolytic's as needed.

## 2017-09-08 ENCOUNTER — Other Ambulatory Visit: Payer: Self-pay | Admitting: Neurology

## 2017-09-09 ENCOUNTER — Telehealth: Payer: Self-pay | Admitting: Internal Medicine

## 2017-09-09 MED ORDER — AZITHROMYCIN 250 MG PO TABS: ORAL_TABLET | ORAL | 0 refills | Status: DC

## 2017-09-09 NOTE — Telephone Encounter (Signed)
Called and spoke to pt. Pt states she was suppose to get a zpak at the last OV with CY on 6.6.2019. Per pt's chart this was not sent to pharmacy. Sent rx to preferred pharmacy. Pt verbalized understanding and is also aware to call our office back or seek emergency care if her s/s do not improve or worsen.

## 2017-09-09 NOTE — Telephone Encounter (Signed)
Pt said that CY was supposed to proscribe Z-pk for her.

## 2017-09-16 ENCOUNTER — Other Ambulatory Visit: Payer: Self-pay | Admitting: Acute Care

## 2017-09-16 DIAGNOSIS — Z122 Encounter for screening for malignant neoplasm of respiratory organs: Secondary | ICD-10-CM

## 2017-09-16 DIAGNOSIS — Z87891 Personal history of nicotine dependence: Secondary | ICD-10-CM

## 2017-09-16 NOTE — Progress Notes (Signed)
Hst

## 2017-09-24 ENCOUNTER — Encounter: Payer: BLUE CROSS/BLUE SHIELD | Admitting: Acute Care

## 2017-10-08 ENCOUNTER — Encounter: Payer: Self-pay | Admitting: Acute Care

## 2017-10-08 ENCOUNTER — Ambulatory Visit (INDEPENDENT_AMBULATORY_CARE_PROVIDER_SITE_OTHER): Payer: BLUE CROSS/BLUE SHIELD | Admitting: Acute Care

## 2017-10-08 ENCOUNTER — Ambulatory Visit
Admission: RE | Admit: 2017-10-08 | Discharge: 2017-10-08 | Disposition: A | Discharge status: Home / Self Care | Payer: BLUE CROSS/BLUE SHIELD | Source: Ambulatory Visit | Attending: Acute Care | Admitting: Acute Care

## 2017-10-08 DIAGNOSIS — Z122 Encounter for screening for malignant neoplasm of respiratory organs: Secondary | ICD-10-CM

## 2017-10-08 DIAGNOSIS — Z87891 Personal history of nicotine dependence: Secondary | ICD-10-CM

## 2017-10-08 NOTE — Progress Notes (Signed)
Shared Decision Making Visit Lung Cancer Screening Program [phone]([id number])   Eligibility:  Age 62 y.o.  Pack Years Smoking History Calculation 31 pack year smoking history (# packs/per year x # years smoked)  Recent History of coughing up blood  no  Unexplained weight loss? no ( >Than 15 pounds within the last 6 months )  Prior History Lung / other cancer no (Diagnosis within the last 5 years already requiring surveillance chest CT Scans).  Smoking Status Former Smoker  Former Smokers: Years since quit: 14 years  Quit Date: 2005  Visit Components:  Discussion included one or more decision making aids. yes  Discussion included risk/benefits of screening. yes  Discussion included potential follow up diagnostic testing for abnormal scans. yes  Discussion included meaning and risk of over diagnosis. yes  Discussion included meaning and risk of False Positives. yes  Discussion included meaning of total radiation exposure. yes  Counseling Included:  Importance of adherence to annual lung cancer LDCT screening. yes  Impact of comorbidities on ability to participate in the program. yes  Ability and willingness to under diagnostic treatment. yes  Smoking Cessation Counseling:  Current Smokers:   Discussed importance of smoking cessation. NA  Information about tobacco cessation classes and interventions provided to patient. yes  Patient provided with "ticket" for LDCT Scan. yes  Symptomatic Patient. no  Counseling  Diagnosis Code: Tobacco Use Z72.0  Asymptomatic Patient yes  Counseling (Intermediate counseling: > three minutes counseling) [id number]  Former Smokers:   Discussed the importance of maintaining cigarette abstinence. yes  Diagnosis Code: Personal History of Nicotine Dependence. W29.562Z87.891  Information about tobacco cessation classes and interventions provided to patient. Yes  Patient provided with "ticket" for LDCT Scan. yes  Written Order for Lung Cancer  Screening with LDCT placed in Epic. Yes (CT Chest Lung Cancer Screening Low Dose W/O CM) [id number] Z12.2-Screening of respiratory organs Z87.891-Personal history of nicotine dependence  I spent 25 minutes of face to face time with Ms. Estanislado SpireLakey discussing the risks and benefits of lung cancer screening. We viewed a power point together that explained in detail the above noted topics. We took the time to pause the power point at intervals to allow for questions to be asked and answered to ensure understanding. We discussed that she had taken the single most powerful action possible to decrease her risk of developing lung cancer when she quit smoking. I counseled her to remain smoke free, and to contact me if she ever had the desire to smoke again so that I can provide resources and tools to help support the effort to remain smoke free. We discussed the time and location of the scan, and that either  Abigail Miyamotoenise Phelps RN or I will call with the results within  24-48 hours of receiving them. She has my card and contact information in the event she needs to speak with me, in addition to a copy of the power point we reviewed as a resource. She verbalized understanding of all of the above and had no further questions upon leaving the office.     I explained to the patient that there has been a high incidence of coronary artery disease noted on these exams. I explained that this is a non-gated exam therefore degree or severity cannot be determined. This patient is not on statin therapy. I have asked the patient to follow-up with their PCP regarding any incidental finding of coronary artery disease and management with diet or medication as they feel is clinically  indicated. The patient verbalized understanding of the above and had no further questions.     Bevelyn Ngo, NP  10/08/2017 10:07 AM

## 2017-10-14 ENCOUNTER — Other Ambulatory Visit: Payer: Self-pay | Admitting: Acute Care

## 2017-10-14 ENCOUNTER — Telehealth: Payer: Self-pay | Admitting: Acute Care

## 2017-10-14 DIAGNOSIS — Z87891 Personal history of nicotine dependence: Secondary | ICD-10-CM

## 2017-10-14 DIAGNOSIS — Z122 Encounter for screening for malignant neoplasm of respiratory organs: Secondary | ICD-10-CM

## 2017-10-14 NOTE — Telephone Encounter (Signed)
Pt informed of CT results per Sarah Groce, NP.  PT verbalized understanding.  Copy sent to PCP.  Order placed for 1 yr f/u CT.  

## 2017-11-06 ENCOUNTER — Ambulatory Visit (INDEPENDENT_AMBULATORY_CARE_PROVIDER_SITE_OTHER): Payer: BLUE CROSS/BLUE SHIELD | Admitting: Neurology

## 2017-11-06 ENCOUNTER — Encounter: Payer: Self-pay | Admitting: Neurology

## 2017-11-06 VITALS — BP 120/82 | HR 76 | Ht 66.0 in | Wt 173.0 lb

## 2017-11-06 DIAGNOSIS — I63411 Cerebral infarction due to embolism of right middle cerebral artery: Secondary | ICD-10-CM

## 2017-11-06 DIAGNOSIS — G3184 Mild cognitive impairment, so stated: Secondary | ICD-10-CM

## 2017-11-06 DIAGNOSIS — G43009 Migraine without aura, not intractable, without status migrainosus: Secondary | ICD-10-CM

## 2017-11-06 DIAGNOSIS — R55 Syncope and collapse: Secondary | ICD-10-CM | POA: Diagnosis not present

## 2017-11-06 NOTE — Patient Instructions (Signed)
1.  Continue venlafaxine XR 75mg  daily 2.  Limit use of Tylenol or ibuprofen to no more than 2 days out of week to prevent rebound headache 3.  Keep headache diary 4.  Continue aspirin 81mg  daily for secondary stroke prevention.  Recommend talking with your PCP about restarting a cholesterol medication 5.  Follow up in 6 months.

## 2017-11-06 NOTE — Progress Notes (Signed)
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   UPDATE: Headaches are well controlled. Intensity:  7-8/10 Duration:  1 to 2 hours with ibuprofen or Tylenol Frequency:  2 to 4 days a month Current NSAIDS:  ASA 81mg , ibuprofen Current analgesics:  Tylenol  Current triptans:  contraindicated Current anti-emetic:  no Current muscle relaxants:  no Current anti-anxiolytic:  clonazepam 2mg  at bedtime Current sleep aide:  clonazepam 2mg  Current Antihypertensive medications:  no Current Antidepressant medications:  Venlafaxine XR 75mg , Current Anticonvulsant medications:  no Current Vitamins/Herbal/Supplements:  MVI Current Antihistamines/Decongestants:  No  On occasion, she reports dizziness described as sensation that she is going to pass out" with blackening of vision.  It occurs on occasion if she is on her knees doing something.      Caffeine:  1 cup coffee daily, tea Alcohol:  no Smoker:  no Diet:  okay Exercise:  no Depression/anxiety:  She continues to have anxiety since stroke.   She continues to have memory deficits since the stroke.   HISTORY:  Onset:  She had an embolic right frontal ischemic stroke in March 2016, which was demonstrated on MRI of brain.  MRA showed occlusion of right M2 branch, felt to be embolic.  She was found to have a PFO with atrial septal aneurysm, which was subsequently closed.  EF was 55%.  DVT was not found.  She had been taking Plavix but now just ASA 81mg  daily.  She is not on a statin.  Headaches started soon after the stroke.   MRI of brain from 07/17/15, which was personally reviewed, again revealed remote right insular and parietal lobe infarcts but nothing acute.   Location:  Bi-frontal She has a constant  headache but fluctuates in intensity.  When she has a severe headache: Quality:  pounding Initial Intensity:  10/10 Aura:  Only once she saw a bright light Prodrome:  no Associated symptoms:  no Initial Duration:  3 to 4 hours Initial Frequency:  3 to 4 days per week Triggers/exacerbating factors:  Bending over Relieving factors:  Rubbing temples Activity:  Lays down   Past NSAIDS:  no Past analgesics:  Excedrin (side effects) Past abortive triptans:  contraindicated Past antihypertensive medications:  no Past antidepressant medications:  Bupropion, amitriptyline (side effects), Lexapro 20mg  Past anticonvulsant medications:  topiramate Other past therapy:  Botox (ineffective, made eyes heavy) Past vitamins/Herbal/Supplements: no   Fears another stroke Sleep hygiene:  good   She used to work at a [employer].  She has been on disability since the stroke.  Since her stroke, she has had memory issues.  She forgets things quickly.  On rare occasions, she briefly is unsure where she is while driving but quickly re-orients herself.  She has trouble with names, even people she has known a while, such as her neighbor.  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  She feels it has been unchanged since the stroke.  Due to memory problems, she underwent neuropsychological testing on 02/15/16.  She demonstrated impairment in sustained attention, processing speed, visual-spatial skills, and executive functioning, which is consistent with mild vascular cognitive impairment.  She has history of ADHD and exhibits attention deficits on exam.  She is currently not treated for ADHD.  PAST MEDICAL HISTORY: Past Medical History:  Diagnosis Date  . ADHD (attention deficit hyperactivity disorder)   . ALLERGIC RHINITIS   . Asthma   . Claustrophobia    unable to ride in elavator;leave room door open  . COPD (chronic obstructive pulmonary disease) (HCC)   . History of  frequent urinary tract infections   . Migraine headache    since childhood  . PONV (postoperative nausea and vomiting)    Pt states Zofran does not relieve sx; prefers Phenergan  . Post-menopause on HRT (hormone replacement therapy)   . Restless leg   . Stroke [facility])     MEDICATIONS: Current Outpatient Medications on File Prior to Visit  Medication Sig Dispense Refill  . acetaminophen (TYLENOL) 325 MG tablet Take 650 mg by mouth every 6 (six) hours as needed for headache.    . albuterol (PROVENTIL HFA) 108 (90 Base) MCG/ACT inhaler Inhale 2 puffs into the lungs every 4 (four) hours as needed for wheezing or shortness of breath. ProAir 18 g 12  . aspirin EC 81 MG tablet Take 81 mg by mouth daily.    [name] azithromycin (ZITHROMAX) 250 MG tablet Take as directed 6 tablet 0  . benzonatate (TESSALON) 200 MG capsule Take 1 capsule (200 mg total) by mouth 3 (three) times daily as needed for cough. 30 capsule 12  . clonazePAM (KLONOPIN) 2 MG tablet Take 2 mg by mouth at bedtime. For restless legs  0  . Glycopyrrolate-Formoterol (BEVESPI AEROSPHERE) 9-4.8 MCG/ACT AERO Inhale 2 puffs into the lungs 2 (two) times daily. 3 Inhaler 0  . montelukast (SINGULAIR) 10 MG tablet TAKE 1 TABLET (10 MG TOTAL) BY MOUTH DAILY. 90 tablet 3  . Multiple Vitamin (MULTIVITAMIN WITH MINERALS) TABS tablet Take 1 tablet by mouth daily.    [name] venlafaxine XR (EFFEXOR-XR) 75 MG 24 hr capsule TAKE 1 CAPSULE (75 MG TOTAL) BY MOUTH DAILY WITH BREAKFAST. 90 capsule 2   No current facility-administered medications on file prior to visit.     ALLERGIES: Allergies  Allergen Reactions  . Penicillins Swelling  . Cefuroxime Swelling  . Excedrin Tension Headache [Acetaminophen-Caffeine] Swelling  . Hydrocodone Nausea Only  . Codeine Nausea Only    FAMILY HISTORY: Family History  Problem Relation Age of Onset  . Lung cancer Brother   . Asthma Mother   . Allergies Mother   . Heart disease Father        cause of death  .  Breast cancer Cousin     SOCIAL HISTORY: Social History   Socioeconomic History  . Marital status: Married    Spouse name: Not on file  . Number of children: 3  . Years of education: Not on file  . Highest education level: Not on file  Occupational History  . Occupation: Advertising account planner: [employer]  Social Needs  . Financial resource strain: Not on file  . Food insecurity:    Worry: Not on file    Inability: Not on file  . Transportation needs:    Medical: Not on file    Non-medical: Not on file  Tobacco Use  . Smoking status: Former Smoker    Packs/day: 2.00    Years: 25.00    Pack years: 50.00    Types: Cigarettes    Last attempt to quit: 04/02/2003    Years since quitting: 14.6  . Smokeless tobacco: Never Used  Substance and Sexual Activity  .  Alcohol use: No  . Drug use: No  . Sexual activity: Yes  Lifestyle  . Physical activity:    Days per week: Not on file    Minutes per session: Not on file  . Stress: Not on file  Relationships  . Social connections:    Talks on phone: Not on file    Gets together: Not on file    Attends religious service: Not on file    Active member of club or organization: Not on file    Attends meetings of clubs or organizations: Not on file    Relationship status: Not on file  . Intimate partner violence:    Fear of current or ex partner: Not on file    Emotionally abused: Not on file    Physically abused: Not on file    Forced sexual activity: Not on file  Other Topics Concern  . Not on file  Social History Narrative  . Not on file    REVIEW OF SYSTEMS: Constitutional: No fevers, chills, or sweats, no generalized fatigue, change in appetite Eyes: No visual changes, double vision, eye pain Ear, nose and throat: No hearing loss, ear pain, nasal congestion, sore throat Cardiovascular: No chest pain, palpitations Respiratory:  No shortness of breath at rest or with exertion, wheezes GastrointestinaI: No nausea,  vomiting, diarrhea, abdominal pain, fecal incontinence Genitourinary:  No dysuria, urinary retention or frequency Musculoskeletal:  No neck pain, back pain Integumentary: No rash, pruritus, skin lesions Neurological: as above Psychiatric: No depression, insomnia, anxiety Endocrine: No palpitations, fatigue, diaphoresis, mood swings, change in appetite, change in weight, increased thirst Hematologic/Lymphatic:  No purpura, petechiae. Allergic/Immunologic: no itchy/runny eyes, nasal congestion, recent allergic reactions, rashes  PHYSICAL EXAM: Vitals:   11/06/17 0903  BP: 120/82  Pulse: 76  SpO2: 97%   General: No acute distress.  Patient appears well-groomed.   Head:  Normocephalic/atraumatic Eyes:  Fundi examined but not visualized Neck: supple, no paraspinal tenderness, full range of motion Heart:  Regular rate and rhythm Lungs:  Clear to auscultation bilaterally Back: No paraspinal tenderness Neurological Exam: alert and oriented to person, place, and time. Attention span and concentration intact, recent and remote memory intact, fund of knowledge intact.  Speech fluent and not dysarthric, language intact.  CN II-XII intact. Bulk and tone normal, muscle strength 5/5 throughout.  Sensation to light touch  intact.  Deep tendon reflexes 2+ throughout.  Finger to nose testing intact.  Gait normal, Romberg negative.  IMPRESSION: Migraine without aura, without status migrainosus, not intractable Tension-type headache, not intractable Mild vascular cognitive impairment Embolic right MCA territory stroke s/p PFO closure Near-syncope anxiety  PLAN: 1.  Continue venlafaxine XR 75mg  daily for migraine prevention 2.  Limit use of Tylenol or ibuprofen to no more than 2 days out of week to prevent rebound headache 3.  Keep headache diary 4.  Continue aspirin 81mg  daily for secondary stroke prevention.  She stopped statin because she didn't want to take it.  Recommended discussing with PCP  about restarting statin therapy for secondary stroke prevention (LDL goal less than 70) 5.  Advised to discuss near-syncope with PCP. 6.  Follow up in 6 months.  Shon MilletAdam Maudene Stotler, DO  CC:  Dr. Jeannetta NapElkins

## 2017-11-13 ENCOUNTER — Ambulatory Visit: Payer: BLUE CROSS/BLUE SHIELD | Admitting: Internal Medicine

## 2017-11-18 ENCOUNTER — Ambulatory Visit: Payer: BLUE CROSS/BLUE SHIELD | Admitting: Internal Medicine

## 2017-11-26 ENCOUNTER — Other Ambulatory Visit: Payer: Self-pay | Admitting: Pulmonary Disease

## 2017-12-07 ENCOUNTER — Other Ambulatory Visit: Payer: Self-pay | Admitting: Internal Medicine

## 2017-12-08 ENCOUNTER — Ambulatory Visit (INDEPENDENT_AMBULATORY_CARE_PROVIDER_SITE_OTHER): Payer: BLUE CROSS/BLUE SHIELD | Admitting: Internal Medicine

## 2017-12-08 ENCOUNTER — Encounter: Payer: Self-pay | Admitting: Internal Medicine

## 2017-12-08 DIAGNOSIS — J0100 Acute maxillary sinusitis, unspecified: Secondary | ICD-10-CM

## 2017-12-08 DIAGNOSIS — J449 Chronic obstructive pulmonary disease, unspecified: Secondary | ICD-10-CM | POA: Diagnosis not present

## 2017-12-08 HISTORY — DX: Acute maxillary sinusitis, unspecified: J01.00

## 2017-12-08 MED ORDER — DOXYCYCLINE HYCLATE 100 MG PO TABS: 100.0000 mg | ORAL_TABLET | Freq: Two times a day (BID) | ORAL | 0 refills | Status: DC

## 2017-12-08 MED ORDER — PHENYLEPHRINE HCL 1 % NA SOLN
3.0000 [drp] | Freq: Once | NASAL | Status: AC
  Administered 2017-12-08: 3 [drp] via NASAL

## 2017-12-08 MED ORDER — METHYLPREDNISOLONE ACETATE 80 MG/ML IJ SUSP
80.0000 mg | Freq: Once | INTRAMUSCULAR | Status: AC
Start: 2017-12-08 — End: 2017-12-08
  Administered 2017-12-08: 80 mg via INTRAMUSCULAR

## 2017-12-08 MED ORDER — BENZONATATE 200 MG PO CAPS: 200.0000 mg | ORAL_CAPSULE | Freq: Three times a day (TID) | ORAL | 12 refills | Status: DC | PRN

## 2017-12-08 NOTE — Progress Notes (Signed)
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  FVC 1.62/45%, FEV1 1.12/40%, ratio 0.69, FEF 25-75% 0.69/27% ------------------------------------------------------------------------- 09/04/2017- 62 yo female former smoker followed for allergic rhinitis, asthma/COPD, complicated by embolic stroke RMCA/ ASD closed 2016 Albuterol HFA, Bevespi, Singulair ----Cough-productive last night-lt yellow in color, chest congestion, hoarseness, and sinus pressure-clear drainage; headache,body aches. Denies any fever or chills.  Acute onset of cough, sinus congestion with frontal headache, sore throat, cough productive scant white/yellow but no fever or arthralgias.  Anticipates this will get progressively worse into the weekend.  12/08/2017- 62 yo female former smoker followed for allergic rhinitis, asthma/COPD, complicated by embolic stroke RMCA/ ASD closed 2016 Enrolled in Lung Cancer Screening CT gram by Kandice Robinsons. -----Acute visit- On 12/05/17 started with nasal congestion, cough productive mucus is yellow to green. Denies any fever. Bevespi, Singulair, albuterol HFA, Tessalon Child brought respiratory infection home from school.  Started with nasal congestion, headache, drainage.  Now moved into chest with productive cough, green. We discussed the results of her initial CT screening for lung nodules. CT lung screening 10/08/2017-  1.6 mm RUL nodule. Bening appearance tracking.                 ROS-see HPI   + = positive Constitutional:   No-   weight loss, night sweats, fevers, chills, fatigue, lassitude. HEENT:   + headaches, difficulty swallowing, tooth/dental problems, sore throat,       No-  sneezing, itching, ear ache,  +nasal  congestion, post nasal drip,  CV:  No-   chest pain, orthopnea, PND, swelling in lower extremities, anasarca, dizziness, palpitations Resp:   shortness of breath with exertion or at rest.                 +productive cough,  No non-productive cough,  No- coughing up of blood.              + change in color of mucus.   wheezing.   Skin: No-   rash or lesions. GI:  No-   heartburn, indigestion, abdominal pain, nausea, vomiting, GU:  MS:  No-   joint pain or swelling.   Neuro-     nothing unusual Psych:  No- change in mood or affect. No depression or anxiety.  No memory loss.  OBJ- Physical Exam   General- Alert, Oriented, Affect-appropriate, Distress- none acute Skin- rash-none, lesions- none, excoriation- none Lymphadenopathy- none Head- atraumatic            Eyes- Gross vision intact, PERRLA, conjunctivae and secretions clear            Ears- Hearing, canals-normal            Nose- + turbinate edema, no-Septal dev, mucus, polyps, erosion, perforation             Throat- Mallampati II , mucosa cobblestoned , drainage- none, tonsils- atrophic,                                + hoarse Neck- flexible , trachea midline, no stridor , thyroid nl, carotid no bruit Chest - symmetrical excursion , unlabored           Heart/CV- RRR , no murmur , no gallop  ,  no rub, nl s1 s2                           - JVD- none , edema- none, stasis changes- none, varices- none           Lung- + diminished / coarse, wheeze- none, cough- none , dullness-none, rub- none           Chest wall-  Abd-  Br/ Gen/ Rectal- Not done, not indicated Extrem- cyanosis- none, clubbing, none, atrophy- none, strength- nl Neuro- grossly intact to observation

## 2017-12-08 NOTE — Assessment & Plan Note (Signed)
Acute exacerbation began with a URI.  We will treat this now as sinusitis and bronchitis.  Defer flu shot until she is feeling better I reminded her not to miss it. Plan-doxycycline, refill benzonatate, stay well-hydrated.

## 2017-12-08 NOTE — Assessment & Plan Note (Signed)
Began as an upper respiratory infection. Plan-nasal nebulizer decongestant, Depo-Medrol, doxycycline, hydration

## 2017-12-08 NOTE — Patient Instructions (Signed)
Scripts sent for doxycycline and benzonatate perles  Order- nasal neb neo    Dx acute maxillary sinusitis               Depo 80  Stay well hydrated  Get your flu shot in a week or 2, when you are feeling better

## 2017-12-15 ENCOUNTER — Other Ambulatory Visit (INDEPENDENT_AMBULATORY_CARE_PROVIDER_SITE_OTHER): Payer: BLUE CROSS/BLUE SHIELD

## 2017-12-15 ENCOUNTER — Telehealth: Payer: Self-pay | Admitting: Internal Medicine

## 2017-12-15 ENCOUNTER — Other Ambulatory Visit: Payer: Self-pay | Admitting: Pulmonary Disease

## 2017-12-15 ENCOUNTER — Ambulatory Visit (INDEPENDENT_AMBULATORY_CARE_PROVIDER_SITE_OTHER): Payer: BLUE CROSS/BLUE SHIELD | Admitting: Pulmonary Disease

## 2017-12-15 ENCOUNTER — Telehealth: Payer: Self-pay | Admitting: Pulmonary Disease

## 2017-12-15 ENCOUNTER — Encounter: Payer: Self-pay | Admitting: Pulmonary Disease

## 2017-12-15 VITALS — BP 124/68 | HR 80 | Temp 97.7°F | Ht 66.5 in | Wt 175.4 lb

## 2017-12-15 DIAGNOSIS — J449 Chronic obstructive pulmonary disease, unspecified: Secondary | ICD-10-CM | POA: Diagnosis not present

## 2017-12-15 DIAGNOSIS — J0100 Acute maxillary sinusitis, unspecified: Secondary | ICD-10-CM

## 2017-12-15 LAB — BASIC METABOLIC PANEL
BUN: 22 mg/dL (ref 6–23)
CHLORIDE: 105 meq/L (ref 96–112)
CO2: 33 mEq/L — ABNORMAL HIGH (ref 19–32)
Calcium: 9 mg/dL (ref 8.4–10.5)
Creatinine, Ser: 0.88 mg/dL (ref 0.40–1.20)
GFR: 69.69 mL/min (ref >60.00)
Glucose, Bld: 122 mg/dL — ABNORMAL HIGH (ref 70–99)
Potassium: 3.8 mEq/L (ref 3.5–5.1)
Sodium: 142 mEq/L (ref 135–145)

## 2017-12-15 MED ORDER — LEVOFLOXACIN 500 MG PO TABS: 500.0000 mg | ORAL_TABLET | Freq: Every day | ORAL | 0 refills | Status: DC

## 2017-12-15 MED ORDER — PREDNISONE 10 MG PO TABS: ORAL_TABLET | ORAL | 0 refills | Status: DC

## 2017-12-15 NOTE — Patient Instructions (Addendum)
Lab work today >>>Bmet  We will call you once we receive the results of your bmet to start an antibiotic  Prednisone 10mg  tablet  >>>4 tabs for 2 days, then 3 tabs for 2 days, 2 tabs for 2 days, then 1 tab for 2 days, then stop >>>take with food  >>>take in the morning   Please start probiotic daily, continue this for at least the next 90 days as you have had multiple antibiotics in the past month  It is flu season:   >>>Remember to be washing your hands regularly, using hand sanitizer, be careful to use around herself with has contact with people who are sick will increase her chances of getting sick yourself. >>> Best ways to protect herself from the flu: Receive the yearly flu vaccine, practice good hand hygiene washing with soap and also using hand sanitizer when available, eat a nutritious meals, get adequate rest, hydrate appropriately   Please contact the office if your symptoms worsen or you have concerns that you are not improving.   Thank you for choosing [facility] for your healthcare, and for allowing us to partner with you on your healthcare journey. I am thankful to be able to provide care to you today.   Elisha HeadlandBrian Keyleigh Manninen FNP-C

## 2017-12-15 NOTE — Progress Notes (Signed)
Was able to talk to patient regarding patient's results.  They verbalized an understanding of what was discussed.   Order placed for LEvaquin and sent to pharmacy of patient's choice  No further questions at this time.

## 2017-12-15 NOTE — Progress Notes (Signed)
@Patient  ID: [name], female    DOB: 10-23-1955, 62 y.o.   MRN: [id number]  Chief Complaint  Patient presents with  . 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 Maple Hudson   12/15/2017  - Visit   62 year old female patient presenting today for acute visit.  Patient reports that she was recently treated at the beginning this month by Dr. Maple Hudson with doxycycline as well as a Depo-Medrol injection.  Patient reports that her symptoms initially were improving but have gotten worse after completing treatment.  Patient reports that she is still having productive yellow thick mucus as well as yellow thick nasal drainage.  Patient reports she is having increased sinus pain and sinus headaches.  Patient has had a migraine this morning.  Patient reports adherence to [facility].   Tests:   06/17/2014-CT head with and without contrast- consistent with subacute right MCA territory infarction, sinuses and mastoids are clear  PFT 11/02/13- mild obstructive airways disease with insignificant response to bronchodilator Office Spirometry 07/10/17-severe obstructive airways disease with restriction of exhaled volume.  FVC 1.62/45%, FEV1 1.12/40%, ratio 0.69, FEF 25-75% 0.69/27% CT lung screening 10/08/2017-  1.6 mm RUL nodule. Bening appearance tracking.   10/08/2017-CT chest lung cancer screening- 1.6 mm right upper lobe nodule, lung RADS 2, follow-up CT without contrast in 12 months  Chart Review:     Specialty Problems      Pulmonary Problems   Seasonal and perennial allergic rhinitis    Qualifier: Diagnosis of  By: Roxan Hockey CMA, Shanda Bumps        URI    Qualifier: Diagnosis of  By:  Maple Hudson MD, Clinton D       COPD mixed type (HCC)    PFT 11/02/13- mild obstructive airways disease with insignificant response to bronchodilator      Upper airway cough syndrome    Trial of max gerd rx 04/28/2015 >>>         Acute maxillary sinusitis      Allergies  Allergen Reactions  . Penicillins Swelling  . Cefuroxime Swelling  . Excedrin Tension Headache [Acetaminophen-Caffeine] Swelling  . Hydrocodone Nausea Only  . Codeine Nausea Only    Immunization History  Administered Date(s) Administered  . Influenza Split 12/31/2010  . Influenza Whole 12/30/2008  . Influenza,inj,Quad PF,6+ Mos 02/16/2014, 12/23/2014, 01/01/2016  . Influenza-Unspecified 11/30/2012, 02/13/2014  . Pneumococcal Conjugate-13 05/16/2014  . Pneumococcal Polysaccharide-23 04/01/2006  . Pneumococcal-Unspecified 12/01/2007    Past Medical History:  Diagnosis Date  . ADHD (attention deficit hyperactivity disorder)   . ALLERGIC RHINITIS   . Asthma   . Claustrophobia    unable to ride in elavator;leave room door open  . COPD (chronic obstructive pulmonary disease) (HCC)   . History of frequent urinary tract infections   . Migraine headache    since childhood  . PONV (postoperative nausea and vomiting)    Pt states Zofran does not relieve sx; prefers Phenergan  . Post-menopause on HRT (hormone replacement therapy)   . Restless leg   . Stroke [facility])     Tobacco History: Social History   Tobacco Use  Smoking Status Former Smoker  . Packs/day: 2.00  . Years: 25.00  .  Pack years: 50.00  . Types: Cigarettes  . Last attempt to quit: 04/02/2003  . Years since quitting: 14.7  Smokeless Tobacco Never Used   Counseling given: Not Answered  Continue to not smoke  Outpatient Encounter Medications as of 12/15/2017  Medication Sig  . acetaminophen (TYLENOL) 325 MG tablet Take 650 mg by mouth every 6 (six) hours as needed for headache.  . albuterol (PROVENTIL HFA) 108 (90 Base) MCG/ACT inhaler  Inhale 2 puffs into the lungs every 4 (four) hours as needed for wheezing or shortness of breath. ProAir  . aspirin EC 81 MG tablet Take 81 mg by mouth daily.  . benzonatate (TESSALON) 200 MG capsule Take 1 capsule (200 mg total) by mouth 3 (three) times daily as needed for cough.  . clonazePAM (KLONOPIN) 2 MG tablet Take 2 mg by mouth at bedtime. For restless legs  . fluticasone (FLONASE) 50 MCG/ACT nasal spray Place 2 sprays into both nostrils daily.  . Glycopyrrolate-Formoterol (BEVESPI AEROSPHERE) 9-4.8 MCG/ACT AERO Inhale 2 puffs into the lungs 2 (two) times daily.  . montelukast (SINGULAIR) 10 MG tablet TAKE 1 TABLET BY MOUTH EVERY DAY  . Multiple Vitamin (MULTIVITAMIN WITH MINERALS) TABS tablet Take 1 tablet by mouth daily.  [name] venlafaxine XR (EFFEXOR-XR) 75 MG 24 hr capsule TAKE 1 CAPSULE (75 MG TOTAL) BY MOUTH DAILY WITH BREAKFAST.  [name] predniSONE (DELTASONE) 10 MG tablet 4 tabs for 2 days, then 3 tabs for 2 days, 2 tabs for 2 days, then 1 tab for 2 days, then stop  . [DISCONTINUED] azithromycin (ZITHROMAX) 250 MG tablet Take as directed (Patient not taking: Reported on 12/15/2017)  . [DISCONTINUED] doxycycline (VIBRA-TABS) 100 MG tablet Take 1 tablet (100 mg total) by mouth 2 (two) times daily. (Patient not taking: Reported on 12/15/2017)   No facility-administered encounter medications on file as of 12/15/2017.      Review of Systems  Review of Systems  Constitutional: Positive for fatigue. Negative for chills, fever and unexpected weight change.  HENT: Positive for congestion (yellow mucous), postnasal drip, sinus pressure and sinus pain. Negative for ear pain.   Respiratory: Positive for cough (yellow mucous) and wheezing (worse when laying down ). Negative for chest tightness and shortness of breath.   Cardiovascular: Negative for chest pain and palpitations.  Gastrointestinal: Negative for blood in stool, diarrhea, nausea and vomiting.  Genitourinary: Negative for dysuria, frequency  and urgency.  Musculoskeletal: Negative for arthralgias.  Skin: Negative for color change.  Allergic/Immunologic: Positive for environmental allergies. Negative for food allergies.  Neurological: Positive for headaches (migraine today ). Negative for dizziness and light-headedness.  Psychiatric/Behavioral: Negative for dysphoric mood. The patient is not nervous/anxious.   All other systems reviewed and are negative.    Physical Exam  BP 124/68 (BP Location: Right Arm, Cuff Size: Normal)   Pulse 80   Temp 97.7 F (36.5 C) (Oral)   Ht 5' 6.5" (1.689 m)   Wt 175 lb 6.4 oz (79.6 kg)   SpO2 96%   BMI 27.89 kg/m   Wt Readings from Last 5 Encounters:  12/15/17 175 lb 6.4 oz (79.6 kg)  12/08/17 177 lb 12.8 oz (80.6 kg)  11/06/17 173 lb (78.5 kg)  09/04/17 182 lb 6.4 oz (82.7 kg)  07/22/17 185 lb (83.9 kg)    Physical Exam  Constitutional: She is oriented to person, place, and time and well-developed, well-nourished, and in no distress. Vital signs are normal. No distress.  HENT:  Head: Normocephalic and atraumatic.  Right Ear:  Hearing, tympanic membrane, external ear and ear canal normal.  Left Ear: Hearing, tympanic membrane, external ear and ear canal normal.  Nose: Mucosal edema and rhinorrhea present. Right sinus exhibits maxillary sinus tenderness. Right sinus exhibits no frontal sinus tenderness. Left sinus exhibits no maxillary sinus tenderness and no frontal sinus tenderness.  Mouth/Throat: Uvula is midline and oropharynx is clear and moist. No oropharyngeal exudate.  Eyes: Pupils are equal, round, and reactive to light.  Neck: Normal range of motion. Neck supple. No JVD present.  Cardiovascular: Normal rate, regular rhythm and normal heart sounds.  Pulmonary/Chest: Effort normal and breath sounds normal. No accessory muscle usage. No respiratory distress. She has no decreased breath sounds. She has no wheezes. She has no rhonchi.  Abdominal: Soft. Bowel sounds are normal.  There is no tenderness.  Musculoskeletal: Normal range of motion. She exhibits no edema.  Lymphadenopathy:    She has no cervical adenopathy.  Neurological: She is alert and oriented to person, place, and time. Gait normal.  Skin: Skin is warm and dry. She is not diaphoretic. No erythema.  Psychiatric: Mood, memory, affect and judgment normal.  Nursing note and vitals reviewed.     Lab Results:  CBC    Component Value Date/Time   WBC 7.2 07/31/2015 1905   RBC 4.38 07/31/2015 1905   HGB 13.3 07/31/2015 1905   HCT 41.0 07/31/2015 1905   PLT 253 07/31/2015 1905   MCV 93.6 07/31/2015 1905   MCH 30.4 07/31/2015 1905   MCHC 32.4 07/31/2015 1905   RDW 13.3 07/31/2015 1905   LYMPHSABS 2.2 06/17/2014 1601   MONOABS 0.6 06/17/2014 1601   EOSABS 0.3 06/17/2014 1601   BASOSABS 0.0 06/17/2014 1601    BMET    Component Value Date/Time   NA 142 12/15/2017 1207   K 3.8 12/15/2017 1207   CL 105 12/15/2017 1207   CO2 33 (H) 12/15/2017 1207   GLUCOSE 122 (H) 12/15/2017 1207   BUN 22 12/15/2017 1207   CREATININE 0.88 12/15/2017 1207   CALCIUM 9.0 12/15/2017 1207   GFRNONAA 55 (L) 07/31/2015 1905   GFRAA >60 07/31/2015 1905    BNP No results found for: BNP  ProBNP No results found for: PROBNP  Imaging: No results found.    Assessment & Plan:   62 year old female patient seen today for acute visit.  Patient with recurrent right maxillary sinusitis.  Unfortunately patient's penicillin allergy unable to treat with Augmentin.  With recent failure to doxycycline we discussed the pros and cons of Levaquin.  We will proceed forward with a Levaquin prescription based off patient's lab work today.  If symptoms are not improving regarding sinusitis with 2 rounds of antibiotics patient will need CT sinus.  Acute maxillary sinusitis Lab work today >>>Bmet  We will call you once we receive the results of your bmet to start an antibiotic  Prednisone 10mg  tablet  >>>4 tabs for 2 days,  then 3 tabs for 2 days, 2 tabs for 2 days, then 1 tab for 2 days, then stop >>>take with food  >>>take in the morning   Please start probiotic daily, continue this for at least the next 90 days as you have had multiple antibiotics in the past month  COPD mixed type Bevespi Aerosphere inhaler >>>2 puffs daily twice a day (4 puffs total daily) >>>This is not a rescue inhaler >>>You take this daily no matter what      Coral CeoBrian P [place] Kuang, NP 12/15/2017

## 2017-12-15 NOTE — Assessment & Plan Note (Signed)
Lab work today >>>Bmet  We will call you once we receive the results of your bmet to start an antibiotic  Prednisone 10mg  tablet  >>>4 tabs for 2 days, then 3 tabs for 2 days, 2 tabs for 2 days, then 1 tab for 2 days, then stop >>>take with food  >>>take in the morning   Please start probiotic daily, continue this for at least the next 90 days as you have had multiple antibiotics in the past month

## 2017-12-15 NOTE — Telephone Encounter (Signed)
Called and spoke to pt.  Pt states she completed doxy yesterday. Sx improved but have not subsided.  Pt reports of prod cough with greenish mucus, sinus pressure & wheezing  Denied fever, chills or sweats.  Pt has been scheduled for acute visit with Brain Mack today at 11:30a. Nothing further is needed.

## 2017-12-15 NOTE — Progress Notes (Signed)
Kidney functioning is ok.  Please place order for Levaquin 500 mg daily for 5 days, associate with her maxillary sinusitis.  Follow-up with our office if symptoms are not improving.  Elisha HeadlandBrian Orvill Coulthard, FNP

## 2017-12-15 NOTE — Telephone Encounter (Signed)
Called and spoke with pharmacist. They are worried about the drug to drug interaction.   Arlys JohnBrian please advise, thank you.

## 2017-12-15 NOTE — Assessment & Plan Note (Signed)
Bevespi Aerosphere inhaler >>>2 puffs daily twice a day (4 puffs total daily) >>>This is not a rescue inhaler >>>You take this daily no matter what

## 2017-12-16 NOTE — Telephone Encounter (Signed)
Called and spoke to pt and relayed below instructions.  Pt agreed with Brian's recommendations listed below.  I have spoken to Lauren at [facility] and relayed below recommendations. Nothing further is needed.

## 2017-12-16 NOTE — Telephone Encounter (Signed)
Please call patient and inform them to hold her effexor for the next 5 days while she takes the Levaquin. When finished with Levaquin then restart effexor the following day.   Unfortunately we are limited with patients recent antibiotic failure to doxy, recurrent sinusitis, and PCN allergy.   If patient is willing to proceed with this plan of care, then please contact pharmacy and inform them of the plan of care.   Elisha HeadlandBrian Mack FNP

## 2017-12-16 NOTE — Telephone Encounter (Signed)
Patient returning call, CB is [phone].

## 2017-12-16 NOTE — Telephone Encounter (Signed)
ATC pt- unable to leave vm, as mailbox is full.  Will call back 

## 2017-12-23 ENCOUNTER — Telehealth: Payer: Self-pay | Admitting: Internal Medicine

## 2017-12-23 DIAGNOSIS — J324 Chronic pansinusitis: Secondary | ICD-10-CM

## 2017-12-23 NOTE — Telephone Encounter (Signed)
Patient was seen on 09/09 and 09/16.  Doesn't know if Dr. Maple HudsonYoung wants her to be seen again or try a different medication.  Pharm is [facility] on [address]

## 2017-12-23 NOTE — Telephone Encounter (Signed)
Spoke with patient. She was seen by Elisha HeadlandBrian Mack NP on 9/16. She stated that she is still not feeling well. She has a headache, non-productive cough, eyes feel swollen and her nose hurts. She is also feeling nauseous and dizzy. She stated that she was told that if she wasn't feeling any better this week that she may need to have a scan done.   CY, please advise.  Thanks!   Current Outpatient Medications on File Prior to Visit  Medication Sig Dispense Refill  . acetaminophen (TYLENOL) 325 MG tablet Take 650 mg by mouth every 6 (six) hours as needed for headache.    . albuterol (PROVENTIL HFA) 108 (90 Base) MCG/ACT inhaler Inhale 2 puffs into the lungs every 4 (four) hours as needed for wheezing or shortness of breath. ProAir 18 g 12  . aspirin EC 81 MG tablet Take 81 mg by mouth daily.    . benzonatate (TESSALON) 200 MG capsule Take 1 capsule (200 mg total) by mouth 3 (three) times daily as needed for cough. 30 capsule 12  . clonazePAM (KLONOPIN) 2 MG tablet Take 2 mg by mouth at bedtime. For restless legs  0  . fluticasone (FLONASE) 50 MCG/ACT nasal spray Place 2 sprays into both nostrils daily.    . Glycopyrrolate-Formoterol (BEVESPI AEROSPHERE) 9-4.8 MCG/ACT AERO Inhale 2 puffs into the lungs 2 (two) times daily. 3 Inhaler 0  . levofloxacin (LEVAQUIN) 500 MG tablet Take 1 tablet (500 mg total) by mouth daily. 5 tablet 0  . montelukast (SINGULAIR) 10 MG tablet TAKE 1 TABLET BY MOUTH EVERY DAY 90 tablet 3  . Multiple Vitamin (MULTIVITAMIN WITH MINERALS) TABS tablet Take 1 tablet by mouth daily.    . predniSONE (DELTASONE) 10 MG tablet 4 tabs for 2 days, then 3 tabs for 2 days, 2 tabs for 2 days, then 1 tab for 2 days, then stop 20 tablet 0  . venlafaxine XR (EFFEXOR-XR) 75 MG 24 hr capsule TAKE 1 CAPSULE (75 MG TOTAL) BY MOUTH DAILY WITH BREAKFAST. 90 capsule 2   No current facility-administered medications on file prior to visit.    Allergies  Allergen Reactions  . Penicillins Swelling  .  Cefuroxime Swelling  . Excedrin Tension Headache [Acetaminophen-Caffeine] Swelling  . Hydrocodone Nausea Only  . Codeine Nausea Only

## 2017-12-23 NOTE — Telephone Encounter (Signed)
Suggest we order CT max face limited, for dx acute pansinusitis  If she is not already doing it, suggest she start once daily nasal saline rinse, such as NeilMed, otc

## 2017-12-23 NOTE — Telephone Encounter (Signed)
I have called the patient and made her aware of these recommendation and have placed the order nothing further needed at this time.

## 2017-12-25 ENCOUNTER — Telehealth: Payer: Self-pay | Admitting: Internal Medicine

## 2017-12-25 ENCOUNTER — Ambulatory Visit
Admission: RE | Admit: 2017-12-25 | Discharge: 2017-12-25 | Disposition: A | Discharge status: Home / Self Care | Payer: BLUE CROSS/BLUE SHIELD | Source: Ambulatory Visit | Attending: Internal Medicine | Admitting: Internal Medicine

## 2017-12-25 DIAGNOSIS — J324 Chronic pansinusitis: Secondary | ICD-10-CM

## 2017-12-25 NOTE — Telephone Encounter (Signed)
Received call report from [facility] with [facility] on patient's sinus CT done on 12/25/17. No evidence of sinusitis. No acute findings  Will route to Dr. Maple Hudson

## 2018-05-07 NOTE — Progress Notes (Signed)
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  UPDATE: Headaches are well controlled. Intensity:  7-8/10 Duration: 1 to 2 hours with ibuprofen or Tylenol Frequency: 3 days a month Current NSAIDS:  ASA 81mg , ibuprofen Current analgesics:  Tylenol  Current triptans:  contraindicated Current anti-emetic:  no Current muscle relaxants:  no Current anti-anxiolytic:  clonazepam 2mg  at bedtime Current sleep aide:  clonazepam 2mg  Current Antihypertensive medications:  no Current Antidepressant medications:  Venlafaxine 75mg  three times daily, Current Anticonvulsant medications:  no Current Vitamins/Herbal/Supplements:  MVI Current Antihistamines/Decongestants:  No  On occasion, she reports dizziness described as sensation that she is going to pass out" with blackening of vision.  It occurs on occasion if she is on her knees doing something.    Caffeine:  1 cup coffee daily, tea Alcohol:  no Smoker:  no Diet:  okay Exercise:  no Depression/anxiety:  She continues to have anxiety since stroke.   She continues to have memory deficits since the stroke.  HISTORY:  Onset: Since she had an embolic right frontal ischemic stroke in March 2016, which was demonstrated on MRI of brain.  MRA showed occlusion of right M2 branch, felt to be embolic.  She was found to have a PFO with atrial septal aneurysm, which was subsequently closed.  EF was 55%.  DVT was not found.  She had been taking Plavix but now just ASA 81mg  daily.  She is not on a statin.  Headaches started soon after the stroke.  MRI of brain from 07/17/15, which was personally reviewed, again revealed remote right insular and parietal lobe infarcts but nothing acute.  Location:  Bi-frontal She has a constant  headache but fluctuates in intensity.  When she has a severe headache: Quality:  pounding Initial Intensity:  10/10 Aura:  Only once she saw a bright light Prodrome:  no Associated symptoms: None.  No associated nausea, vomiting, photophobia, phonophobia, visual disturbance or unilateral numbness or weakness. Initial Duration:  3 to 4 hours Initial Frequency:  3 to 4 days per week Triggers/aggravating factors: Bending over Relieving factors: Rubbing to her temples Activity:  Lays down  Past NSAIDS:  no Past analgesics:  Excedrin (side effects) Past abortive triptans:  contraindicated Past antihypertensive medications:  no Past antidepressant medications:  Bupropion, amitriptyline (side effects), Lexapro 20mg  Past anticonvulsant medications:  topiramate Other past therapy:  Botox (ineffective, made eyes heavy), CBD oil (ineffective) Past vitamins/Herbal/Supplements: no  Fears another stroke Sleep hygiene:  good  She used to work at a [employer].  She has been on disability since the stroke.  Since her stroke, she has had memory issues.  She forgets things quickly.  On rare occasions, she briefly is unsure where she is while driving but quickly re-orients herself.  She has trouble with names, even people she has known a while, such as her neighbor.  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  She feels it has been unchanged since the stroke.  Due to memory problems, she underwent neuropsychological testing on 02/15/16.  She demonstrated impairment in sustained attention, processing speed, visual-spatial skills, and executive functioning, which is consistent with mild vascular cognitive impairment.  She has history of ADHD and exhibits attention deficits on exam.  She is currently not treated for ADHD.  PAST MEDICAL HISTORY: Past Medical History:  Diagnosis Date  . ADHD (attention deficit hyperactivity disorder)   . ALLERGIC RHINITIS   . Asthma    . Claustrophobia    unable to ride in elavator;leave room door open  . COPD (chronic obstructive pulmonary disease) (HCC)   . History of frequent urinary tract infections   . Migraine headache    since childhood  . PONV (postoperative nausea and vomiting)    Pt states Zofran does not relieve sx; prefers Phenergan  . Post-menopause on HRT (hormone replacement therapy)   . Restless leg   . Stroke [facility])     MEDICATIONS: Current Outpatient Medications on File Prior to Visit  Medication Sig Dispense Refill  . acetaminophen (TYLENOL) 325 MG tablet Take 650 mg by mouth every 6 (six) hours as needed for headache.    . albuterol (PROVENTIL HFA) 108 (90 Base) MCG/ACT inhaler Inhale 2 puffs into the lungs every 4 (four) hours as needed for wheezing or shortness of breath. ProAir 18 g 12  . aspirin EC 81 MG tablet Take 81 mg by mouth daily.    . benzonatate (TESSALON) 200 MG capsule Take 1 capsule (200 mg total) by mouth 3 (three) times daily as needed for cough. 30 capsule 12  . clonazePAM (KLONOPIN) 2 MG tablet Take 2 mg by mouth at bedtime. For restless legs  0  . fluticasone (FLONASE) 50 MCG/ACT nasal spray Place 2 sprays into both nostrils daily.    . Glycopyrrolate-Formoterol (BEVESPI AEROSPHERE) 9-4.8 MCG/ACT AERO Inhale 2 puffs into the lungs 2 (two) times daily. 3 Inhaler 0  . levofloxacin (LEVAQUIN) 500 MG tablet Take 1 tablet (500 mg total) by mouth daily. 5 tablet 0  . montelukast (SINGULAIR) 10 MG tablet TAKE 1 TABLET BY MOUTH EVERY DAY 90 tablet 3  . Multiple Vitamin (MULTIVITAMIN WITH MINERALS) TABS tablet Take 1 tablet by mouth daily.    . predniSONE (DELTASONE) 10 MG tablet 4 tabs for 2 days, then 3 tabs for 2 days, 2 tabs for 2 days, then 1 tab for 2 days, then stop 20 tablet 0  . venlafaxine XR (EFFEXOR-XR) 75 MG 24 hr capsule TAKE 1 CAPSULE (75 MG TOTAL) BY MOUTH DAILY WITH BREAKFAST. 90 capsule 2   No current facility-administered medications on file prior to visit.      ALLERGIES: Allergies  Allergen Reactions  . Penicillins Swelling  . Cefuroxime Swelling  . Excedrin Tension Headache [Acetaminophen-Caffeine] Swelling  . Hydrocodone Nausea Only  . Codeine Nausea Only    FAMILY HISTORY: Family History  Problem Relation Age of Onset  . Lung cancer Brother   . Asthma Mother   . Allergies Mother   . Heart disease Father        cause of death  . Breast cancer Cousin    SOCIAL HISTORY: Social History   Socioeconomic History  . Marital status: Married    Spouse name: Not on file  . Number of children: 3  . Years of education: Not on file  . Highest education level: Not on file  Occupational History  . Occupation: Advertising account planner: [employer]  Social Needs  . Financial resource strain: Not on file  . Food insecurity:    Worry: Not on file    Inability: Not on file  . Transportation needs:    Medical: Not on file    Non-medical: Not on file  Tobacco Use  .  Smoking status: Former Smoker    Packs/day: 2.00    Years: 25.00    Pack years: 50.00    Types: Cigarettes    Last attempt to quit: 04/02/2003    Years since quitting: 15.1  . Smokeless tobacco: Never Used  Substance and Sexual Activity  . Alcohol use: No  . Drug use: No  . Sexual activity: Yes  Lifestyle  . Physical activity:    Days per week: Not on file    Minutes per session: Not on file  . Stress: Not on file  Relationships  . Social connections:    Talks on phone: Not on file    Gets together: Not on file    Attends religious service: Not on file    Active member of club or organization: Not on file    Attends meetings of clubs or organizations: Not on file    Relationship status: Not on file  . Intimate partner violence:    Fear of current or ex partner: Not on file    Emotionally abused: Not on file    Physically abused: Not on file    Forced sexual activity: Not on file  Other Topics Concern  . Not on file  Social History Narrative  . Not  on file    REVIEW OF SYSTEMS: Constitutional: No fevers, chills, or sweats, no generalized fatigue, change in appetite Eyes: No visual changes, double vision, eye pain Ear, nose and throat: No hearing loss, ear pain, nasal congestion, sore throat Cardiovascular: No chest pain, palpitations Respiratory:  No shortness of breath at rest or with exertion, wheezes GastrointestinaI: No nausea, vomiting, diarrhea, abdominal pain, fecal incontinence Genitourinary:  No dysuria, urinary retention or frequency Musculoskeletal:  No neck pain, back pain Integumentary: No rash, pruritus, skin lesions Neurological: as above Psychiatric: No depression, insomnia, anxiety Endocrine: No palpitations, fatigue, diaphoresis, mood swings, change in appetite, change in weight, increased thirst Hematologic/Lymphatic:  No purpura, petechiae. Allergic/Immunologic: no itchy/runny eyes, nasal congestion, recent allergic reactions, rashes  PHYSICAL EXAM: Blood pressure 120/80, pulse 93, height 5' 6.5" (1.689 m), weight 188 lb (85.3 kg), SpO2 92 %. General: No acute distress.  Patient appears well-groomed.  Head:  Normocephalic/atraumatic Eyes:  Fundi examined but not visualized Neck: supple, no paraspinal tenderness, full range of motion Heart:  Regular rate and rhythm Lungs:  Clear to auscultation bilaterally Back: No paraspinal tenderness Neurological Exam: alert and oriented to person, place, and time. Attention span and concentration intact, recent and remote memory intact, fund of knowledge intact.  Speech fluent and not dysarthric, language intact.  CN II-XII intact. Bulk and tone normal, muscle strength 5/5 throughout.  Sensation to light touch intact.  Deep tendon reflexes 2+ throughout, toes downgoing.  Finger to nose testing intact.  Gait normal, Romberg negative.  IMPRESSION: 1.  Migraine without aura, without status migrainosus, not intractable 2.  Tension type headache, not intractable 3.  Mild  vascular cognitive impairment 4.  History of embolic right MCA territory stroke status post PFO closure  PLAN: 1.  Venlafaxine 75 mg three times daily for migraine prevention 2.  Limit use of Tylenol or ibuprofen to no more than 2 days out of the week to prevent rebound headache. 3.  Keep headache diary 4.  Aspirin 81 mg for secondary stroke prevention.  Recommend statin therapy. 5.  Follow-up in 6 months.  Shon Millet, DO  CC: Kaleen Mask, MD

## 2018-05-11 ENCOUNTER — Ambulatory Visit (INDEPENDENT_AMBULATORY_CARE_PROVIDER_SITE_OTHER): Payer: BLUE CROSS/BLUE SHIELD | Admitting: Neurology

## 2018-05-11 ENCOUNTER — Ambulatory Visit: Payer: BLUE CROSS/BLUE SHIELD | Admitting: Neurology

## 2018-05-11 ENCOUNTER — Encounter: Payer: Self-pay | Admitting: Neurology

## 2018-05-11 VITALS — BP 120/80 | HR 93 | Ht 66.5 in | Wt 188.0 lb

## 2018-05-11 DIAGNOSIS — I63411 Cerebral infarction due to embolism of right middle cerebral artery: Secondary | ICD-10-CM | POA: Diagnosis not present

## 2018-05-11 DIAGNOSIS — G43009 Migraine without aura, not intractable, without status migrainosus: Secondary | ICD-10-CM | POA: Diagnosis not present

## 2018-05-11 NOTE — Patient Instructions (Signed)
1.  For preventative management, continue venlafaxine 2.  For abortive therapy, Tylenol or ibuprofen 3.  Limit use of pain relievers to no more than 2 days out of week to prevent risk of rebound or medication-overuse headache. 4.  Keep headache diary 5.  Exercise, hydration, caffeine cessation, sleep hygiene, monitor for and avoid triggers 6.  Consider:  magnesium citrate 400mg  daily, riboflavin 400mg  daily, and coenzyme Q10 100mg  three times daily 7. Continue aspirin 81mg  daily for secondary stroke prevention 8. Follow up in one year

## 2018-06-02 ENCOUNTER — Telehealth: Payer: Self-pay | Admitting: Neurology

## 2018-06-02 ENCOUNTER — Ambulatory Visit (INDEPENDENT_AMBULATORY_CARE_PROVIDER_SITE_OTHER): Payer: BLUE CROSS/BLUE SHIELD

## 2018-06-02 DIAGNOSIS — G43009 Migraine without aura, not intractable, without status migrainosus: Secondary | ICD-10-CM | POA: Diagnosis not present

## 2018-06-02 MED ORDER — METOCLOPRAMIDE HCL 5 MG/ML IJ SOLN
10.0000 mg | Freq: Once | INTRAVENOUS | Status: AC
  Administered 2018-06-02: 10 mg via INTRAMUSCULAR

## 2018-06-02 MED ORDER — KETOROLAC TROMETHAMINE 60 MG/2ML IM SOLN
60.0000 mg | Freq: Once | INTRAMUSCULAR | Status: AC
  Administered 2018-06-02: 60 mg via INTRAMUSCULAR

## 2018-06-02 MED ORDER — DIPHENHYDRAMINE HCL 50 MG/ML IJ SOLN
25.0000 mg | Freq: Once | INTRAMUSCULAR | Status: AC
  Administered 2018-06-02: 25 mg via INTRAMUSCULAR

## 2018-06-02 NOTE — Telephone Encounter (Signed)
Called and spoke with Pt. She has been taking advil with no relief. She states she hasn't had a migraine this intense in years. Pt is coming for  Migraine cocktail, her husband is driving her.

## 2018-06-02 NOTE — Telephone Encounter (Signed)
Patient is calling back about her migraine she has had since yesterday and would like to come in today for a shot please call

## 2018-06-02 NOTE — Telephone Encounter (Signed)
Patient called in due to having a Migraine since Yesterday. She is asking to come in today for a shot. Please Call. Thanks

## 2018-06-08 ENCOUNTER — Ambulatory Visit (INDEPENDENT_AMBULATORY_CARE_PROVIDER_SITE_OTHER): Payer: BLUE CROSS/BLUE SHIELD | Admitting: Internal Medicine

## 2018-06-08 ENCOUNTER — Encounter: Payer: Self-pay | Admitting: Internal Medicine

## 2018-06-08 VITALS — BP 126/74 | HR 87 | Temp 98.0°F | Ht 66.5 in | Wt 184.4 lb

## 2018-06-08 DIAGNOSIS — Q211 Atrial septal defect, unspecified: Secondary | ICD-10-CM

## 2018-06-08 DIAGNOSIS — I25119 Atherosclerotic heart disease of native coronary artery with unspecified angina pectoris: Secondary | ICD-10-CM | POA: Diagnosis not present

## 2018-06-08 DIAGNOSIS — J449 Chronic obstructive pulmonary disease, unspecified: Secondary | ICD-10-CM | POA: Diagnosis not present

## 2018-06-08 MED ORDER — IPRATROPIUM-ALBUTEROL 0.5-2.5 (3) MG/3ML IN SOLN: 3.0000 mL | Freq: Four times a day (QID) | RESPIRATORY_TRACT | 12 refills | Status: DC | PRN

## 2018-06-08 MED ORDER — PROMETHAZINE-CODEINE 6.25-10 MG/5ML PO SYRP: 5.0000 mL | ORAL_SOLUTION | Freq: Four times a day (QID) | ORAL | 0 refills | Status: DC | PRN

## 2018-06-08 MED ORDER — AZITHROMYCIN 250 MG PO TABS: ORAL_TABLET | ORAL | 0 refills | Status: DC

## 2018-06-08 MED ORDER — LEVALBUTEROL HCL 0.63 MG/3ML IN NEBU
0.6300 mg | INHALATION_SOLUTION | Freq: Once | RESPIRATORY_TRACT | Status: AC
  Administered 2018-06-08: 0.63 mg via RESPIRATORY_TRACT

## 2018-06-08 MED ORDER — COMPRESSOR/NEBULIZER MISC: 0 refills | Status: AC

## 2018-06-08 MED ORDER — METHYLPREDNISOLONE ACETATE 80 MG/ML IJ SUSP
80.0000 mg | Freq: Once | INTRAMUSCULAR | Status: AC
  Administered 2018-06-08: 80 mg via INTRAMUSCULAR

## 2018-06-08 NOTE — Assessment & Plan Note (Signed)
Previous ASD repair, hx CVA, atherosclerosis, complaining of persistent non-specific "lack of energy", and her cardiologist has retired. Plan- refer to establish with cardiology. May need CAD risk stratification.

## 2018-06-08 NOTE — Progress Notes (Addendum)
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  FVC 1.62/45%, FEV1 1.12/40%, ratio 0.69, FEF 25-75% 0.69/27% ------------------------------------------------------------------------- 12/08/2017- 63 yo female former smoker followed for allergic rhinitis, asthma/COPD, complicated by embolic stroke RMCA/ ASD closed 2016 Enrolled in Lung Cancer Screening CT gram by Kandice Robinsons. -----Acute visit- On 12/05/17 started with nasal congestion, cough productive mucus is yellow to green. Denies any fever. Bevespi, Singulair, albuterol HFA, Tessalon Child brought respiratory infection home from school.  Started with nasal congestion, headache, drainage.  Now moved into chest with productive cough, green. We discussed the results of her initial CT screening for lung nodules. CT lung screening 10/08/2017-  1.6 mm RUL nodule. Benign appearance tracking.  06/07/2018-  63 yo female former smoker followed for allergic rhinitis, asthma/COPD, complicated by embolic stroke RMCA/ ASD closed 2016 Enrolled in Lung Cancer Screening CT gram by Kandice Robinsons. Bevespi, Singulair, albuterol HFA, Tessalon ------pt states she has a cold, sinus congestion, cough w/ yellowish-green mucus, chest tightness, occasional SOB We discussed current episode in relationship to the sustained sinusitis she had last fall. Deep cough. Would like her own nebulizer. COPD exacerbations break-through control with current meds, especially with infections and seasonal pollens.  She can tolerate codeine cough syrup but it makes her drowsy. Tessalon is not strong enough to help her cough. Asks for depomedrol inj. Complains of "no energy" which she says she began noticing after her  atrial septal procedure.  Her cardiologist has retired.  CT imaging has shown atherosclerosis.  I suggested it was time for [place] to get her a new cardiologist. CT max fac 12/25/17- No evidence of sinusitis. No acute findings.                 ROS-see HPI   + = positive Constitutional:   No-   weight loss, night sweats, fevers, chills, fatigue, lassitude. HEENT:   + headaches, difficulty swallowing, tooth/dental problems, sore throat,       No-  sneezing, itching, ear ache,  +nasal congestion, post nasal drip,  CV:  No-   chest pain, orthopnea, PND, swelling in lower extremities, anasarca, dizziness, palpitations Resp:   shortness of breath with exertion or at rest.                 +productive cough,  No non-productive cough,  No- coughing up of blood.              + change in color of mucus.   +wheezing.   Skin: No-   rash or lesions. GI:  No-   heartburn, indigestion, abdominal pain, nausea, vomiting, GU:  MS:  No-   joint pain or swelling.   Neuro-     nothing unusual Psych:  No- change in mood or affect. No depression or anxiety.  No memory loss.  OBJ- Physical Exam   General- Alert, Oriented, Affect-appropriate, Distress- none acute Skin- rash-none, lesions- none, excoriation- none Lymphadenopathy- none Head- atraumatic            Eyes- Gross vision intact, PERRLA, conjunctivae and secretions clear            Ears- Hearing, canals-normal            Nose- + turbinate edema, no-Septal dev, mucus, polyps, erosion, perforation  Throat- Mallampati II , mucosa cobblestoned , drainage- none, tonsils- atrophic,                                + hoarse Neck- flexible , trachea midline, no stridor , thyroid nl, carotid no bruit Chest - symmetrical excursion , unlabored           Heart/CV- RRR , no murmur , no gallop  , no rub, nl s1 s2                           - JVD- none , edema- none, stasis changes- none, varices- none           Lung- + diminished / coarse, wheeze- none, cough+  , dullness-none, rub- none           Chest wall-  Abd-  Br/ Gen/ Rectal- Not done, not indicated Extrem- cyanosis- none, clubbing, none, atrophy- none, strength- nl Neuro- grossly intact to observation

## 2018-06-08 NOTE — Patient Instructions (Addendum)
Script printed for DME company- compressor nebulizer machine and Duoneb neb solution     Dx COPD mixed type  Script sent for codeine cough syrup- use sparingly as needed  Script sent for Zpak antibiotic  Order- Referral to [facility] cardiology    CAD risk stratification  Order- neb xop 0.63    Dx COPD mixed type             Depo 80

## 2018-06-08 NOTE — Assessment & Plan Note (Addendum)
Acute bronchitis/ URI. Discussed management as viral. Acknowledged previous sinus infection and agreed to antibiotic. Needs to be able to better self-manage exacerbations. Best approach would be home nebulizer with ipratropium-albuterol. Plan- neb treatment here- xopenex, depomedrol, Zpak, home nebulizer with Duoneb.

## 2018-06-10 ENCOUNTER — Telehealth: Payer: Self-pay | Admitting: Internal Medicine

## 2018-06-10 MED ORDER — IPRATROPIUM-ALBUTEROL 0.5-2.5 (3) MG/3ML IN SOLN: 3.0000 mL | Freq: Four times a day (QID) | RESPIRATORY_TRACT | 12 refills | Status: DC

## 2018-06-10 NOTE — Telephone Encounter (Signed)
I called [name] to advise her but she did not answer. I left a detailed message advising her. I faxed new Rx to [phone]. Nothing further is needed.

## 2018-06-10 NOTE — Addendum Note (Signed)
Addended by: Jetty Duhamel D on: 06/10/2018 03:36 PM   Modules accepted: Orders

## 2018-06-10 NOTE — Telephone Encounter (Signed)
Spoke with [name], she needs additional documentation for the nebulizer order. We need to add exactly what she is needing (nebulizer) and the medication that she needs with it, along with the frequency but we cannot put PRN because her insurance will not take it. We also need to add the diagnosis that is being treated with the nebulizer in the notes. She asked that the notes be revised and that we fax over another Rx to [phone] without PRN in the directions. Please advise CY if you can addend the note.  Patient Instructions by Waymon Budge, MD at 06/08/2018 9:30 AM  Author: Waymon Budge, MD Author Type: Physician Filed: 06/08/2018 10:23 AM  Note Status: Addendum Cosign: Cosign Not Required Encounter Date: 06/08/2018  Editor: Waymon Budge, MD (Physician)  Prior Versions: 1. Waymon Budge, MD (Physician) at 06/08/2018 10:17 AM - Addendum   2. Waymon Budge, MD (Physician) at 06/08/2018 10:14 AM - Signed    Script printed for DME company- compressor nebulizer machine and Duoneb neb solution     Dx COPD mixed type  Script sent for codeine cough syrup- use sparingly as needed  Script sent for Zpak antibiotic  Order- Referral to [facility] cardiology    CAD risk stratification  Order- neb xop 0.63    Dx COPD mixed type             Depo 80

## 2018-06-10 NOTE — Telephone Encounter (Signed)
Script rewritten for neb solution to remove the "prn" Note addended. Let me know if more needed

## 2018-06-12 ENCOUNTER — Ambulatory Visit (INDEPENDENT_AMBULATORY_CARE_PROVIDER_SITE_OTHER): Payer: BLUE CROSS/BLUE SHIELD | Admitting: Interventional Cardiology

## 2018-06-12 ENCOUNTER — Other Ambulatory Visit: Payer: Self-pay

## 2018-06-12 ENCOUNTER — Encounter: Payer: Self-pay | Admitting: Interventional Cardiology

## 2018-06-12 ENCOUNTER — Encounter

## 2018-06-12 VITALS — BP 138/94 | HR 92 | Ht 66.5 in | Wt 182.8 lb

## 2018-06-12 DIAGNOSIS — Z01812 Encounter for preprocedural laboratory examination: Secondary | ICD-10-CM

## 2018-06-12 DIAGNOSIS — R079 Chest pain, unspecified: Secondary | ICD-10-CM | POA: Diagnosis not present

## 2018-06-12 DIAGNOSIS — I7090 Unspecified atherosclerosis: Secondary | ICD-10-CM | POA: Diagnosis not present

## 2018-06-12 MED ORDER — METOPROLOL TARTRATE 100 MG PO TABS: ORAL_TABLET | ORAL | 0 refills | Status: DC

## 2018-06-12 NOTE — Patient Instructions (Signed)
Medication Instructions:  Your physician recommends that you continue on your current medications as directed. Please refer to the Current Medication list given to you today.  If you need a refill on your cardiac medications before your next appointment, please call your pharmacy.   Lab work: Your physician recommends that you return for lab work Designer, jewellery) prior to Cardiac CT   If you have labs (blood work) drawn today and your tests are completely normal, you will receive your results only by: [name] MyChart Message (if you have MyChart) OR . A paper copy in the mail If you have any lab test that is abnormal or we need to change your treatment, we will call you to review the results.  Testing/Procedures: Your physician has requested that you have cardiac CT. Cardiac computed tomography (CT) is a painless test that uses an x-ray machine to take clear, detailed pictures of your heart. For further information please visit [email or web address] Please follow instruction sheet as given.  Follow-Up: At [facility], you and your health needs are our priority.  As part of our continuing mission to provide you with exceptional heart care, we have created designated Provider Care Teams.  These Care Teams include your primary Cardiologist (physician) and Advanced Practice Providers (APPs -  Physician Assistants and Nurse Practitioners) who all work together to provide you with the care you need, when you need it. . You will need a follow up appointment in 1 year.  Please call our office 2 months in advance to schedule this appointment.  You may see Everette Rank, MD or one of the following Advanced Practice Providers on your designated Care Team:   . Robbie Lis, PA-C . Dayna Dunn, PA-C . Jacolyn Reedy, PA-C  Any Other Special Instructions Will Be Listed Below (If Applicable).  CARDIAC CT INSTRUCTIONS  Please arrive at the [facility] main entrance of [facility] on ____ at ____ (30-45  minutes prior to test start time)  [facility] [address] [place], [place] [address] [phone]  Proceed to the [facility] Radiology Department (First Floor).  Please follow these instructions carefully (unless otherwise directed):   On the Night Before the Test: . Be sure to Drink plenty of water. . Do not consume any caffeinated/decaffeinated beverages or chocolate 12 hours prior to your test. . Do not take any antihistamines 12 hours prior to your test.   On the Day of the Test: . Drink plenty of water. Do not drink any water within one hour of the test. . Do not eat any food 4 hours prior to the test. . You may take your regular medications prior to the test.  . Take metoprolol (Lopressor) two hours prior to test.     After the Test: . Drink plenty of water. . After receiving IV contrast, you may experience a mild flushed feeling. This is normal. . On occasion, you may experience a mild rash up to 24 hours after the test. This is not dangerous. If this occurs, you can take Benadryl 25 mg and increase your fluid intake. . If you experience trouble breathing, this can be serious. If it is severe call 911 IMMEDIATELY. If it is mild, please call our office.

## 2018-06-12 NOTE — Progress Notes (Signed)
Cardiology Office Note   Date:  06/12/2018   ID:  [name], DOB 04-25-1955, MRN [id number]  PCP:  Kaleen Mask, MD    No chief complaint on file.  F/u ASD closure  Wt Readings from Last 3 Encounters:  06/12/18 182 lb 12.8 oz (82.9 kg)  06/08/18 184 lb 6.4 oz (83.6 kg)  05/11/18 188 lb (85.3 kg)       History of Present Illness: [name] is a 63 y.o. female who is being seen today for the evaluation of ASD at the request of Young, Clinton D, MD.  She had an ASD closure in 2017.  She reports fatigue since that time.    She had a lung cancer screening CT showing: Mild atherosclerotic calcifications of the aortic arch.  Mild coronary atherosclerosis the LAD.  She smoked for years and stopped in 2005.  She does not exercise regularly.  She feels some tightness in her chest with walking up stairs and it resolves with rest.   Brother with CABG.      Past Medical History:  Diagnosis Date  . Acute maxillary sinusitis 12/08/2017  . ADHD (attention deficit hyperactivity disorder)   . ALLERGIC RHINITIS   . Asthma   . ASTHMA 02/27/2009   Qualifier: Diagnosis of  By: Roxan Hockey CMA, Shanda Bumps    . Atrial septal defect    prior atrial septal defect stable following closure with amplatzer device  . 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 Claustrophobia    unable to ride in elavator;leave room door open  . COPD (chronic obstructive pulmonary disease) (HCC)   . 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 COPD with acute bronchitis (HCC) 03/27/2009   Qualifier: Diagnosis of  By: Maple Hudson MD, Clinton D   . CVA (cerebral infarction) 07/14/2014  . Depression with anxiety 01/16/2015  . Fatigue    of uncertain etiology  . Former smoker 03/20/2017  . 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 History of frequent urinary tract infections   . Migraine headache    since childhood  .  Numbness 07/14/2014  . Other headache syndrome 09/12/2014  . Overweight   . Patent foramen ovale 07/12/2014  . PONV (postoperative nausea and vomiting)    Pt states Zofran does not relieve sx; prefers Phenergan  . Post-menopause on HRT (hormone replacement therapy)   . Restless leg   . Seasonal and perennial allergic rhinitis 02/27/2009   Qualifier: Diagnosis of  By: Roxan Hockey CMA, Shanda Bumps    . Stroke (HCC)   . Stroke occurring within last month 07/12/2014  . Upper airway cough syndrome 04/29/2015   Trial of max gerd rx 04/28/2015 >>>      . URI 03/13/2009   Qualifier: Diagnosis of  By: Maple Hudson MD, Rennis Chris     Past Surgical History:  Procedure Laterality Date  . ABDOMINAL HYSTERECTOMY    . amplatzer septal occluder  07/2014  . ANTERIOR CERVICAL DECOMP/DISCECTOMY FUSION  05/06/2012   Procedure: ANTERIOR CERVICAL DECOMPRESSION/DISCECTOMY FUSION 2 LEVELS;  Surgeon: Emilee Hero, MD;  Location: [facility] OR;  Service: Orthopedics;  Laterality: Bilateral;  Anterior cervical decompression fusion, cervical 5-6, cervical 6-7 with instrumentation and allograft.  . ASD REPAIR    . BLADDER SUSPENSION  2001  . cardiac and vascular implants and grafts    . CARDIAC CATHETERIZATION N/A 08/08/2014   Procedure: ASD Closure;  Surgeon: Tonny Bollman, MD;  Location:  [facility];  Service: Cardiovascular;  Laterality: N/A;  . PARTIAL HYSTERECTOMY    . sinus surgery repair    . TEE WITHOUT CARDIOVERSION N/A 07/15/2014   Procedure: TRANSESOPHAGEAL ECHOCARDIOGRAM (TEE);  Surgeon: Thurmon Fair, MD;  Location: [facility] ENDOSCOPY;  Service: Cardiovascular;  Laterality: N/A;  . TUBAL LIGATION    . tubel ligation       Current Outpatient Medications  Medication Sig Dispense Refill  . acetaminophen (TYLENOL) 325 MG tablet Take 650 mg by mouth every 6 (six) hours as needed for headache.    . albuterol (PROVENTIL HFA) 108 (90 Base) MCG/ACT inhaler Inhale 2 puffs into the lungs every 4 (four) hours as needed for  wheezing or shortness of breath. ProAir 18 g 12  . aspirin EC 81 MG tablet Take 81 mg by mouth daily.    [name] azithromycin (ZITHROMAX) 250 MG tablet 2 today then one daily 6 tablet 0  . benzonatate (TESSALON) 200 MG capsule Take 1 capsule (200 mg total) by mouth 3 (three) times daily as needed for cough. 30 capsule 12  . clonazePAM (KLONOPIN) 2 MG tablet Take 2 mg by mouth at bedtime. For restless legs  0  . fluticasone (FLONASE) 50 MCG/ACT nasal spray Place 2 sprays into both nostrils daily.    . Glycopyrrolate-Formoterol (BEVESPI AEROSPHERE) 9-4.8 MCG/ACT AERO Inhale 2 puffs into the lungs 2 (two) times daily. 3 Inhaler 0  . ipratropium-albuterol (DUONEB) 0.5-2.5 (3) MG/3ML SOLN Take 3 mLs by nebulization every 6 (six) hours. 75 mL 12  . montelukast (SINGULAIR) 10 MG tablet TAKE 1 TABLET BY MOUTH EVERY DAY 90 tablet 3  . Multiple Vitamin (MULTIVITAMIN WITH MINERALS) TABS tablet Take 1 tablet by mouth daily.    . Nebulizers (COMPRESSOR/NEBULIZER) MISC Use as directed 1 each 0  . venlafaxine (EFFEXOR) 75 MG tablet Take 75 mg by mouth 3 (three) times daily with meals.     No current facility-administered medications for this visit.     Allergies:   Penicillins; Cefuroxime; Excedrin tension headache [acetaminophen-caffeine]; Hydrocodone; Vicodin [hydrocodone-acetaminophen]; and Codeine    Social History:  The patient  reports that she quit smoking about 15 years ago. Her smoking use included cigarettes. She has a 50.00 pack-year smoking history. She has never used smokeless tobacco. She reports that she does not drink alcohol or use drugs.   Family History:  The patient's family history includes Allergies in her mother; Asthma in her mother; Breast cancer in her cousin; Congestive Heart Failure in her mother; Diabetes Mellitus I in her brother; Heart attack in her mother; Heart disease in her father; Hepatitis C in her brother; Lung cancer in her brother.    ROS:  Please see the history of present  illness.   Otherwise, review of systems are positive for exertional chest tightness.   All other systems are reviewed and negative.    PHYSICAL EXAM: VS:  BP (!) 138/94   Pulse 92   Ht 5' 6.5" (1.689 m)   Wt 182 lb 12.8 oz (82.9 kg)   SpO2 90%   BMI 29.06 kg/m  , BMI Body mass index is 29.06 kg/m. GEN: Well nourished, well developed, in no acute distress  HEENT: normal  Neck: no JVD, carotid bruits, or masses Cardiac: RRR; no murmurs, rubs, or gallops,no edema  Respiratory:  clear to auscultation bilaterally, normal work of breathing GI: soft, nontender, nondistended, + BS MS: no deformity or atrophy  Skin: warm and dry, no rash Neuro:  Strength and  sensation are intact Psych: euthymic mood, full affect   EKG:   The ekg ordered today demonstrates NSR, nonspecific ST changes   Recent Labs: 12/15/2017: BUN 22; Creatinine, Ser 0.88; Potassium 3.8; Sodium 142   Lipid Panel    Component Value Date/Time   CHOL 200 09/05/2015 0944   TRIG 221.0 (H) 09/05/2015 0944   HDL 37.10 (L) 09/05/2015 0944   CHOLHDL 5 09/05/2015 0944   VLDL 44.2 (H) 09/05/2015 0944   LDLDIRECT 125.0 09/05/2015 0944     Other studies Reviewed: Additional studies/ records that were reviewed today with results demonstrating: Dr. York Spaniel noted reviewed.  Chest CT reviewed.   ASSESSMENT AND PLAN:  1. Exertional chest discomfort concerning for angina.  She has several risk factors including family history and prior tobacco use.  We will plan for coronary CTA to better evaluate her anatomy.   2. Consider adding statin based on the CT result.  Last LDL was 125 several years ago.  She will need this rechecked.  Even if there is no severe CAD, may require statin based on percentile of calcium. 3. Status post ASD closure.  No evidence of heart failure.  No shunt evident on exam.   Current medicines are reviewed at length with the patient today.  The patient concerns regarding her medicines were addressed.   The following changes have been made:  No change  Labs/ tests ordered today include:  No orders of the defined types were placed in this encounter.   Recommend 150 minutes/week of aerobic exercise Low fat, low carb, high fiber diet recommended  Disposition:   FU in 1 year or sooner if there is a problem on the chest CT, or if symptoms persist   Signed, Lance Muss, MD  06/12/2018 3:02 PM    [facility] [address] [place], [place], [place]  [address] Phone: [phone]; Fax: [phone]

## 2018-06-12 NOTE — Addendum Note (Signed)
Addended by: Corky Crafts on: 06/12/2018 03:32 PM   Modules accepted: Level of Service

## 2018-06-19 ENCOUNTER — Telehealth: Payer: Self-pay | Admitting: Internal Medicine

## 2018-06-19 NOTE — Telephone Encounter (Signed)
Spoke with pt.  Pt has not heard anything about nebulizer machine.  Called Melissa at DME and she stated she would have to research it and call me back.  Will await a return call.

## 2018-06-19 NOTE — Telephone Encounter (Signed)
Spoke with Melissa and she stated the machine is out for delivery from [employer] and pt should receive it today.  Tried to call pt but mailbox was full.  Will try later.

## 2018-06-23 NOTE — Telephone Encounter (Signed)
Spoke with Melissa. She stated that the patient should be receiving her nebulizer machine today. It has been placed on the truck for delivery. They delivery person should be calling her soon.   ATC patient but she did not answer and her VM was full. Will keep this encounter open in case she calls back for an update.

## 2018-06-23 NOTE — Telephone Encounter (Signed)
[name] is calling back  [phone]

## 2018-06-23 NOTE — Telephone Encounter (Signed)
Called the patient and she still has not received the nebulizer machine. Advised her I will call [employer] to let them know.  LVM for Melissa at [employer] to let her know the machine has not been delivered and to please call us back with an update. Also requested she contact the patient and provide the tracking number with [employer] so the patient can call them directly if available.

## 2018-06-26 MED ORDER — IPRATROPIUM-ALBUTEROL 0.5-2.5 (3) MG/3ML IN SOLN: 3.0000 mL | Freq: Four times a day (QID) | RESPIRATORY_TRACT | 12 refills | Status: DC

## 2018-06-26 NOTE — Telephone Encounter (Signed)
Called and spoke with pt to see if she ever received the neb machine and pt stated that she had but did not have the neb sol to go with it. I looked to see where it was sent and it looks like when the order was placed for the neb machine, it also stated in there for pt to be provided the neb sol as well. Stated to pt that I would send the neb sol Rx to [facility] for her. Pt expressed understanding. Nothing further needed.

## 2018-06-29 MED ORDER — IPRATROPIUM-ALBUTEROL 0.5-2.5 (3) MG/3ML IN SOLN: 3.0000 mL | Freq: Four times a day (QID) | RESPIRATORY_TRACT | 12 refills | Status: DC | PRN

## 2018-06-29 NOTE — Telephone Encounter (Signed)
Received fax from [facility] pharmacy stating they are unable to provide neb medication for pt due to her insurance and advised to send script to her local pharmacy. Called patient to inform her of this. She expressed understanding and verified that it would be ok to send to [facility] on [address]. Order has been placed. Nothing further needed at this time.

## 2018-06-29 NOTE — Addendum Note (Signed)
Addended by: Sabra Heck D on: 06/29/2018 09:37 AM   Modules accepted: Orders

## 2018-08-04 ENCOUNTER — Ambulatory Visit: Payer: BLUE CROSS/BLUE SHIELD | Admitting: Interventional Cardiology

## 2018-09-15 ENCOUNTER — Encounter: Payer: Self-pay | Admitting: Acute Care

## 2018-09-16 ENCOUNTER — Other Ambulatory Visit: Payer: Self-pay

## 2018-09-16 ENCOUNTER — Other Ambulatory Visit: Payer: BC Managed Care – PPO | Admitting: *Deleted

## 2018-09-16 DIAGNOSIS — I7090 Unspecified atherosclerosis: Secondary | ICD-10-CM

## 2018-09-16 DIAGNOSIS — R079 Chest pain, unspecified: Secondary | ICD-10-CM

## 2018-09-16 DIAGNOSIS — Z01812 Encounter for preprocedural laboratory examination: Secondary | ICD-10-CM

## 2018-09-17 LAB — BASIC METABOLIC PANEL
BUN/Creatinine Ratio: 12 (ref 12–28)
BUN: 11 mg/dL (ref 8–27)
CO2: 24 mmol/L (ref 20–29)
Calcium: 9.3 mg/dL (ref 8.7–10.3)
Chloride: 101 mmol/L (ref 96–106)
Creatinine, Ser: 0.89 mg/dL (ref 0.57–1.00)
GFR calc Af Amer: 81 mL/min/{1.73_m2} (ref >59)
GFR calc non Af Amer: 71 mL/min/{1.73_m2} (ref >59)
Glucose: 121 mg/dL — ABNORMAL HIGH (ref 65–99)
Potassium: 4.2 mmol/L (ref 3.5–5.2)
Sodium: 142 mmol/L (ref 134–144)

## 2018-09-18 ENCOUNTER — Telehealth (HOSPITAL_COMMUNITY): Payer: Self-pay | Admitting: Emergency Medicine

## 2018-09-18 NOTE — Telephone Encounter (Signed)
Reaching out to patient to offer assistance regarding upcoming cardiac imaging study; pt verbalizes understanding of appt date/time, parking situation and where to check in, pre-test NPO status and medications ordered, and verified current allergies; name and call back number provided for further questions should they arise Halcyon Heck RN Navigator Cardiac Imaging [facility] [phone] office [phone] cell  Pt denies covid 

## 2018-09-21 ENCOUNTER — Other Ambulatory Visit: Payer: Self-pay

## 2018-09-21 ENCOUNTER — Ambulatory Visit (HOSPITAL_COMMUNITY): Payer: BC Managed Care – PPO

## 2018-09-21 ENCOUNTER — Ambulatory Visit (HOSPITAL_COMMUNITY)
Admission: RE | Admit: 2018-09-21 | Discharge: 2018-09-21 | Disposition: A | Discharge status: Home / Self Care | Payer: BC Managed Care – PPO | Source: Ambulatory Visit | Attending: Interventional Cardiology | Admitting: Interventional Cardiology

## 2018-09-21 DIAGNOSIS — R079 Chest pain, unspecified: Secondary | ICD-10-CM | POA: Diagnosis present

## 2018-09-21 DIAGNOSIS — I7090 Unspecified atherosclerosis: Secondary | ICD-10-CM

## 2018-09-21 MED ORDER — NITROGLYCERIN 0.4 MG SL SUBL
SUBLINGUAL_TABLET | SUBLINGUAL | Status: AC
  Filled 2018-09-21: qty 2

## 2018-09-21 MED ORDER — METOPROLOL TARTRATE 5 MG/5ML IV SOLN
5.0000 mg | INTRAVENOUS | Status: DC | PRN
  Filled 2018-09-21: qty 5

## 2018-09-21 MED ORDER — NITROGLYCERIN 0.4 MG SL SUBL
0.8000 mg | SUBLINGUAL_TABLET | SUBLINGUAL | Status: DC | PRN
  Administered 2018-09-21: 0.8 mg via SUBLINGUAL
  Filled 2018-09-21 (×2): qty 25

## 2018-09-21 MED ORDER — IOHEXOL 350 MG/ML SOLN
100.0000 mL | Freq: Once | INTRAVENOUS | Status: AC | PRN
  Administered 2018-09-21: 80 mL via INTRAVENOUS

## 2018-09-21 MED ORDER — ATORVASTATIN CALCIUM 10 MG PO TABS: 10.0000 mg | ORAL_TABLET | Freq: Every day | ORAL | 11 refills | Status: DC

## 2018-09-21 MED ORDER — METOPROLOL TARTRATE 5 MG/5ML IV SOLN
INTRAVENOUS | Status: AC
  Administered 2018-09-21: 5 mg
  Filled 2018-09-21: qty 20

## 2018-09-24 ENCOUNTER — Telehealth: Payer: Self-pay | Admitting: Internal Medicine

## 2018-09-24 NOTE — Telephone Encounter (Signed)
Error

## 2018-09-24 NOTE — Telephone Encounter (Signed)
Dr Arelia Sneddon reports patient in to see him today for UTI and noted to have asymptomatic O2 sat 87%. We agreed he would either have her back to recheck with him, or send her to [place].

## 2018-12-09 ENCOUNTER — Ambulatory Visit: Payer: Self-pay | Admitting: Internal Medicine

## 2019-04-28 IMAGING — CT CT MAXILLOFACIAL W/O CM
3 of 4 series · 14 of 47 positions shown, 16 images · non-contrast
Comparison: None.

CLINICAL DATA: Persistent sinusitis. Maxillary and eye pain and
pressure.

EXAM:
CT MAXILLOFACIAL WITHOUT CONTRAST
TECHNIQUE: Multidetector CT imaging of the maxillofacial structures was
performed. Multiplanar CT image reconstructions were also generated.

[Series 2: sinus 2.00 hr60 s3 ax · axial · 0.30mm/px · z∈[-663,-561]mm · 8 of 61 slices shown, 10 images]
[im 5/61  brain]
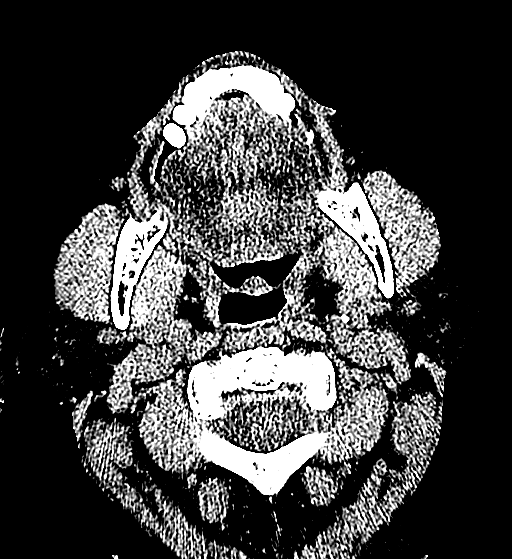
[im 5/61  bone]
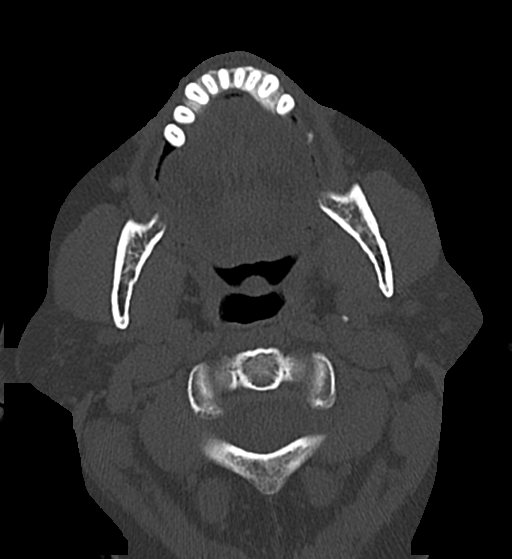
[im 13/61  bone]
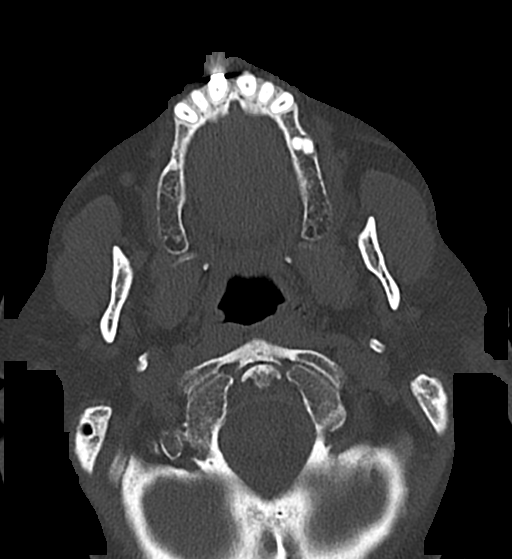
[im 22/61  bone]
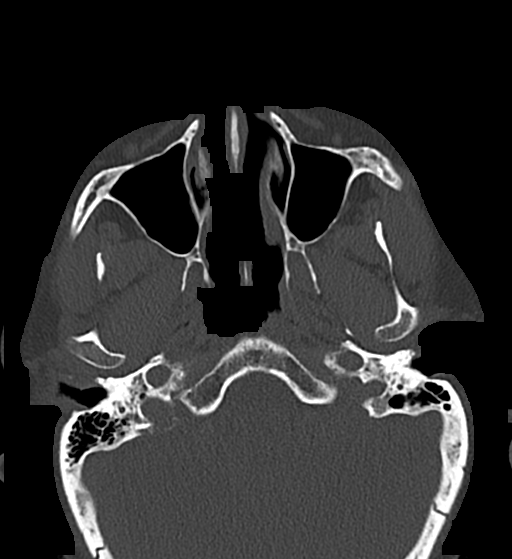
[im 26/61  bone]
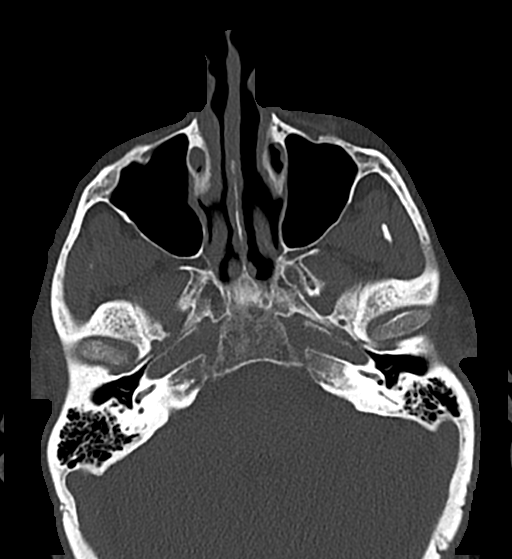
[im 35/61  brain]
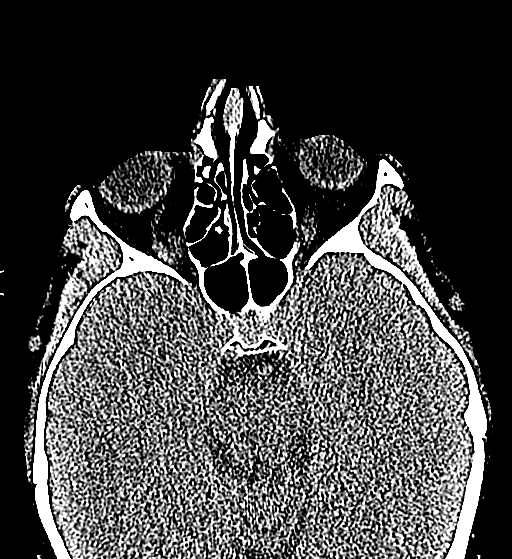
[im 35/61  bone]
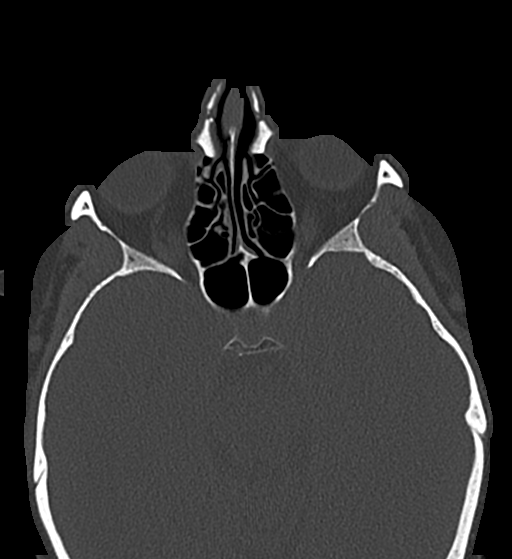
[im 39/61  bone]
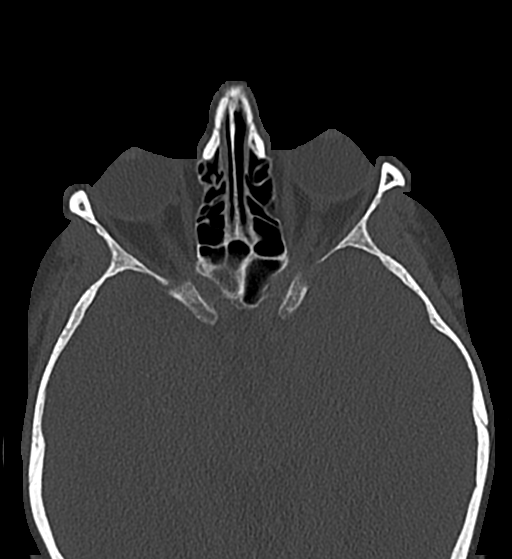
[im 48/61  bone]
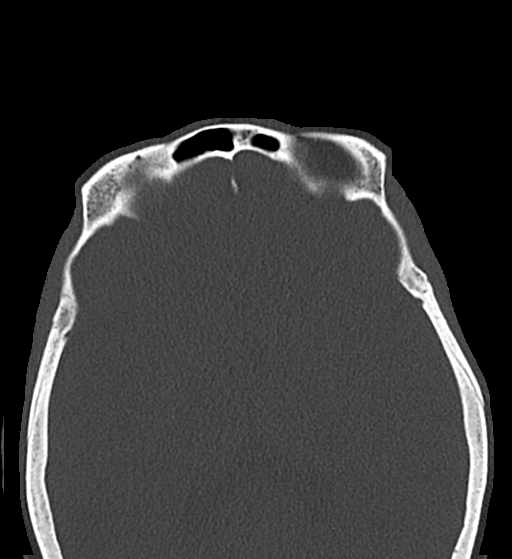
[im 56/61  bone]
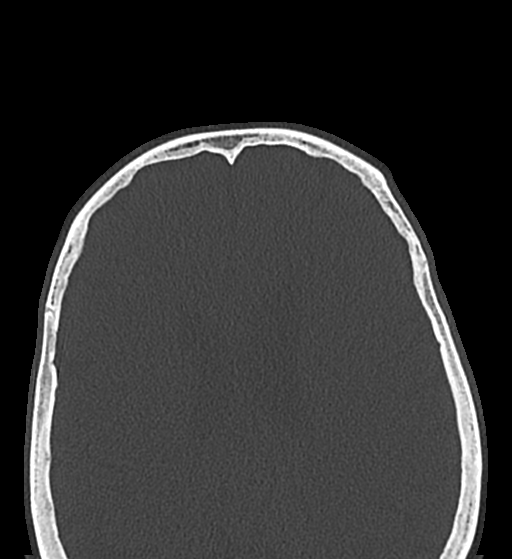

[Series 4: sinus 2.00 hr60 s3 cor · coronal · 0.24mm/px · 3 of 85 slices shown]
[im 29/85  bone]
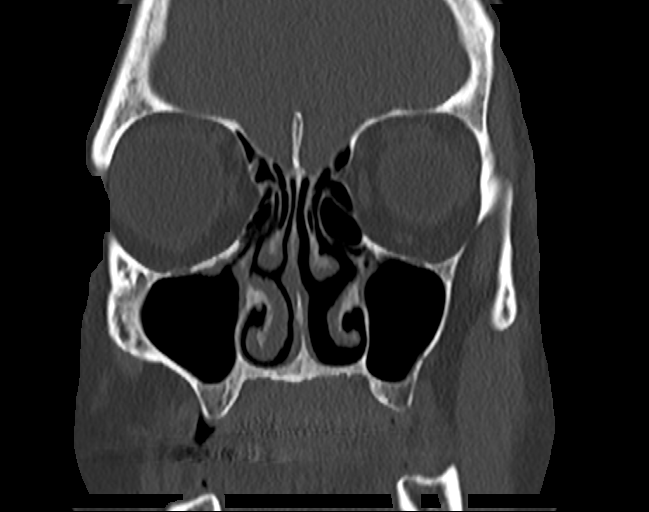
[im 38/85  bone]
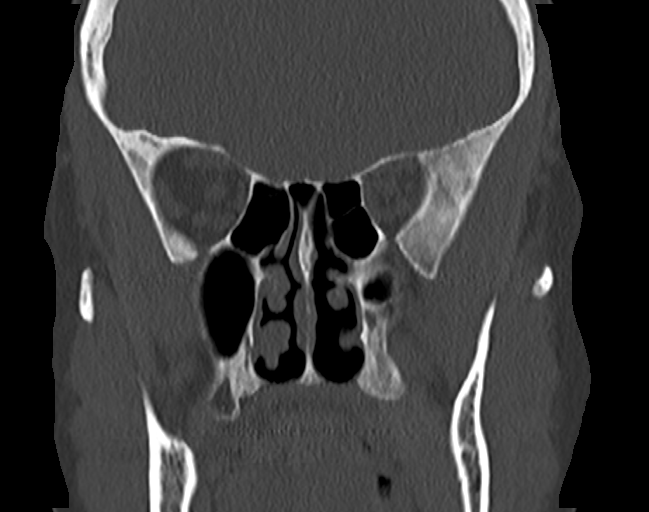
[im 47/85  bone]
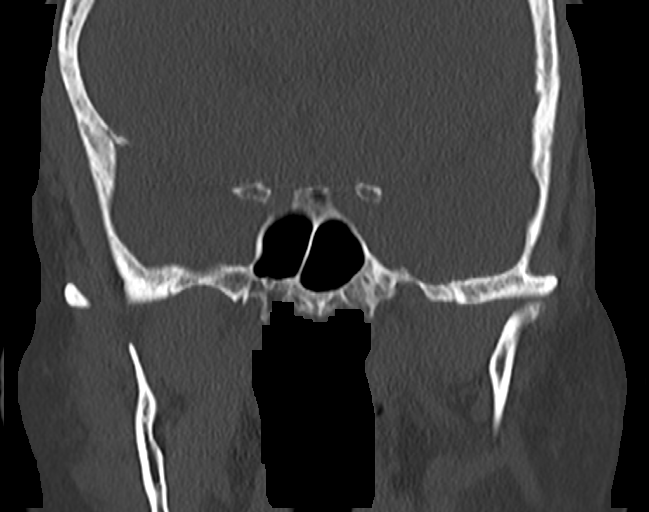

[Series 6: sinus 2.00 hr60 s3 sag · sagittal · 0.24mm/px · 3 of 78 slices shown]
[im 26/78  bone]
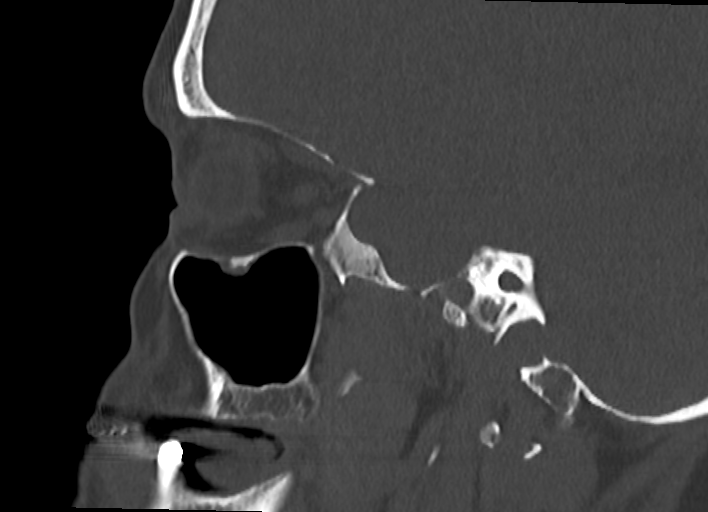
[im 39/78  bone]
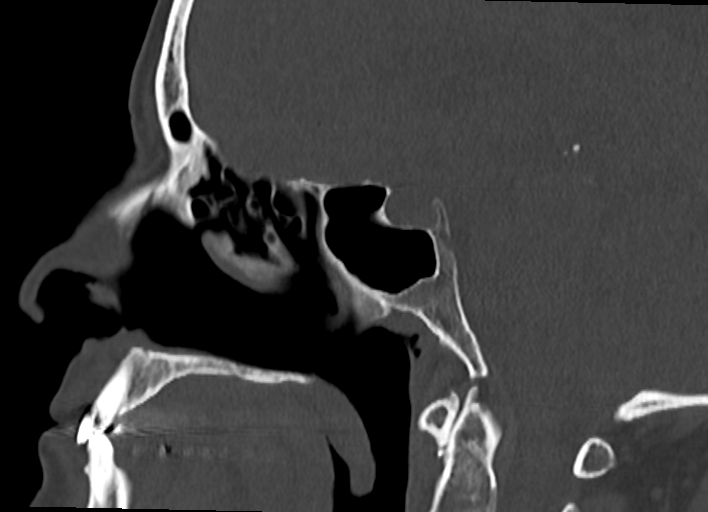
[im 52/78  bone]
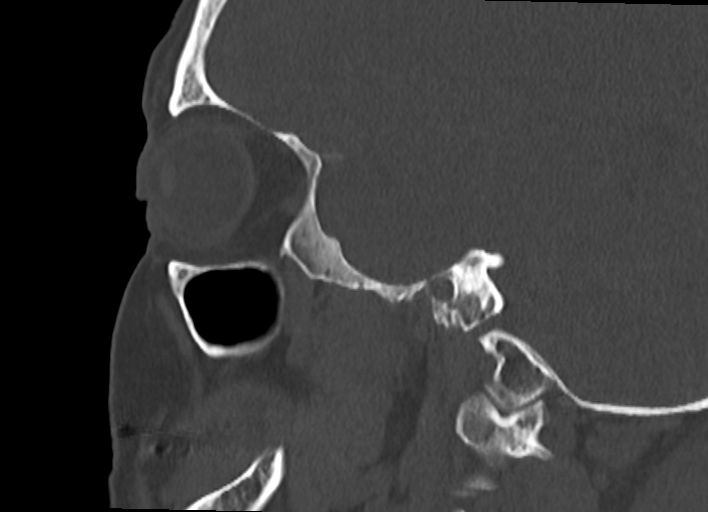

[14 of 47 positions shown; findings below may reference images not displayed]

FINDINGS: Osseous: No fracture or mandibular dislocation. No destructive
process.

Orbits: Negative. No traumatic or inflammatory finding.

Sinuses: Clear. No mucosal thickening or air-fluid levels. Mastoid
air cells clear.

Soft tissues: Negative

Limited intracranial: Negative
IMPRESSION: No evidence of sinusitis.

No acute findings.

## 2019-05-13 NOTE — Progress Notes (Signed)
Due to the COVID-19 crisis, this virtual check-in visit was done via telephone from my office and it was initiated and consent given by this patient and or family.   Telephone (Audio) Visit The purpose of this telephone visit is to provide medical care while limiting exposure to the novel coronavirus.    Consent was obtained for telephone visit and initiated by pt/family:  Yes.   Answered questions that patient had about telehealth interaction:  Yes.   I discussed the limitations, risks, security and privacy concerns of performing an evaluation and management service by telephone. I also discussed with the patient that there may be a patient responsible charge related to this service. The patient expressed understanding and agreed to proceed.  Pt location: Home Physician Location: office Name of referring provider:  Leonard Downing, * I connected with .Carney Harder at patients initiation/request on 05/17/2019 at  8:50 AM EST by telephone and verified that I am speaking with the correct person using two identifiers.  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  UPDATE: Headaches are well controlled. She reports low motivation and low energy.  Her PCP switched her into taking the venlafaxine all at night (maybe takes only two capsules). Intensity:  7-8/10 Duration: 1 to 2 hours with ibuprofen or Tylenol Frequency: 1 a month Current NSAIDS: ASA 81mg , ibuprofen Current analgesics: Tylenol  Current triptans: contraindicated Current anti-emetic: no Current muscle relaxants: no Current anti-anxiolytic: clonazepam 2mg  at bedtime Current sleep aide: clonazepam 2mg  Current Antihypertensive medications: no Current Antidepressant medications: Venlafaxine 75mg  in  after Current Anticonvulsant medications: no Current Vitamins/Herbal/Supplements: MVI Current Antihistamines/Decongestants: No  Caffeine: 1 cup coffee daily, tea Alcohol: no Smoker: no Diet: okay Exercise: no Depression/anxiety: She continues to have anxiety since stroke.  She continues to have memory deficits since the stroke.  HISTORY:  Onset: Since she had an embolic right frontal ischemic stroke in March 2016, which was demonstrated on MRI of brain. MRA showed occlusion of right M2 branch, felt to be embolic. She was found to have a PFO with atrial septal aneurysm, which was subsequently closed. EF was 55%. DVT was not found. She had been taking Plavix but now just ASA 81mg  daily. She is not on a statin. Headaches started soon after the stroke.  MRI of brain from 07/17/15, which was personally reviewed, again revealed remote right insular and parietal lobe infarcts but nothing acute.  Location: Bi-frontal She has a constant headache but fluctuates in intensity. When she has a severe headache: Quality: pounding Initial Intensity: 10/10 Aura: Only once she saw a bright light Prodrome: no Associated symptoms: None.  No associated nausea, vomiting, photophobia, phonophobia, visual disturbance or unilateral numbness or weakness. Initial Duration: 3 to 4 hours Initial Frequency: 3 to 4 days per week Triggers/aggravating factors: Bending over Relieving factors: Rubbing to her temples Activity: Lays down  Past NSAIDS: no Past analgesics: Excedrin (side effects) Past abortive triptans: contraindicated Past antihypertensive medications: no Past antidepressant medications: Bupropion, amitriptyline (side effects), Lexapro 20mg  Past anticonvulsant medications: topiramate Other past therapy: Botox (ineffective, made eyes heavy), CBD oil (ineffective) Past vitamins/Herbal/Supplements: no  Fears another stroke Sleep hygiene: good  She used to  work at a [employer] resources [employer]. She has been on disability since the stroke. Since her stroke, she has  had memory issues. She forgets things quickly. On rare occasions, she briefly is unsure where she is while driving but quickly re-orients herself. She has trouble with names, even people she has known a while, such as her neighbor. 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 She feels it has been unchanged since the stroke. Due to memory problems, she underwent neuropsychological testing on 02/15/16. She demonstrated impairment in sustained attention, processing speed, visual-spatial skills, and executive functioning, which is consistent with mild vascular cognitive impairment. She has history of ADHD and exhibits attention deficits on exam. She is currently not treated for ADHD.  On occasion, she reports dizziness described as sensation that she is going to pass out" with blackening of vision. It occurs on occasion if she is on her knees doing something. Has not had it recently.     Observations/Objective:   There were no vitals taken for this visit.  Assessment and Plan:   1.  Migraine without aura, without status migrainosus, not intractable 2.  Tension-type headache, not intractable 3.  Mild neurocognitive disorder 4.  History of embolic right MCA stroke status post PFO closure  1. Venlafaxine 75mg  three times daily for migraine prevention 2.  Limit use of pain relievers to no more than 2 days out of week to prevent risk of rebound or medication-overuse headache. 3.  Keep headache diary 4.  Secondary stroke prevention as per PCP:  ASA 81mg  daily; LDL goal less than 70; blood pressure and glycemic control 5.  Follow up in 1 year  Follow Up Instructions:    -I discussed the assessment and treatment plan with the patient. The patient was provided an opportunity to ask questions and all were answered. The patient agreed with the plan and  demonstrated an understanding of the instructions.   The patient was advised to call back or seek an in-person evaluation if the symptoms worsen or if the condition fails to improve as anticipated.    Total Time spent in visit with the patient was:  9 minutes  , DO

## 2019-05-17 ENCOUNTER — Other Ambulatory Visit: Payer: Self-pay

## 2019-05-17 ENCOUNTER — Telehealth (INDEPENDENT_AMBULATORY_CARE_PROVIDER_SITE_OTHER): Payer: BC Managed Care – PPO | Admitting: Neurology

## 2019-05-17 DIAGNOSIS — G43009 Migraine without aura, not intractable, without status migrainosus: Secondary | ICD-10-CM | POA: Diagnosis not present

## 2019-05-17 DIAGNOSIS — G44219 Episodic tension-type headache, not intractable: Secondary | ICD-10-CM | POA: Diagnosis not present

## 2019-05-17 DIAGNOSIS — Z8673 Personal history of transient ischemic attack (TIA), and cerebral infarction without residual deficits: Secondary | ICD-10-CM

## 2019-05-17 DIAGNOSIS — I63411 Cerebral infarction due to embolism of right middle cerebral artery: Secondary | ICD-10-CM

## 2019-08-06 ENCOUNTER — Telehealth: Payer: Self-pay | Admitting: Internal Medicine

## 2019-08-06 NOTE — Telephone Encounter (Signed)
Spoke with pt regarding lung cancer screening guidelines. Pt quit smoking 16 years ago so will no longer qualify for lung cancer screening. Pt verbalized understanding, Nothing further needed at this time.

## 2019-08-10 ENCOUNTER — Other Ambulatory Visit: Payer: Self-pay

## 2019-08-10 ENCOUNTER — Ambulatory Visit (INDEPENDENT_AMBULATORY_CARE_PROVIDER_SITE_OTHER): Payer: BC Managed Care – PPO | Admitting: Internal Medicine

## 2019-08-10 ENCOUNTER — Encounter: Payer: Self-pay | Admitting: Internal Medicine

## 2019-08-10 ENCOUNTER — Ambulatory Visit (INDEPENDENT_AMBULATORY_CARE_PROVIDER_SITE_OTHER): Payer: BC Managed Care – PPO

## 2019-08-10 VITALS — BP 120/82 | HR 85 | Temp 97.7°F | Ht 66.5 in | Wt 192.0 lb

## 2019-08-10 DIAGNOSIS — Q211 Atrial septal defect, unspecified: Secondary | ICD-10-CM

## 2019-08-10 DIAGNOSIS — J449 Chronic obstructive pulmonary disease, unspecified: Secondary | ICD-10-CM

## 2019-08-10 NOTE — Progress Notes (Signed)
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  FVC 1.62/45%, FEV1 1.12/40%, ratio 0.69, FEF 25-75% 0.69/27% ------------------------------------------------------------------------- 06/07/2018-  64 yo female former smoker followed for allergic rhinitis, asthma/COPD, complicated by embolic stroke RMCA/ ASD closed 2016 Enrolled in Lung Cancer Screening CT gram by Kandice Robinsons. Bevespi, Singulair, albuterol HFA, Tessalon ------pt states she has a cold, sinus congestion, cough w/ yellowish-green mucus, chest tightness, occasional SOB We discussed current episode in relationship to the sustained sinusitis she had last fall. Deep cough. Would like her own nebulizer. COPD exacerbations break-through control with current meds, especially with infections and seasonal pollens.  She can tolerate codeine cough syrup but it makes her drowsy. Tessalon is not strong enough to help her cough. Asks for depomedrol inj. Complains of "no energy" which she says she began noticing after her atrial septal procedure.  Her cardiologist has retired.  CT imaging has shown atherosclerosis.  I suggested it was time for [place] to get her a new cardiologist. CT max fac 12/25/17- No evidence of sinusitis. No acute findings.  08/10/19- 64 yo female former smoker followed for allergic rhinitis, asthma/COPD, complicated by embolic stroke RMCA/ ASD closed 2016 Enrolled in Lung Cancer Screening CT program by Kandice Robinsons. Bevespi, Singulair, albuterol HFA, Tessalon, neb Duoneb Had Cardiac CT last year - mild CAD -----f/u COPD mixed type.  Had 2 Covax Phizer Mentions got choked and strangled twice while eating. No recent need for nebulizer. Used rescue last  night. Occasional wheeze and cough, and some DOE. Clear mucus. No edema or chest pain.  Would like depo inj- big help last Spring.                   ROS-see HPI   + = positive Constitutional:   No-   weight loss, night sweats, fevers, chills, fatigue, lassitude. HEENT:   + headaches, difficulty swallowing, tooth/dental problems, sore throat,       No-  sneezing, itching, ear ache,  +nasal congestion, post nasal drip,  CV:  No-   chest pain, orthopnea, PND, swelling in lower extremities, anasarca, dizziness, palpitations Resp:   shortness of breath with exertion or at rest.                 +productive cough,  No non-productive cough,  No- coughing up of blood.              + change in color of mucus.   +wheezing.   Skin: No-   rash or lesions. GI:  No-   heartburn, indigestion, abdominal pain, nausea, vomiting, GU:  MS:  No-   joint pain or swelling.   Neuro-     nothing unusual Psych:  No- change in mood or affect. No depression or anxiety.  No memory loss.  OBJ- Physical Exam   General- Alert, Oriented, Affect-appropriate, Distress- none acute Skin- rash-none, lesions- none, excoriation- none Lymphadenopathy- none Head- atraumatic            Eyes- Gross vision intact, PERRLA, conjunctivae and secretions clear            Ears- Hearing, canals-normal            Nose- + turbinate edema, no-Septal dev, mucus, polyps, erosion, perforation  Throat- Mallampati II , mucosa cobblestoned , drainage- none, tonsils- atrophic,                                + hoarse Neck- flexible , trachea midline, no stridor , thyroid nl, carotid no bruit Chest - symmetrical excursion , unlabored           Heart/CV- RRR , no murmur , no gallop  , no rub, nl s1 s2                           - JVD- none , edema- none, stasis changes- none, varices- none           Lung- + diminished / coarse, wheeze- none, cough- none , dullness-none, rub- none           Chest wall-  Abd-  Br/ Gen/ Rectal- Not done,  not indicated Extrem- cyanosis- none, clubbing, none, atrophy- none, strength- nl Neuro- grossly intact to observation

## 2019-08-10 NOTE — Assessment & Plan Note (Signed)
Pending 1 year cardiology f/u. Still describes some exertional dyspnea and chest tightness without progression.

## 2019-08-10 NOTE — Assessment & Plan Note (Signed)
Variable bronchospasm, mildly worse during pollen season. Plan- sample trial Breztri, depomedrol, CXR

## 2019-08-10 NOTE — Patient Instructions (Addendum)
Order- CXR   Dx COPD mixed type  Order- Depo 80  Dx COPD mixed type   Sample x 2 Breztri inhaler    Inhale 2 puffs, then rinse mouth, twice daily Try this instead of Bevespi. If you like Breztri better, let us know and we can send a prescription.  Otherwise just go back to your Bevespi as before   Please call if we can help

## 2019-08-22 NOTE — Progress Notes (Signed)
Cardiology Office Note   Date:  08/23/2019   ID:  [name], DOB 03/03/1956, MRN [id number]  PCP:  Leonard Downing, MD    No chief complaint on file.  ASD  Wt Readings from Last 3 Encounters:  08/23/19 187 lb 1.9 oz (84.9 kg)  08/10/19 192 lb (87.1 kg)  06/12/18 182 lb 12.8 oz (82.9 kg)       History of Present Illness: [name] is a 64 y.o. female who had an ASD closure in 2017.  She reports fatigue since that time.    She had a lung cancer screening CT showing: Mild atherosclerotic calcifications of the aortic arch.  Mild coronary atherosclerosis in the LAD by CTA: "LM: Normal  LAD: 25-49% calcific proximal LAD stenosis  D1: Normal  D2: Normal  Circumflex: Normal  OM1: Normal  OM2: Normal  RCA: Normal  PDA: Normal  PLA: Normal  IMPRESSION: 1. Calcium score 52 isolated to proximal LAD This is 87 th percentile for age and sex  2.  CAD-RADS 2 24-49% non obstructive CAD in proximal LAD  3.  Normal aortic root diameter 3.2 cm"   She smoked for years and stopped in 2005.  She does not exercise regularly.  She feels some tightness in her chest with walking up stairs and it resolves with rest.   Brother with CABG at 22.   Since the last visit, she did get her COVID vaccines.  She has had some SHOB/DOE, and also has underlying lung disease.  Is not exercising regularly.    Tried atorvastatin but had leg pains/cramps that she attributed to the statin.  She is willing to try new medicine.       Past Medical History:  Diagnosis Date  . Acute maxillary sinusitis 12/08/2017  . ADHD (attention deficit hyperactivity disorder)   . ALLERGIC RHINITIS   . Asthma   . ASTHMA 02/27/2009   Qualifier: Diagnosis of  By: Quentin Cornwall CMA, Janett Billow    . Atrial septal defect    prior atrial septal defect stable following closure with amplatzer device  . 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 Claustrophobia    unable to ride in  elavator;leave room door open  . COPD (chronic obstructive pulmonary disease) ([place])   . 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 COPD with acute bronchitis ([place]) 03/27/2009   Qualifier: Diagnosis of  By: Annamaria Boots MD, Clinton D   . CVA (cerebral infarction) 07/14/2014  . Depression with anxiety 01/16/2015  . Fatigue    of uncertain etiology  . Former smoker 03/20/2017  . 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 History of frequent urinary tract infections   . Migraine headache    since childhood  . Numbness 07/14/2014  . Other headache syndrome 09/12/2014  . Overweight   . Patent foramen ovale 07/12/2014  . PONV (postoperative nausea and vomiting)    Pt states Zofran does not relieve sx; prefers Phenergan  . Post-menopause on HRT (hormone replacement therapy)   . Restless leg   . Seasonal and perennial allergic rhinitis 02/27/2009   Qualifier: Diagnosis of  By: Quentin Cornwall CMA, Janett Billow    . Stroke ([place])   . Stroke occurring within last month 07/12/2014  . Upper airway cough syndrome 04/29/2015   Trial of max gerd rx 04/28/2015 >>>      . URI 03/13/2009   Qualifier: Diagnosis of  By: Annamaria Boots MD, Tarri Fuller  D     Past Surgical History:  Procedure Laterality Date  . ABDOMINAL HYSTERECTOMY    . amplatzer septal occluder  07/2014  . ANTERIOR CERVICAL DECOMP/DISCECTOMY FUSION  05/06/2012   Procedure: ANTERIOR CERVICAL DECOMPRESSION/DISCECTOMY FUSION 2 LEVELS;  Surgeon: Emilee Hero, MD;  Location: [facility] OR;  Service: Orthopedics;  Laterality: Bilateral;  Anterior cervical decompression fusion, cervical 5-6, cervical 6-7 with instrumentation and allograft.  . ASD REPAIR    . BLADDER SUSPENSION  2001  . cardiac and vascular implants and grafts    . CARDIAC CATHETERIZATION N/A 08/08/2014   Procedure: ASD Closure;  Surgeon: Tonny Bollman, MD;  Location[facility] INVASIVE CV LAB;  Service: Cardiovascular;  Laterality:  N/A;  . PARTIAL HYSTERECTOMY    . sinus surgery repair    . TEE WITHOUT CARDIOVERSION N/A 07/15/2014   Procedure: TRANSESOPHAGEAL ECHOCARDIOGRAM (TEE);  Surgeon: Thurmon Fair, MD;  Location: [facility] ENDOSCOPY;  Service: Cardiovascular;  Laterality: N/A;  . TUBAL LIGATION    . tubel ligation       Current Outpatient Medications  Medication Sig Dispense Refill  . acetaminophen (TYLENOL) 325 MG tablet Take 650 mg by mouth every 6 (six) hours as needed for headache.    . albuterol (PROVENTIL HFA) 108 (90 Base) MCG/ACT inhaler Inhale 2 puffs into the lungs every 4 (four) hours as needed for wheezing or shortness of breath. ProAir 18 g 12  . aspirin EC 81 MG tablet Take 81 mg by mouth daily.    . benzonatate (TESSALON) 200 MG capsule Take 1 capsule (200 mg total) by mouth 3 (three) times daily as needed for cough. 30 capsule 12  . clonazePAM (KLONOPIN) 2 MG tablet Take 2 mg by mouth at bedtime. For restless legs  0  . fluticasone (FLONASE) 50 MCG/ACT nasal spray Place 2 sprays into both nostrils daily.    . Glycopyrrolate-Formoterol (BEVESPI AEROSPHERE) 9-4.8 MCG/ACT AERO Inhale 2 puffs into the lungs 2 (two) times daily. 3 Inhaler 0  . ipratropium-albuterol (DUONEB) 0.5-2.5 (3) MG/3ML SOLN Take 3 mLs by nebulization every 6 (six) hours as needed. 75 mL 12  . montelukast (SINGULAIR) 10 MG tablet TAKE 1 TABLET BY MOUTH EVERY DAY 90 tablet 3  . Multiple Vitamin (MULTIVITAMIN WITH MINERALS) TABS tablet Take 1 tablet by mouth daily.    . Nebulizers (COMPRESSOR/NEBULIZER) MISC Use as directed 1 each 0  . sulfamethoxazole-trimethoprim (BACTRIM DS) 800-160 MG tablet Take 1 tablet by mouth daily.    [name] venlafaxine (EFFEXOR) 75 MG tablet Take 75 mg by mouth 3 (three) times daily with meals.     No current facility-administered medications for this visit.    Allergies:   Penicillins, Cefuroxime, Excedrin tension headache [acetaminophen-caffeine], Hydrocodone, Vicodin [hydrocodone-acetaminophen], and Codeine      Social History:  The patient  reports that she quit smoking about 16 years ago. Her smoking use included cigarettes. She has a 50.00 pack-year smoking history. She has never used smokeless tobacco. She reports that she does not drink alcohol or use drugs.   Family History:  The patient's family history includes Allergies in her mother; Asthma in her mother; Breast cancer in her cousin; Congestive Heart Failure in her mother; Diabetes Mellitus I in her brother; Heart attack in her mother; Heart disease in her father; Hepatitis C in her brother; Lung cancer in her brother.    ROS:  Please see the history of present illness.   Otherwise, review of systems are positive for dyspnea on exertion.   All other  systems are reviewed and negative.    PHYSICAL EXAM: VS:  BP 118/78   Pulse 76   Ht 5' 6.5" (1.689 m)   Wt 187 lb 1.9 oz (84.9 kg)   SpO2 94%   BMI 29.75 kg/m  , BMI Body mass index is 29.75 kg/m. GEN: Well nourished, well developed, in no acute distress  HEENT: normal  Neck: no JVD, carotid bruits, or masses Cardiac: RRR; no murmurs, rubs, or gallops,no edema  Respiratory:  clear to auscultation bilaterally, normal work of breathing GI: soft, nontender, nondistended, + BS MS: no deformity or atrophy  Skin: warm and dry, no rash Neuro:  Strength and sensation are intact Psych: euthymic mood, full affect   EKG:   The ekg ordered today demonstrates normal sinus rhythm, IRBBB pattern, no ST segment changes   Recent Labs: 09/16/2018: BUN 11; Creatinine, Ser 0.89; Potassium 4.2; Sodium 142   Lipid Panel    Component Value Date/Time   CHOL 200 09/05/2015 0944   TRIG 221.0 (H) 09/05/2015 0944   HDL 37.10 (L) 09/05/2015 0944   CHOLHDL 5 09/05/2015 0944   VLDL 44.2 (H) 09/05/2015 0944   LDLDIRECT 125.0 09/05/2015 0944     Other studies Reviewed: Additional studies/ records that were reviewed today with results demonstrating: 2020 CT coronaries showing mild LAD disease; labs  from PMD reviewed.  Elevated blood glucose in June 2020.  Elevated cholesterol in June 2017.     ASSESSMENT AND PLAN:  1. Coronary artery calcification: Elevated calcium score with mild CAD. WiIl add Crestor 10 mg once a week, but will gradually build up the frequency of the dose.  LDL has been over 100 in the past.  We will recheck lipids today. 2. ASD: No sign of CHF. 3. Statin recommended given elevated calcium score.  Stressed the importance of preventive medication and therapy to prevent progression of her mild coronary artery disease.  Whole food, plant-based diet recommended.  Avoid processed foods. 4. Pre-DM: Per her report.  No recent labs. Check A1C 5. DOE/fatigue: We will also check TSH.    Current medicines are reviewed at length with the patient today.  The patient concerns regarding her medicines were addressed.  The following changes have been made:  No change  Labs/ tests ordered today include:  No orders of the defined types were placed in this encounter.   Recommend 150 minutes/week of aerobic exercise Low fat, low carb, high fiber diet recommended  Disposition:   FU in 1 year   Signed, Lance Muss, MD  08/23/2019 8:37 AM    [facility] [address] [place], [place], [place]  [address] Phone: [phone]; Fax: [phone]

## 2019-08-23 ENCOUNTER — Ambulatory Visit (INDEPENDENT_AMBULATORY_CARE_PROVIDER_SITE_OTHER): Payer: BC Managed Care – PPO | Admitting: Interventional Cardiology

## 2019-08-23 ENCOUNTER — Encounter: Payer: Self-pay | Admitting: Interventional Cardiology

## 2019-08-23 ENCOUNTER — Other Ambulatory Visit: Payer: Self-pay

## 2019-08-23 VITALS — BP 118/78 | HR 76 | Ht 66.5 in | Wt 187.1 lb

## 2019-08-23 DIAGNOSIS — Q211 Atrial septal defect, unspecified: Secondary | ICD-10-CM

## 2019-08-23 DIAGNOSIS — R0600 Dyspnea, unspecified: Secondary | ICD-10-CM

## 2019-08-23 DIAGNOSIS — I25118 Atherosclerotic heart disease of native coronary artery with other forms of angina pectoris: Secondary | ICD-10-CM | POA: Diagnosis not present

## 2019-08-23 DIAGNOSIS — R7303 Prediabetes: Secondary | ICD-10-CM | POA: Diagnosis not present

## 2019-08-23 DIAGNOSIS — R0609 Other forms of dyspnea: Secondary | ICD-10-CM

## 2019-08-23 DIAGNOSIS — R5382 Chronic fatigue, unspecified: Secondary | ICD-10-CM

## 2019-08-23 MED ORDER — ROSUVASTATIN CALCIUM 10 MG PO TABS: 10.0000 mg | ORAL_TABLET | Freq: Every day | ORAL | 3 refills | Status: DC

## 2019-08-23 NOTE — Patient Instructions (Signed)
Medication Instructions:  Your physician has recommended you make the following change in your medication:   START: rosuvastatin (crestor) 10 mg tablet: Take 1 tablet by mouth once a day  *If you need a refill on your cardiac medications before your next appointment, please call your pharmacy*   Lab Work: TODAY: CMET, LIPIDS, A1C, TSH  Your physician recommends that you return for a FASTING LIPIDS and LFTs in 4 months  If you have labs (blood work) drawn today and your tests are completely normal, you will receive your results only by: [name] MyChart Message (if you have MyChart) OR . A paper copy in the mail If you have any lab test that is abnormal or we need to change your treatment, we will call you to review the results.   Testing/Procedures: None ordered   Follow-Up: At [facility], you and your health needs are our priority.  As part of our continuing mission to provide you with exceptional heart care, we have created designated Provider Care Teams.  These Care Teams include your primary Cardiologist (physician) and Advanced Practice Providers (APPs -  Physician Assistants and Nurse Practitioners) who all work together to provide you with the care you need, when you need it.  We recommend signing up for the patient portal called "MyChart".  Sign up information is provided on this After Visit Summary.  MyChart is used to connect with patients for Virtual Visits (Telemedicine).  Patients are able to view lab/test results, encounter notes, upcoming appointments, etc.  Non-urgent messages can be sent to your provider as well.   To learn more about what you can do with MyChart, go to [email or web address].    Your next appointment:   12 month(s)  The format for your next appointment:   In Person  Provider:   You may see Lance Muss, MD or one of the following Advanced Practice Providers on your designated Care Team:    Ronie Spies, PA-C  Jacolyn Reedy, PA-C    Other  Instructions None

## 2019-08-24 LAB — COMPREHENSIVE METABOLIC PANEL
ALT: 13 IU/L (ref 0–32)
AST: 19 IU/L (ref 0–40)
Albumin/Globulin Ratio: 1.6 (ref 1.2–2.2)
Albumin: 4.2 g/dL (ref 3.8–4.8)
Alkaline Phosphatase: 82 IU/L (ref 48–121)
BUN/Creatinine Ratio: 18 (ref 12–28)
BUN: 16 mg/dL (ref 8–27)
Bilirubin Total: 0.3 mg/dL (ref 0.0–1.2)
CO2: 26 mmol/L (ref 20–29)
Calcium: 9.3 mg/dL (ref 8.7–10.3)
Chloride: 101 mmol/L (ref 96–106)
Creatinine, Ser: 0.91 mg/dL (ref 0.57–1.00)
GFR calc Af Amer: 79 mL/min/{1.73_m2} (ref >59)
GFR calc non Af Amer: 68 mL/min/{1.73_m2} (ref >59)
Globulin, Total: 2.6 g/dL (ref 1.5–4.5)
Glucose: 119 mg/dL — ABNORMAL HIGH (ref 65–99)
Potassium: 4.5 mmol/L (ref 3.5–5.2)
Sodium: 142 mmol/L (ref 134–144)
Total Protein: 6.8 g/dL (ref 6.0–8.5)

## 2019-08-24 LAB — HEMOGLOBIN A1C
Est. average glucose Bld gHb Est-mCnc: 140 mg/dL
Hgb A1c MFr Bld: 6.5 % — ABNORMAL HIGH (ref 4.8–5.6)

## 2019-08-24 LAB — LIPID PANEL
Chol/HDL Ratio: 4.4 ratio (ref 0.0–4.4)
Cholesterol, Total: 193 mg/dL (ref 100–199)
HDL: 44 mg/dL (ref >39)
LDL Chol Calc (NIH): 131 mg/dL — ABNORMAL HIGH (ref 0–99)
Triglycerides: 100 mg/dL (ref 0–149)
VLDL Cholesterol Cal: 18 mg/dL (ref 5–40)

## 2019-08-24 LAB — TSH: TSH: 2.6 u[IU]/mL (ref 0.450–4.500)

## 2019-12-27 ENCOUNTER — Other Ambulatory Visit: Payer: BC Managed Care – PPO | Admitting: *Deleted

## 2019-12-27 ENCOUNTER — Other Ambulatory Visit: Payer: Self-pay

## 2019-12-27 DIAGNOSIS — I25118 Atherosclerotic heart disease of native coronary artery with other forms of angina pectoris: Secondary | ICD-10-CM

## 2019-12-27 LAB — HEPATIC FUNCTION PANEL
ALT: 19 IU/L (ref 0–32)
AST: 19 IU/L (ref 0–40)
Albumin: 4.2 g/dL (ref 3.8–4.8)
Alkaline Phosphatase: 81 IU/L (ref 44–121)
Bilirubin Total: 0.2 mg/dL (ref 0.0–1.2)
Bilirubin, Direct: <0.1 mg/dL (ref 0.00–0.40)
Total Protein: 6.9 g/dL (ref 6.0–8.5)

## 2019-12-27 LAB — LIPID PANEL
Chol/HDL Ratio: 6 ratio — ABNORMAL HIGH (ref 0.0–4.4)
Cholesterol, Total: 228 mg/dL — ABNORMAL HIGH (ref 100–199)
HDL: 38 mg/dL — ABNORMAL LOW (ref >39)
LDL Chol Calc (NIH): 125 mg/dL — ABNORMAL HIGH (ref 0–99)
Triglycerides: 367 mg/dL — ABNORMAL HIGH (ref 0–149)
VLDL Cholesterol Cal: 65 mg/dL — ABNORMAL HIGH (ref 5–40)

## 2019-12-29 ENCOUNTER — Other Ambulatory Visit: Payer: Self-pay

## 2019-12-29 DIAGNOSIS — E785 Hyperlipidemia, unspecified: Secondary | ICD-10-CM

## 2020-01-12 ENCOUNTER — Telehealth: Payer: Self-pay | Admitting: Interventional Cardiology

## 2020-01-12 NOTE — Telephone Encounter (Signed)
Amy from [facility] is calling requesting the patient's most recent labs from 12/27/19 be faxed over to their office. Their fax number is [phone]. Please advise.

## 2020-01-14 NOTE — Telephone Encounter (Signed)
Most recent labs faxed over 01/14/20

## 2020-02-29 ENCOUNTER — Telehealth: Payer: Self-pay | Admitting: Internal Medicine

## 2020-02-29 MED ORDER — AZITHROMYCIN 250 MG PO TABS: 250.0000 mg | ORAL_TABLET | Freq: Every day | ORAL | 0 refills | Status: DC

## 2020-02-29 NOTE — Telephone Encounter (Addendum)
Called and spoke to pt. Pt states she had sinus congestion and fluid in both ears that began 2-3 weeks ago. Pt states she saw her PCP and was advised to use OTC meds and was not scripted anything. Pt denied headache, f/c/s, and sinus pressure at the time her s/s began. Pt states she feels the infection has migrated to her chest. Pt c/o cough with little mucu production, green in color, chest congestion, slight worsening in SOB x 1 -2 days. Pt denies f/c/s, CP/tightness, swelling. Pt states she has not been around anyone with COVID that she knows of. Pt is fully vaccinated. Pt last seen in 07/2019.  Dr. Maple Hudson please advise. Thanks.   Allergies  Allergen Reactions  . Penicillins Swelling  . Cefuroxime Swelling  . Excedrin Tension Headache [Acetaminophen-Caffeine] Swelling  . Hydrocodone Nausea Only  . Vicodin [Hydrocodone-Acetaminophen]   . Codeine Nausea Only    Current Outpatient Medications on File Prior to Visit  Medication Sig Dispense Refill  . acetaminophen (TYLENOL) 325 MG tablet Take 650 mg by mouth every 6 (six) hours as needed for headache.    . albuterol (PROVENTIL HFA) 108 (90 Base) MCG/ACT inhaler Inhale 2 puffs into the lungs every 4 (four) hours as needed for wheezing or shortness of breath. ProAir 18 g 12  . aspirin EC 81 MG tablet Take 81 mg by mouth daily.    . benzonatate (TESSALON) 200 MG capsule Take 1 capsule (200 mg total) by mouth 3 (three) times daily as needed for cough. 30 capsule 12  . clonazePAM (KLONOPIN) 2 MG tablet Take 2 mg by mouth at bedtime. For restless legs  0  . fluticasone (FLONASE) 50 MCG/ACT nasal spray Place 2 sprays into both nostrils daily.    . Glycopyrrolate-Formoterol (BEVESPI AEROSPHERE) 9-4.8 MCG/ACT AERO Inhale 2 puffs into the lungs 2 (two) times daily. 3 Inhaler 0  . ipratropium-albuterol (DUONEB) 0.5-2.5 (3) MG/3ML SOLN Take 3 mLs by nebulization every 6 (six) hours as needed. 75 mL 12  . montelukast (SINGULAIR) 10 MG tablet TAKE 1 TABLET BY  MOUTH EVERY DAY 90 tablet 3  . Multiple Vitamin (MULTIVITAMIN WITH MINERALS) TABS tablet Take 1 tablet by mouth daily.    . Nebulizers (COMPRESSOR/NEBULIZER) MISC Use as directed 1 each 0  . rosuvastatin (CRESTOR) 10 MG tablet Take 1 tablet (10 mg total) by mouth daily. 90 tablet 3  . sulfamethoxazole-trimethoprim (BACTRIM DS) 800-160 MG tablet Take 1 tablet by mouth daily.    [name] venlafaxine (EFFEXOR) 75 MG tablet Take 75 mg by mouth 3 (three) times daily with meals.     No current facility-administered medications on file prior to visit.

## 2020-02-29 NOTE — Telephone Encounter (Signed)
Called and spoke with patient, advised of Dr. Roxy Cedar recommendations.  She verbalized understanding.  Verified pharmacy and script sent.  Nothing further needed.

## 2020-02-29 NOTE — Telephone Encounter (Signed)
Please offer Zpak 250 mg, # 6   2 today then one daily.   Let us know if this doesn't take care of it.

## 2020-03-29 ENCOUNTER — Other Ambulatory Visit: Payer: BC Managed Care – PPO

## 2020-04-03 ENCOUNTER — Telehealth: Payer: Self-pay | Admitting: Internal Medicine

## 2020-04-03 MED ORDER — AZITHROMYCIN 250 MG PO TABS: 250.0000 mg | ORAL_TABLET | ORAL | 0 refills | Status: DC

## 2020-04-03 MED ORDER — PREDNISONE 10 MG PO TABS: ORAL_TABLET | ORAL | 0 refills | Status: DC

## 2020-04-03 NOTE — Telephone Encounter (Signed)
Pt aware of response per Dr Maple Hudson  She verbalized understanding and rxs sent

## 2020-04-03 NOTE — Telephone Encounter (Signed)
Spoke with pt, states that she started experiencing covid symptoms 1/27, tested positive on 1/1.  Pt's PCP had suggested she call the infusion clinic for monoclonal antibody infusion.  Pt called the MAB infusion clinic at [facility] and was advised that she wasn't eligible for the infusion. Pt is not vaccinated.   C/o sore throat, headaches, fatigue, body aches, loss of appetite/taste.  Pt denies fever, chest tightness, mucus production.  Has been using albuterol infrequently to help with dyspnea, bevespi as prescribed.  Requesting additional recs.   Pharmacy: [facility] [address]   CY please advise on recs.  Thanks!

## 2020-04-03 NOTE — Telephone Encounter (Signed)
There is real shortage of antibody therapy- only the sickest are getting it. Offer : 1) Zpak 250 mg, # 6   2 today then one daily  2) Prednisone 10 mg, # 20 4 X 2 DAYS, 3 X 2 DAYS, 2 X 2 DAYS, 1 X 2 DAYS

## 2020-04-13 ENCOUNTER — Other Ambulatory Visit: Payer: BC Managed Care – PPO

## 2020-05-15 NOTE — Progress Notes (Deleted)
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  UPDATE: Headaches are well controlled. She reports low motivation and low energy.  Her PCP switched her into taking the venlafaxine all at night (maybe takes only two capsules). Intensity: 7-8/10 Duration: 1 to 2 hours with ibuprofen or Tylenol Frequency:1 a month Current NSAIDS: ASA 81mg , ibuprofen Current analgesics: Tylenol  Current triptans: contraindicated Current anti-emetic: no Current muscle relaxants: no Current anti-anxiolytic: clonazepam 2mg  at bedtime Current sleep aide: clonazepam 2mg  Current Antihypertensive medications: no Current Antidepressant medications: Venlafaxine 75mg  in after Current Anticonvulsant medications: no Current Vitamins/Herbal/Supplements: MVI Current Antihistamines/Decongestants: No  Caffeine: 1 cup coffee daily, tea Alcohol: no Smoker: no Diet: okay Exercise: no Depression/anxiety: She continues to have anxiety since stroke.  She continues to have memory deficits since the stroke.  HISTORY: Onset: Since she had an embolic right frontal ischemic stroke in March 2016, which was demonstrated on MRI of brain. MRA showed occlusion of right M2 branch, felt to be embolic. She was found to have a PFO with atrial septal aneurysm, which was subsequently closed. EF was 55%. DVT was not found. She had been taking Plavix but now just ASA 81mg  daily. She is not on a statin. Headaches started soon after the stroke.  MRI of brain from 07/17/15, which was personally reviewed, again revealed remote right insular and parietal lobe infarcts but nothing acute.  Location: Bi-frontal She has a constant headache but fluctuates in intensity. When she has a severe  headache: Quality: pounding Initial Intensity: 10/10 Aura: Only once she saw a bright light Prodrome: no Associated symptoms: None. No associated nausea, vomiting, photophobia, phonophobia, visual disturbance or unilateral numbness or weakness. Initial Duration: 3 to 4 hours Initial Frequency: 3 to 4 days per week Triggers/aggravating factors: Bending over Relieving factors: Rubbing to her temples Activity: Lays down  Past NSAIDS: no Past analgesics: Excedrin (side effects) Past abortive triptans: contraindicated Past antihypertensive medications: no Past antidepressant medications: Bupropion, amitriptyline (side effects), Lexapro 20mg  Past anticonvulsant medications: topiramate Other past therapy: Botox (ineffective, made eyes heavy), CBD oil (ineffective) Past vitamins/Herbal/Supplements: no  Fears another stroke Sleep hygiene: good  She used to work at a . She has been on disability since the stroke. Since her stroke, she has had memory issues. She forgets things quickly. On rare occasions, she briefly is unsure where she is while driving but quickly re-orients herself. She has trouble with names, even people she has known a while, such as her neighbor. 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 She feels it has been unchanged since the stroke. Due to memory problems, she underwent neuropsychological testing on 02/15/16. She demonstrated impairment in sustained attention, processing speed, visual-spatial skills, and executive functioning, which is consistent with mild vascular cognitive impairment. She has history of ADHD and exhibits attention deficits on exam. She is currently not treated for ADHD.  On occasion, she reports dizziness described as sensation that she is going to pass out" with blackening of vision. It occurs on occasion if she is on her knees doing something. Has not had it  recently.  PAST MEDICAL HISTORY: Past Medical History:  Diagnosis Date  . Acute maxillary sinusitis 12/08/2017  . ADHD (attention deficit hyperactivity disorder)   . ALLERGIC RHINITIS   .  Asthma   . ASTHMA 02/27/2009   Qualifier: Diagnosis of  By: Roxan Hockey CMA, Shanda Bumps    . Atrial septal defect    prior atrial septal defect stable following closure with amplatzer device  . 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 Claustrophobia    unable to ride in elavator;leave room door open  . COPD (chronic obstructive pulmonary disease) (HCC)   . 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 COPD with acute bronchitis (HCC) 03/27/2009   Qualifier: Diagnosis of  By: Maple Hudson MD, Clinton D   . CVA (cerebral infarction) 07/14/2014  . Depression with anxiety 01/16/2015  . Fatigue    of uncertain etiology  . Former smoker 03/20/2017  . 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 History of frequent urinary tract infections   . Migraine headache    since childhood  . Numbness 07/14/2014  . Other headache syndrome 09/12/2014  . Overweight   . Patent foramen ovale 07/12/2014  . PONV (postoperative nausea and vomiting)    Pt states Zofran does not relieve sx; prefers Phenergan  . Post-menopause on HRT (hormone replacement therapy)   . Restless leg   . Seasonal and perennial allergic rhinitis 02/27/2009   Qualifier: Diagnosis of  By: Roxan Hockey CMA, Shanda Bumps    . Stroke (HCC)   . Stroke occurring within last month 07/12/2014  . Upper airway cough syndrome 04/29/2015   Trial of max gerd rx 04/28/2015 >>>      . URI 03/13/2009   Qualifier: Diagnosis of  By: Maple Hudson MD, Rennis Chris     MEDICATIONS: Current Outpatient Medications on File Prior to Visit  Medication Sig Dispense Refill  . acetaminophen (TYLENOL) 325 MG tablet Take 650 mg by mouth every 6 (six) hours as needed for headache.    . albuterol (PROVENTIL HFA) 108 (90 Base) MCG/ACT  inhaler Inhale 2 puffs into the lungs every 4 (four) hours as needed for wheezing or shortness of breath. ProAir 18 g 12  . aspirin EC 81 MG tablet Take 81 mg by mouth daily.    [name] azithromycin (ZITHROMAX) 250 MG tablet Take 1 tablet (250 mg total) by mouth daily. 2 tablets today and then 1 tablet daily until gone. 6 tablet 0  . azithromycin (ZITHROMAX) 250 MG tablet Take 1 tablet (250 mg total) by mouth as directed. 6 tablet 0  . benzonatate (TESSALON) 200 MG capsule Take 1 capsule (200 mg total) by mouth 3 (three) times daily as needed for cough. 30 capsule 12  . clonazePAM (KLONOPIN) 2 MG tablet Take 2 mg by mouth at bedtime. For restless legs  0  . fluticasone (FLONASE) 50 MCG/ACT nasal spray Place 2 sprays into both nostrils daily.    . Glycopyrrolate-Formoterol (BEVESPI AEROSPHERE) 9-4.8 MCG/ACT AERO Inhale 2 puffs into the lungs 2 (two) times daily. 3 Inhaler 0  . ipratropium-albuterol (DUONEB) 0.5-2.5 (3) MG/3ML SOLN Take 3 mLs by nebulization every 6 (six) hours as needed. 75 mL 12  . montelukast (SINGULAIR) 10 MG tablet TAKE 1 TABLET BY MOUTH EVERY DAY 90 tablet 3  . Multiple Vitamin (MULTIVITAMIN WITH MINERALS) TABS tablet Take 1 tablet by mouth daily.    . Nebulizers (COMPRESSOR/NEBULIZER) MISC Use as directed 1 each 0  . predniSONE (DELTASONE) 10 MG tablet 4 x 2 days, 3 x 2 days, 2 x 2 days, 1 x 2 days 20 tablet 0  . rosuvastatin (CRESTOR) 10 MG tablet Take 1 tablet (10  mg total) by mouth daily. 90 tablet 3  . sulfamethoxazole-trimethoprim (BACTRIM DS) 800-160 MG tablet Take 1 tablet by mouth daily.    [name] venlafaxine (EFFEXOR) 75 MG tablet Take 75 mg by mouth 3 (three) times daily with meals.     No current facility-administered medications on file prior to visit.    ALLERGIES: Allergies  Allergen Reactions  . Penicillins Swelling  . Cefuroxime Swelling  . Excedrin Tension Headache [Acetaminophen-Caffeine] Swelling  . Hydrocodone Nausea Only  . Vicodin  [Hydrocodone-Acetaminophen]   . Codeine Nausea Only    FAMILY HISTORY: Family History  Problem Relation Age of Onset  . Lung cancer Brother   . Asthma Mother   . Allergies Mother   . Heart attack Mother   . Congestive Heart Failure Mother   . Heart disease Father        cause of death  . Breast cancer Cousin   . Hepatitis C Brother        viral  . Diabetes Mellitus I Brother     SOCIAL HISTORY: Social History   Socioeconomic History  . Marital status: Married    Spouse name: Not on file  . Number of children: 3  . Years of education: Not on file  . Highest education level: Not on file  Occupational History  . Occupation: Advertising account planner: [employer]  Tobacco Use  . Smoking status: Former Smoker    Packs/day: 2.00    Years: 25.00    Pack years: 50.00    Types: Cigarettes    Quit date: 04/02/2003    Years since quitting: 17.1  . Smokeless tobacco: Never Used  Vaping Use  . Vaping Use: Never used  Substance and Sexual Activity  . Alcohol use: No  . Drug use: No  . Sexual activity: Yes  Other Topics Concern  . Not on file  Social History Narrative  . Not on file   Social Determinants of Health   Financial Resource Strain: Not on file  Food Insecurity: Not on file  Transportation Needs: Not on file  Physical Activity: Not on file  Stress: Not on file  Social Connections: Not on file  Intimate Partner Violence: Not on file     Objective:  *** General: No acute distress.  Patient appears well-groomed.   Head:  Normocephalic/atraumatic Eyes:  Fundi examined but not visualized Neck: supple, no paraspinal tenderness, full range of motion Heart:  Regular rate and rhythm Lungs:  Clear to auscultation bilaterally Back: No paraspinal tenderness Neurological Exam: alert and oriented to person, place, and time. Attention span and concentration intact, recent and remote memory intact, fund of knowledge intact.  Speech fluent and not dysarthric,  language intact.  CN II-XII intact. Bulk and tone normal, muscle strength 5/5 throughout.  Sensation to light touch, temperature and vibration intact.  Deep tendon reflexes 2+ throughout, toes downgoing.  Finger to nose and heel to shin testing intact.  Gait normal, Romberg negative.   Assessment/Plan:   1.  Migraine without aura, without status migrainosus, not intractable 2.  Tension-type headache, not intractable 3.  Mild neurocognitive disorder 4.  History of embolic right MCA infarct s/p PFO closure  1.  Migraine prevention:  Venlafaxine 75mg  TID 2.  Limit use of pain relievers to no more than 2 days out of week to prevent risk of rebound or medication-overuse headache. 3.  Keep headache diary 4.  Secondary stroke prevention as per PCP:  ASA 81mg  daily; LDL  goal less than 70; blood pressure and glycemic control 5.  Follow up in 1 year   Shon MilletAdam Hoa Deriso, DO  CC: Windle GuardWilson Elkins, MD

## 2020-05-16 ENCOUNTER — Ambulatory Visit: Payer: BC Managed Care – PPO | Admitting: Neurology

## 2020-05-22 ENCOUNTER — Telehealth: Payer: Self-pay | Admitting: Interventional Cardiology

## 2020-05-22 NOTE — Telephone Encounter (Signed)
I spoke with patient. She reports right sided chest pain Saturday morning after using the bathroom.  Radiated to under right arm and up the right side of her neck.  EMS was called and patient reports EKG looked OK.  She was advised to follow up with cardiology. She had no arm pain on Sunday. Today she is not having any arm pain but does have gas feeling in the middle of her chest. Burping helps this. Has had in the past and drinking Dr Reino Kent helps.  She does not have this at home so has not tried.  She reports gas feeling has decreased some this morning.  I scheduled patient to see Dr Eldridge Dace on 05/25/20 at 1:40.  I also advised her to follow up with PCP regarding belching/gas

## 2020-05-22 NOTE — Telephone Encounter (Signed)
Pt c/o of Chest Pain: STAT if CP now or developed within 24 hours  1. Are you having CP right now? Yes   2. Are you experiencing any other symptoms (ex. SOB, nausea, vomiting, sweating)? Pt denies none of the symptoms   3. How long have you been experiencing CP? Saturday morning   4. Is your CP continuous or coming and going? Continuous   5. Have you taken Nitroglycerin? No    Pt stated that she called ems Saturday, they came out and did a EKG and it appeared normal.  She stated it is not has bad today as it was Saturday.  She feel like it indigestion today.  The EMS told her she neeed to fu with DR this week.  There are no appts available   Best number [phone]   ?

## 2020-05-24 NOTE — Progress Notes (Unsigned)
Cardiology Office Note   Date:  05/25/2020   ID:  [name] [birth year], MRN [id number]  PCP:  Kaleen Mask, MD    No chief complaint on file.  Chest pain  Wt Readings from Last 3 Encounters:  05/25/20 195 lb 12.8 oz (88.8 kg)  08/23/19 187 lb 1.9 oz (84.9 kg)  08/10/19 192 lb (87.1 kg)       History of Present Illness: [name] is a 65 y.o. female   who had an ASD closure in 2017. She reports fatigue since that time.   She had a lung cancer screening CT showing: Mild atherosclerotic calcifications of the aortic arch.  Mild coronary atherosclerosis in the LAD by CTA in 2020: "LM: Normal  LAD: 25-49% calcific proximal LAD stenosis  D1: Normal  D2: Normal  Circumflex: Normal  OM1: Normal  OM2: Normal  RCA: Normal  PDA: Normal  PLA: Normal  IMPRESSION: 1. Calcium score 52 isolated to proximal LAD This is 11 th percentile for age and sex  2. CAD-RADS 2 24-49% non obstructive CAD in proximal LAD  3. Normal aortic root diameter 3.2 cm"   She smoked for years and stopped in 2005.  She does not exercise regularly. She feels some tightness in her chest with walking up stairs and it resolves with rest.   Brother with CABG at 62.  She did get her COVID vaccines.  She has had some SHOB/DOE, and also has underlying lung disease.  In 2/22, she had some right sided chest pain that radiated to her right shoulder and right arm. She thinks she may have reached with her right arm in a different way and strained.  She feels this is more like a muscle strain.    She is back to normal activities without symptoms.    Past Medical History:  Diagnosis Date  . Acute maxillary sinusitis 12/08/2017  . ADHD (attention deficit hyperactivity disorder)   . ALLERGIC RHINITIS   . Asthma   . ASTHMA 02/27/2009   Qualifier: Diagnosis of  By: Roxan Hockey CMA, Shanda Bumps    . Atrial septal defect    prior atrial septal defect  stable following closure with amplatzer device  . 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 Claustrophobia    unable to ride in elavator;leave room door open  . COPD (chronic obstructive pulmonary disease) (HCC)   . 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 COPD with acute bronchitis (HCC) 03/27/2009   Qualifier: Diagnosis of  By: Maple Hudson MD, Clinton D   . CVA (cerebral infarction) 07/14/2014  . Depression with anxiety 01/16/2015  . Fatigue    of uncertain etiology  . Former smoker 03/20/2017  . 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 History of frequent urinary tract infections   . Migraine headache    since childhood  . Numbness 07/14/2014  . Other headache syndrome 09/12/2014  . Overweight   . Patent foramen ovale 07/12/2014  . PONV (postoperative nausea and vomiting)    Pt states Zofran does not relieve sx; prefers Phenergan  . Post-menopause on HRT (hormone replacement therapy)   . Restless leg   . Seasonal and perennial allergic rhinitis 02/27/2009   Qualifier: Diagnosis of  By: Roxan Hockey CMA, Shanda Bumps    . Stroke (HCC)   . Stroke occurring within last month 07/12/2014  . Upper airway cough syndrome 04/29/2015   Trial of max gerd rx  04/28/2015 >>>      . URI 03/13/2009   Qualifier: Diagnosis of  By: Maple Hudson MD, Rennis Chris     Past Surgical History:  Procedure Laterality Date  . ABDOMINAL HYSTERECTOMY    . amplatzer septal occluder  07/2014  . ANTERIOR CERVICAL DECOMP/DISCECTOMY FUSION  05/06/2012   Procedure: ANTERIOR CERVICAL DECOMPRESSION/DISCECTOMY FUSION 2 LEVELS;  Surgeon: Emilee Hero, MD;  Location: [facility] OR;  Service: Orthopedics;  Laterality: Bilateral;  Anterior cervical decompression fusion, cervical 5-6, cervical 6-7 with instrumentation and allograft.  . ASD REPAIR    . BLADDER SUSPENSION  2001  . cardiac and vascular implants and grafts    . CARDIAC CATHETERIZATION N/A 08/08/2014    Procedure: ASD Closure;  Surgeon: Tonny Bollman, MD;  [facility] INVASIVE CV LAB;  Service: Cardiovascular;  Laterality: N/A;  . PARTIAL HYSTERECTOMY    . sinus surgery repair    . TEE WITHOUT CARDIOVERSION N/A 07/15/2014   Procedure: TRANSESOPHAGEAL ECHOCARDIOGRAM (TEE);  Surgeon: Thurmon Fair, MD;  Location: [facility] ENDOSCOPY;  Service: Cardiovascular;  Laterality: N/A;  . TUBAL LIGATION    . tubel ligation       Current Outpatient Medications  Medication Sig Dispense Refill  . acetaminophen (TYLENOL) 325 MG tablet Take 650 mg by mouth every 6 (six) hours as needed for headache.    . albuterol (PROVENTIL HFA) 108 (90 Base) MCG/ACT inhaler Inhale 2 puffs into the lungs every 4 (four) hours as needed for wheezing or shortness of breath. ProAir 18 g 12  . aspirin EC 81 MG tablet Take 81 mg by mouth daily.    . benzonatate (TESSALON) 200 MG capsule Take 1 capsule (200 mg total) by mouth 3 (three) times daily as needed for cough. 30 capsule 12  . clonazePAM (KLONOPIN) 2 MG tablet Take 2 mg by mouth at bedtime. For restless legs  0  . cloNIDine (CATAPRES) 0.1 MG tablet SMARTSIG:1-2 Tablet(s) By Mouth Every 12 Hours    . fluticasone (FLONASE) 50 MCG/ACT nasal spray Place 2 sprays into both nostrils daily.    . Glycopyrrolate-Formoterol (BEVESPI AEROSPHERE) 9-4.8 MCG/ACT AERO Inhale 2 puffs into the lungs 2 (two) times daily. 3 Inhaler 0  . ipratropium-albuterol (DUONEB) 0.5-2.5 (3) MG/3ML SOLN Take 3 mLs by nebulization every 6 (six) hours as needed. 75 mL 12  . montelukast (SINGULAIR) 10 MG tablet TAKE 1 TABLET BY MOUTH EVERY DAY 90 tablet 3  . Multiple Vitamin (MULTIVITAMIN WITH MINERALS) TABS tablet Take 1 tablet by mouth daily.    . Nebulizers (COMPRESSOR/NEBULIZER) MISC Use as directed 1 each 0  . rosuvastatin (CRESTOR) 10 MG tablet Take 1 tablet (10 mg total) by mouth daily. 90 tablet 3  . venlafaxine (EFFEXOR) 75 MG tablet Take 75 mg by mouth 3 (three) times daily with meals.     No  current facility-administered medications for this visit.    Allergies:   Penicillins, Cefuroxime, Excedrin tension headache [acetaminophen-caffeine], Hydrocodone, Vicodin [hydrocodone-acetaminophen], and Codeine    Social History:  The patient  reports that she quit smoking about 17 years ago. Her smoking use included cigarettes. She has a 50.00 pack-year smoking history. She has never used smokeless tobacco. She reports that she does not drink alcohol and does not use drugs.   Family History:  The patient's family history includes Allergies in her mother; Asthma in her mother; Breast cancer in her cousin; Congestive Heart Failure in her mother; Diabetes Mellitus I in her brother; Heart attack in her mother; Heart disease in  her father; Hepatitis C in her brother; Lung cancer in her brother.    ROS:  Please see the history of present illness.   Otherwise, review of systems are positive for prior right shoulder.   All other systems are reviewed and negative.    PHYSICAL EXAM: VS:  BP 120/88   Pulse 91   Ht 5' 6.5" (1.689 m)   Wt 195 lb 12.8 oz (88.8 kg)   SpO2 92%   BMI 31.13 kg/m  , BMI Body mass index is 31.13 kg/m. GEN: Well nourished, well developed, in no acute distress  HEENT: normal  Neck: no JVD, carotid bruits, or masses Cardiac: RRR; no murmurs, rubs, or gallops,no edema  Respiratory:  clear to auscultation bilaterally, normal work of breathing GI: soft, nontender, nondistended, + BS MS: no deformity or atrophy  Skin: warm and dry, no rash Neuro:  Strength and sensation are intact Psych: euthymic mood, full affect   EKG:   The ekg ordered today demonstrates NSR, no ST changes   Recent Labs: 08/23/2019: BUN 16; Creatinine, Ser 0.91; Potassium 4.5; Sodium 142; TSH 2.600 12/27/2019: ALT 19   Lipid Panel    Component Value Date/Time   CHOL 228 (H) 12/27/2019 0844   TRIG 367 (H) 12/27/2019 0844   HDL 38 (L) 12/27/2019 0844   CHOLHDL 6.0 (H) 12/27/2019 0844    CHOLHDL 5 09/05/2015 0944   VLDL 44.2 (H) 09/05/2015 0944   LDLCALC 125 (H) 12/27/2019 0844   LDLDIRECT 125.0 09/05/2015 0944     Other studies Reviewed: Additional studies/ records that were reviewed today with results demonstrating: labs reviewed.   ASSESSMENT AND PLAN:  1. CAD: Minimal by CT scan in 2020.  Atypical chest pain.  Improved.  No further cardiac testing at this time.  If sx worsen, could consider further testing.   2. Hyperlipidemia: LDL 125.  Continue rosuvastatin- tolerating well.   Intolerant of atorvastatin. WOuld like to see LDL < 100.  Managed by PMD.  3. DOE: Improved, but chronic sx of asthma.  4. PreDM: healthy diet.  Working on weight loss. Whole food, plant based diet recommended.  Avoid processed foods.    Current medicines are reviewed at length with the patient today.  The patient concerns regarding her medicines were addressed.  The following changes have been made:  No change  Labs/ tests ordered today include:  No orders of the defined types were placed in this encounter.   Recommend 150 minutes/week of aerobic exercise Low fat, low carb, high fiber diet recommended  Disposition:   FU in 1 year   Signed, Lance Muss, MD  05/25/2020 2:30 PM    [facility] [address] [place], [place], [place]  [address] Phone: [phone]; Fax: [phone]

## 2020-05-25 ENCOUNTER — Other Ambulatory Visit: Payer: Self-pay

## 2020-05-25 ENCOUNTER — Encounter: Payer: Self-pay | Admitting: Interventional Cardiology

## 2020-05-25 ENCOUNTER — Ambulatory Visit (INDEPENDENT_AMBULATORY_CARE_PROVIDER_SITE_OTHER): Payer: BC Managed Care – PPO | Admitting: Interventional Cardiology

## 2020-05-25 VITALS — BP 120/88 | HR 91 | Ht 66.5 in | Wt 195.8 lb

## 2020-05-25 DIAGNOSIS — R7303 Prediabetes: Secondary | ICD-10-CM

## 2020-05-25 DIAGNOSIS — E782 Mixed hyperlipidemia: Secondary | ICD-10-CM

## 2020-05-25 DIAGNOSIS — R0600 Dyspnea, unspecified: Secondary | ICD-10-CM | POA: Diagnosis not present

## 2020-05-25 DIAGNOSIS — I25118 Atherosclerotic heart disease of native coronary artery with other forms of angina pectoris: Secondary | ICD-10-CM

## 2020-05-25 DIAGNOSIS — R0609 Other forms of dyspnea: Secondary | ICD-10-CM

## 2020-05-25 NOTE — Patient Instructions (Signed)
Medication Instructions:  Your physician recommends that you continue on your current medications as directed. Please refer to the Current Medication list given to you today.  *If you need a refill on your cardiac medications before your next appointment, please call your pharmacy*   Lab Work: none If you have labs (blood work) drawn today and your tests are completely normal, you will receive your results only by: . MyChart Message (if you have MyChart) OR . A paper copy in the mail If you have any lab test that is abnormal or we need to change your treatment, we will call you to review the results.   Testing/Procedures: none   Follow-Up: At [facility], you and your health needs are our priority.  As part of our continuing mission to provide you with exceptional heart care, we have created designated Provider Care Teams.  These Care Teams include your primary Cardiologist (physician) and Advanced Practice Providers (APPs -  Physician Assistants and Nurse Practitioners) who all work together to provide you with the care you need, when you need it.  We recommend signing up for the patient portal called "MyChart".  Sign up information is provided on this After Visit Summary.  MyChart is used to connect with patients for Virtual Visits (Telemedicine).  Patients are able to view lab/test results, encounter notes, upcoming appointments, etc.  Non-urgent messages can be sent to your provider as well.   To learn more about what you can do with MyChart, go to [email or web address].    Your next appointment:   12 month(s)  The format for your next appointment:   In Person  Provider:   You may see Jayadeep Varanasi, MD or one of the following Advanced Practice Providers on your designated Care Team:    Dayna Dunn, PA-C  Michele Lenze, PA-C    Other Instructions  High-Fiber Eating Plan Fiber, also called dietary fiber, is a type of carbohydrate. It is found foods such as fruits,  vegetables, whole grains, and beans. A high-fiber diet can have many health benefits. Your health care provider may recommend a high-fiber diet to help:  Prevent constipation. Fiber can make your bowel movements more regular.  Lower your cholesterol.  Relieve the following conditions: ? Inflammation of veins in the anus (hemorrhoids). ? Inflammation of specific areas of the digestive tract (uncomplicated diverticulosis). ? A problem of the large intestine, also called the colon, that sometimes causes pain and diarrhea (irritable bowel syndrome, or IBS).  Prevent overeating as part of a weight-loss plan.  Prevent heart disease, type 2 diabetes, and certain cancers. What are tips for following this plan? Reading food labels  Check the nutrition facts label on food products for the amount of dietary fiber. Choose foods that have 5 grams of fiber or more per serving.  The goals for recommended daily fiber intake include: ? Men (age 50 or younger): 34-38 g. ? Men (over age 50): 28-34 g. ? Women (age 50 or younger): 25-28 g. ? Women (over age 50): 22-25 g. Your daily fiber goal is _____________ g.   Shopping  Choose whole fruits and vegetables instead of processed forms, such as apple juice or applesauce.  Choose a wide variety of high-fiber foods such as avocados, lentils, oats, and kidney beans.  Read the nutrition facts label of the foods you choose. Be aware of foods with added fiber. These foods often have high sugar and sodium amounts per serving. Cooking  Use whole-grain flour for baking and cooking.    Cook with brown rice instead of white rice. Meal planning  Start the day with a breakfast that is high in fiber, such as a cereal that contains 5 g of fiber or more per serving.  Eat breads and cereals that are made with whole-grain flour instead of refined flour or white flour.  Eat brown rice, bulgur wheat, or millet instead of white rice.  Use beans in place of meat in  soups, salads, and pasta dishes.  Be sure that half of the grains you eat each day are whole grains. General information  You can get the recommended daily intake of dietary fiber by: ? Eating a variety of fruits, vegetables, grains, nuts, and beans. ? Taking a fiber supplement if you are not able to take in enough fiber in your diet. It is better to get fiber through food than from a supplement.  Gradually increase how much fiber you consume. If you increase your intake of dietary fiber too quickly, you may have bloating, cramping, or gas.  Drink plenty of water to help you digest fiber.  Choose high-fiber snacks, such as berries, raw vegetables, nuts, and popcorn. What foods should I eat? Fruits Berries. Pears. Apples. Oranges. Avocado. Prunes and raisins. Dried figs. Vegetables Sweet potatoes. Spinach. Kale. Artichokes. Cabbage. Broccoli. Cauliflower. Green peas. Carrots. Squash. Grains Whole-grain breads. Multigrain cereal. Oats and oatmeal. Brown rice. Barley. Bulgur wheat. Millet. Quinoa. Bran muffins. Popcorn. Rye wafer crackers. Meats and other proteins Navy beans, kidney beans, and pinto beans. Soybeans. Split peas. Lentils. Nuts and seeds. Dairy Fiber-fortified yogurt. Beverages Fiber-fortified soy milk. Fiber-fortified orange juice. Other foods Fiber bars. The items listed above may not be a complete list of recommended foods and beverages. Contact a dietitian for more information. What foods should I avoid? Fruits Fruit juice. Cooked, strained fruit. Vegetables Fried potatoes. Canned vegetables. Well-cooked vegetables. Grains White bread. Pasta made with refined flour. White rice. Meats and other proteins Fatty cuts of meat. Fried chicken or fried fish. Dairy Milk. Yogurt. Cream cheese. Sour cream. Fats and oils Butters. Beverages Soft drinks. Other foods Cakes and pastries. The items listed above may not be a complete list of foods and beverages to avoid.  Talk with your dietitian about what choices are best for you. Summary  Fiber is a type of carbohydrate. It is found in foods such as fruits, vegetables, whole grains, and beans.  A high-fiber diet has many benefits. It can help to prevent constipation, lower blood cholesterol, aid weight loss, and reduce your risk of heart disease, diabetes, and certain cancers.  Increase your intake of fiber gradually. Increasing fiber too quickly may cause cramping, bloating, and gas. Drink plenty of water while you increase the amount of fiber you consume.  The best sources of fiber include whole fruits and vegetables, whole grains, nuts, seeds, and beans. This information is not intended to replace advice given to you by your health care provider. Make sure you discuss any questions you have with your health care provider. Document Revised: 07/22/2019 Document Reviewed: 07/22/2019 Elsevier Patient Education  2021 Elsevier Inc.   

## 2020-06-14 ENCOUNTER — Other Ambulatory Visit: Payer: Self-pay | Admitting: Obstetrics and Gynecology

## 2020-06-14 DIAGNOSIS — Z1231 Encounter for screening mammogram for malignant neoplasm of breast: Secondary | ICD-10-CM

## 2020-07-26 ENCOUNTER — Emergency Department (HOSPITAL_COMMUNITY)
Admission: EM | Admit: 2020-07-26 | Discharge: 2020-07-26 | Disposition: A | Discharge status: Home / Self Care | Payer: BC Managed Care – PPO | Attending: Emergency Medicine | Admitting: Emergency Medicine

## 2020-07-26 ENCOUNTER — Emergency Department (HOSPITAL_COMMUNITY): Payer: BC Managed Care – PPO

## 2020-07-26 DIAGNOSIS — J449 Chronic obstructive pulmonary disease, unspecified: Secondary | ICD-10-CM | POA: Insufficient documentation

## 2020-07-26 DIAGNOSIS — Z87891 Personal history of nicotine dependence: Secondary | ICD-10-CM | POA: Insufficient documentation

## 2020-07-26 DIAGNOSIS — R1110 Vomiting, unspecified: Secondary | ICD-10-CM | POA: Diagnosis not present

## 2020-07-26 DIAGNOSIS — R1031 Right lower quadrant pain: Secondary | ICD-10-CM | POA: Diagnosis not present

## 2020-07-26 DIAGNOSIS — Z7951 Long term (current) use of inhaled steroids: Secondary | ICD-10-CM | POA: Insufficient documentation

## 2020-07-26 DIAGNOSIS — Z7982 Long term (current) use of aspirin: Secondary | ICD-10-CM | POA: Insufficient documentation

## 2020-07-26 DIAGNOSIS — J45909 Unspecified asthma, uncomplicated: Secondary | ICD-10-CM | POA: Diagnosis not present

## 2020-07-26 DIAGNOSIS — R197 Diarrhea, unspecified: Secondary | ICD-10-CM | POA: Diagnosis not present

## 2020-07-26 LAB — CBC WITH DIFFERENTIAL/PLATELET
Abs Immature Granulocytes: 0.02 10*3/uL (ref 0.00–0.07)
Basophils Absolute: 0 10*3/uL (ref 0.0–0.1)
Basophils Relative: 1 %
Eosinophils Absolute: 0.4 10*3/uL (ref 0.0–0.5)
Eosinophils Relative: 5 %
HCT: 40.2 % (ref 36.0–46.0)
Hemoglobin: 12.7 g/dL (ref 12.0–15.0)
Immature Granulocytes: 0 %
Lymphocytes Relative: 26 %
Lymphs Abs: 1.7 10*3/uL (ref 0.7–4.0)
MCH: 30.2 pg (ref 26.0–34.0)
MCHC: 31.6 g/dL (ref 30.0–36.0)
MCV: 95.7 fL (ref 80.0–100.0)
Monocytes Absolute: 0.5 10*3/uL (ref 0.1–1.0)
Monocytes Relative: 8 %
Neutro Abs: 3.9 10*3/uL (ref 1.7–7.7)
Neutrophils Relative %: 60 %
Platelets: 233 10*3/uL (ref 150–400)
RBC: 4.2 MIL/uL (ref 3.87–5.11)
RDW: 13.2 % (ref 11.5–15.5)
WBC: 6.5 10*3/uL (ref 4.0–10.5)
nRBC: 0 % (ref 0.0–0.2)

## 2020-07-26 LAB — COMPREHENSIVE METABOLIC PANEL
ALT: 16 U/L (ref 0–44)
AST: 17 U/L (ref 15–41)
Albumin: 3.5 g/dL (ref 3.5–5.0)
Alkaline Phosphatase: 52 U/L (ref 38–126)
Anion gap: 7 (ref 5–15)
BUN: 6 mg/dL — ABNORMAL LOW (ref 8–23)
CO2: 31 mmol/L (ref 22–32)
Calcium: 8.6 mg/dL — ABNORMAL LOW (ref 8.9–10.3)
Chloride: 104 mmol/L (ref 98–111)
Creatinine, Ser: 0.75 mg/dL (ref 0.44–1.00)
GFR, Estimated: >60 mL/min (ref >60)
Glucose, Bld: 98 mg/dL (ref 70–99)
Potassium: 3.6 mmol/L (ref 3.5–5.1)
Sodium: 142 mmol/L (ref 135–145)
Total Bilirubin: 0.6 mg/dL (ref 0.3–1.2)
Total Protein: 6 g/dL — ABNORMAL LOW (ref 6.5–8.1)

## 2020-07-26 LAB — URINALYSIS, ROUTINE W REFLEX MICROSCOPIC
Bilirubin Urine: NEGATIVE
Glucose, UA: NEGATIVE mg/dL
Hgb urine dipstick: NEGATIVE
Ketones, ur: NEGATIVE mg/dL
Leukocytes,Ua: NEGATIVE
Nitrite: NEGATIVE
Protein, ur: NEGATIVE mg/dL
Specific Gravity, Urine: 1.003 — ABNORMAL LOW (ref 1.005–1.030)
pH: 7 (ref 5.0–8.0)

## 2020-07-26 LAB — LIPASE, BLOOD: Lipase: 38 U/L (ref 11–51)

## 2020-07-26 MED ORDER — IOHEXOL 300 MG/ML  SOLN
100.0000 mL | Freq: Once | INTRAMUSCULAR | Status: AC | PRN
  Administered 2020-07-26: 100 mL via INTRAVENOUS

## 2020-07-26 NOTE — ED Triage Notes (Signed)
Pt here from home with c/o rlq pain , off and on since sat , along with n/v

## 2020-07-26 NOTE — ED Triage Notes (Signed)
Emergency Medicine Provider Triage Evaluation Note  AVELEEN NEVERS , a 65 y.o. female  was evaluated in triage.  Pt complains of abdominal pain, onset left side on Friday with diarrhea, vomiting onset Saturday, now with right lower abdominal pain, radiates into back, low grade fevers.  Review of Systems  Positive: Abdominal pain, vomiting, diarrhea Negative: Urinary symptoms  Physical Exam  BP (!) 145/81   Pulse 72   Temp 98.1 F (36.7 C) (Oral)   Resp 16   SpO2 95%  Gen:   Awake, no distress   HEENT:  Atraumatic  Resp:  Normal effort  Cardiac:  Normal rate  Abd:   Nondistended, generalized tenderness  MSK:   Moves extremities without difficulty  Neuro:  Speech clear   Medical Decision Making  Medically screening exam initiated at 2:26 PM.  Appropriate orders placed.  Dalene Seltzer was informed that the remainder of the evaluation will be completed by another provider, this initial triage assessment does not replace that evaluation, and the importance of remaining in the ED until their evaluation is complete.  Clinical Impression     Jeannie Fend, PA-C 07/26/20 1429

## 2020-07-26 NOTE — ED Notes (Signed)
Patient verbalized understanding of discharge instructions. Opportunity for questions and answers.  

## 2020-07-26 NOTE — ED Provider Notes (Signed)
[facility] EMERGENCY DEPARTMENT Provider Note   CSN: [id number] Arrival date & time: 07/26/20  1320     History No chief complaint on file.   [name] is a 65 y.o. female.  Patient presents ER chief complaint abdominal pain.  Currently describes it is in the right lower quadrant for the past day.  Describes a sharp and achy.  Initially she had lower abdominal pain in the left side and across the middle and then gradually moved to the right side over the last 24 hours.  Otherwise she denies any fevers.  Has had no cough.  Positive vomiting positive diarrhea nonbloody.  Denies any travel or recent antibiotics.  She is called her primary care doctor who sent her to the ER to rule out appendicitis.        Past Medical History:  Diagnosis Date  . Acute maxillary sinusitis 12/08/2017  . ADHD (attention deficit hyperactivity disorder)   . ALLERGIC RHINITIS   . Asthma   . ASTHMA 02/27/2009   Qualifier: Diagnosis of  By: Roxan Hockey CMA, Shanda Bumps    . Atrial septal defect    prior atrial septal defect stable following closure with amplatzer device  . 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 Claustrophobia    unable to ride in elavator;leave room door open  . COPD (chronic obstructive pulmonary disease) (HCC)   . 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 COPD with acute bronchitis (HCC) 03/27/2009   Qualifier: Diagnosis of  By: Maple Hudson MD, Clinton D   . CVA (cerebral infarction) 07/14/2014  . Depression with anxiety 01/16/2015  . Fatigue    of uncertain etiology  . Former smoker 03/20/2017  . 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 History of frequent urinary tract infections   . Migraine headache    since childhood  . Numbness 07/14/2014  . Other headache syndrome 09/12/2014  . Overweight   . Patent foramen ovale 07/12/2014  . PONV (postoperative nausea and vomiting)    Pt states  Zofran does not relieve sx; prefers Phenergan  . Post-menopause on HRT (hormone replacement therapy)   . Restless leg   . Seasonal and perennial allergic rhinitis 02/27/2009   Qualifier: Diagnosis of  By: Roxan Hockey CMA, Shanda Bumps    . Stroke (HCC)   . Stroke occurring within last month 07/12/2014  . Upper airway cough syndrome 04/29/2015   Trial of max gerd rx 04/28/2015 >>>      . URI 03/13/2009   Qualifier: Diagnosis of  By: Maple Hudson MD, Clinton D     Patient Active Problem List   Diagnosis Date Noted  . Acute maxillary sinusitis 12/08/2017  . Former smoker 03/20/2017  . Upper airway cough syndrome 04/29/2015  . Depression with anxiety 01/16/2015  . Other headache syndrome 09/12/2014  . 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 Atrial septal defect   . CVA (cerebral infarction) 07/14/2014  . Numbness 07/14/2014  . 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 Patent foramen ovale 07/12/2014  . Stroke occurring within last month 07/12/2014  . 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 URI 03/13/2009  . Seasonal and perennial allergic rhinitis 02/27/2009    Past Surgical History:  Procedure Laterality Date  . ABDOMINAL HYSTERECTOMY    . amplatzer septal occluder  07/2014  . ANTERIOR CERVICAL DECOMP/DISCECTOMY FUSION  05/06/2012   Procedure: ANTERIOR CERVICAL DECOMPRESSION/DISCECTOMY FUSION 2 LEVELS;  Surgeon: Emilee Hero, MD;  Location: [facility]  OR;  Service: Orthopedics;  Laterality: Bilateral;  Anterior cervical decompression fusion, cervical 5-6, cervical 6-7 with instrumentation and allograft.  . ASD REPAIR    . BLADDER SUSPENSION  2001  . cardiac and vascular implants and grafts    . CARDIAC CATHETERIZATION N/A 08/08/2014   Procedure: ASD Closure;  Surgeon: Tonny Bollman, MD;  Location: [facility] INVASIVE CV LAB;  Service: Cardiovascular;  Laterality: N/A;  . PARTIAL HYSTERECTOMY    . sinus surgery repair    . TEE WITHOUT CARDIOVERSION N/A 07/15/2014   Procedure: TRANSESOPHAGEAL ECHOCARDIOGRAM (TEE);   Surgeon: Thurmon Fair, MD;  Location: [facility] ENDOSCOPY;  Service: Cardiovascular;  Laterality: N/A;  . TUBAL LIGATION    . tubel ligation       OB History   No obstetric history on file.     Family History  Problem Relation Age of Onset  . Lung cancer Brother   . Asthma Mother   . Allergies Mother   . Heart attack Mother   . Congestive Heart Failure Mother   . Heart disease Father        cause of death  . Breast cancer Cousin   . Hepatitis C Brother        viral  . Diabetes Mellitus I Brother     Social History   Tobacco Use  . Smoking status: Former Smoker    Packs/day: 2.00    Years: 25.00    Pack years: 50.00    Types: Cigarettes    Quit date: 04/02/2003    Years since quitting: 17.3  . Smokeless tobacco: Never Used  Vaping Use  . Vaping Use: Never used  Substance Use Topics  . Alcohol use: No  . Drug use: No    Home Medications Prior to Admission medications   Medication Sig Start Date End Date Taking? Authorizing Provider  acetaminophen (TYLENOL) 325 MG tablet Take 650 mg by mouth every 6 (six) hours as needed for headache.    [provider]  albuterol (PROVENTIL HFA) 108 (90 Base) MCG/ACT inhaler Inhale 2 puffs into the lungs every 4 (four) hours as needed for wheezing or shortness of breath. ProAir 11/18/16   Jetty Duhamel D, MD  aspirin EC 81 MG tablet Take 81 mg by mouth daily.    [provider]  benzonatate (TESSALON) 200 MG capsule Take 1 capsule (200 mg total) by mouth 3 (three) times daily as needed for cough. 12/08/17   Waymon Budge, MD  clonazePAM (KLONOPIN) 2 MG tablet Take 2 mg by mouth at bedtime. For restless legs 07/07/14   [provider]  cloNIDine (CATAPRES) 0.1 MG tablet SMARTSIG:1-2 Tablet(s) By Mouth Every 12 Hours 05/23/20   [provider]  fluticasone (FLONASE) 50 MCG/ACT nasal spray Place 2 sprays into both nostrils daily.    [provider]  Glycopyrrolate-Formoterol (BEVESPI AEROSPHERE)  9-4.8 MCG/ACT AERO Inhale 2 puffs into the lungs 2 (two) times daily. 03/20/17   Bevelyn Ngo, NP  ipratropium-albuterol (DUONEB) 0.5-2.5 (3) MG/3ML SOLN Take 3 mLs by nebulization every 6 (six) hours as needed. 06/29/18   Jetty Duhamel D, MD  montelukast (SINGULAIR) 10 MG tablet TAKE 1 TABLET BY MOUTH EVERY DAY 11/26/17   Jetty Duhamel D, MD  Multiple Vitamin (MULTIVITAMIN WITH MINERALS) TABS tablet Take 1 tablet by mouth daily.    [provider]  Nebulizers (COMPRESSOR/NEBULIZER) MISC Use as directed 06/08/18   Jetty Duhamel D, MD  rosuvastatin (CRESTOR) 10 MG tablet Take 1 tablet (10 mg  total) by mouth daily. 08/23/19   Corky Crafts, MD  venlafaxine (EFFEXOR) 75 MG tablet Take 75 mg by mouth 3 (three) times daily with meals.    [provider]    Allergies    Penicillins, Cefuroxime, Excedrin tension headache [acetaminophen-caffeine], Hydrocodone, Vicodin [hydrocodone-acetaminophen], and Codeine  Review of Systems   Review of Systems  Constitutional: Negative for fever.  HENT: Negative for ear pain.   Eyes: Negative for pain.  Respiratory: Negative for cough.   Cardiovascular: Negative for chest pain.  Gastrointestinal: Positive for abdominal pain.  Genitourinary: Negative for flank pain.  Musculoskeletal: Negative for back pain.  Skin: Negative for rash.  Neurological: Negative for headaches.    Physical Exam Updated Vital Signs BP (!) 121/48 (BP Location: Right Arm)   Pulse 63   Temp 98.7 F (37.1 C) (Oral)   Resp 15   SpO2 95%   Physical Exam Constitutional:      General: She is not in acute distress.    Appearance: Normal appearance.  HENT:     Head: Normocephalic.     Nose: Nose normal.  Eyes:     Extraocular Movements: Extraocular movements intact.  Cardiovascular:     Rate and Rhythm: Normal rate.  Pulmonary:     Effort: Pulmonary effort is normal.  Abdominal:     Tenderness: There is abdominal tenderness.     Comments: Mild to  moderate tenderness in the right lower quadrant.  Musculoskeletal:        General: Normal range of motion.     Cervical back: Normal range of motion.  Neurological:     General: No focal deficit present.     Mental Status: She is alert. Mental status is at baseline.     ED Results / Procedures / Treatments   Labs (all labs ordered are listed, but only abnormal results are displayed) Labs Reviewed  COMPREHENSIVE METABOLIC PANEL - Abnormal; Notable for the following components:      Result Value   BUN 6 (*)    Calcium 8.6 (*)    Total Protein 6.0 (*)    All other components within normal limits  URINALYSIS, ROUTINE W REFLEX MICROSCOPIC - Abnormal; Notable for the following components:   Color, Urine STRAW (*)    Specific Gravity, Urine 1.003 (*)    All other components within normal limits  CBC WITH DIFFERENTIAL/PLATELET  LIPASE, BLOOD    EKG None  Radiology CT Abdomen Pelvis W Contrast  Result Date: 07/26/2020 CLINICAL DATA:  Right lower quadrant pain. EXAM: CT ABDOMEN AND PELVIS WITH CONTRAST TECHNIQUE: Multidetector CT imaging of the abdomen and pelvis was performed using the standard protocol following bolus administration of intravenous contrast. CONTRAST:  OMNIPAQUE IOHEXOL 300 MG/ML  SOLN COMPARISON:  None. FINDINGS: Lower chest: No acute abnormality. Normal size heart. No significant pericardial effusion/thickening. Hepatobiliary: Hepatic steatosis. Mild fissural widening with possible subtle contour nodularity. Bilobar hypodense hepatic lesions measuring up to 2.1 cm in the right lobe of the liver on image 19/3 consistent with hepatic cysts. Bilobar hyperenhancing foci for instance a 7 mm focus in the posterior aspect of the right lobe of the liver on image 29/3 and a 3 mm focus in the anterior left lobe of the liver on image 14/3. Gallbladder is unremarkable. No biliary ductal dilation. Pancreas: Unremarkable. No pancreatic ductal dilatation or surrounding inflammatory  changes. Spleen: Normal in size without focal abnormality. Adrenals/Urinary Tract: Adrenal glands are unremarkable. Kidneys are normal, without renal calculi, focal  lesion, or hydronephrosis. Bladder is unremarkable. Stomach/Bowel: Stomach appears normal for degree of distension. No suspicious small bowel wall thickening or dilation. The appendix and terminal ileum appear normal. Small volume of formed stool throughout the colon. Left-sided colonic diverticulosis without findings of acute diverticulitis. Vascular/Lymphatic: Aortic atherosclerosis. No enlarged abdominal or pelvic lymph nodes. Reproductive: Status post hysterectomy. No adnexal masses. Other: No abdominopelvic ascites. Musculoskeletal: Slightly sclerotic lesion in the L4 vertebral bodies favored to bone island. Multilevel degenerative changes spine. No acute osseous abnormality. IMPRESSION: 1. No acute abdominopelvic findings. Normal appendix. 2. Left-sided colonic diverticulosis without findings of acute diverticulitis. 3. Hepatic steatosis. Mild fissural widening with possible subtle contour nodularity, suggestive of cirrhosis. Recommend clinical correlation. 4. Bilobar hyperenhancing foci measuring up to 7 mm, which may represent flash filling hemangioma or intrahepatic shunt but are incompletely characterized and in the setting of hepatocellular disease would warrant further characterization. Nonemergent liver protocol MRI of the abdomen with and without contrast is suggested if clinically indicated. 5. Aortic atherosclerosis. Aortic Atherosclerosis (ICD10-I70.0). Electronically Signed   By: Maudry MayhewJeffrey  Waltz MD   On: 07/26/2020 16:53    Procedures Procedures   Medications Ordered in ED Medications  iohexol (OMNIPAQUE) 300 MG/ML solution 100 mL (100 mLs Intravenous Contrast Given 07/26/20 1621)    ED Course  I have reviewed the triage vital signs and the nursing notes.  Pertinent labs & imaging results that were available during my care  of the patient were reviewed by me and considered in my medical decision making (see chart for details).    MDM Rules/Calculators/A&P                          Patient describes the pain as a 5 out of 10 at this time.  She has some tenderness in the right lower quadrant.  Labs otherwise unremarkable urinalysis negative.  CT abdomen pelvis shows no acute appendicitis signs.  Liver hemangiomas noted, patient advised outpatient follow-up with her doctor within the week regarding these findings.  Final Clinical Impression(s) / ED Diagnoses Final diagnoses:  Right lower quadrant abdominal pain    Rx / DC Orders ED Discharge Orders    None       Cheryll CockayneHong, Filip Luten S, MD 07/26/20 Barry Brunner1935

## 2020-07-26 NOTE — Discharge Instructions (Addendum)
There were some liver lesions that were seen on the CT scan.  The radiologist recommend you follow-up with your doctor within the next week or so.  These do not appear to be the cause of your pain however.  Otherwise there are no signs of appendicitis or other process at this time.  Return immediately if you have fevers worsening pain or any additional concerns.

## 2020-08-07 ENCOUNTER — Ambulatory Visit: Payer: BC Managed Care – PPO

## 2020-08-08 ENCOUNTER — Other Ambulatory Visit: Payer: Self-pay

## 2020-08-08 ENCOUNTER — Ambulatory Visit
Admission: RE | Admit: 2020-08-08 | Discharge: 2020-08-08 | Disposition: A | Discharge status: Home / Self Care | Payer: BC Managed Care – PPO | Source: Ambulatory Visit | Attending: Obstetrics and Gynecology | Admitting: Obstetrics and Gynecology

## 2020-08-08 DIAGNOSIS — Z1231 Encounter for screening mammogram for malignant neoplasm of breast: Secondary | ICD-10-CM

## 2020-08-08 NOTE — Progress Notes (Signed)
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  FVC 1.62/45%, FEV1 1.12/40%, ratio 0.69, FEF 25-75% 0.69/27% -------------------------------------------------------------------------   08/10/19- 65 yo female former smoker followed for allergic rhinitis, asthma/COPD, complicated by embolic stroke RMCA/ ASD closed 2016 Enrolled in Lung Cancer Screening CT program by Kandice Robinsons. Bevespi, Singulair, albuterol HFA, Tessalon, neb Duoneb Had Cardiac CT last year - mild CAD -----f/u COPD mixed type.  Had 2 Covax Phizer Mentions got choked and strangled twice while eating. No recent need for nebulizer. Used rescue last night. Occasional wheeze and cough, and some DOE. Clear mucus. No edema or chest pain.  Would like depo inj- big help last Spring.   08/09/20-  65 yo female former smoker(50 pk yrs) followed for allergic rhinitis, COPD mixed type, complicated by embolic stroke RMCA/ ASD closed 2016, Covid infection Jan 2022. Enrolled in Lung Cancer Screening CT program by Kandice Robinsons. -Eyvonne Left, Singulair, albuterol HFA, Tessalon, neb Duoneb Covid vax-2 pHIZER Recent ED visit for abdominal pain Act score -20 Adjustable bed- helps to elevate head. Asks to refill cough syrup to hold- discussed. We had given Breztri samples She liked samples Breztri but has gone back to [place] until this visit.  Incidental Covid infection Jan 2022. Feels somewhat tired, denies snore. Unclear if related to Covid.  CXR 08/10/19- IMPRESSION: Stable moderate changes of chronic bronchitis and/or asthma. No acute cardiopulmonary disease.                 ROS-see HPI   + = positive Constitutional:   No-   weight loss, night sweats, fevers,  chills, fatigue, lassitude. HEENT:   + headaches, difficulty swallowing, tooth/dental problems, sore throat,       No-  sneezing, itching, ear ache,  +nasal congestion, post nasal drip,  CV:  No-   chest pain, orthopnea, PND, swelling in lower extremities, anasarca, dizziness, palpitations Resp:   shortness of breath with exertion or at rest.                 productive cough,  No non-productive cough,  No- coughing up of blood.               change in color of mucus.   +wheezing.   Skin: No-   rash or lesions. GI:  No-   heartburn, indigestion, abdominal pain, nausea, vomiting, GU:  MS:  No-   joint pain or swelling.   Neuro-     nothing unusual Psych:  No- change in mood or affect. No depression or anxiety.  No memory loss.  OBJ- Physical Exam   General- Alert, Oriented, Affect-appropriate, Distress- none acute Skin- rash-none, lesions- none, excoriation- none Lymphadenopathy- none Head- atraumatic            Eyes- Gross vision intact, PERRLA, conjunctivae and secretions clear            Ears- Hearing, canals-normal            Nose- + turbinate edema, no-Septal dev, mucus, polyps, erosion, perforation             Throat- Mallampati II , mucosa cobblestoned , drainage- none, tonsils- atrophic,            Neck- flexible , trachea midline, no stridor , thyroid nl, carotid no bruit Chest - symmetrical  excursion , unlabored           Heart/CV- RRR , no murmur , no gallop  , no rub, nl s1 s2                           - JVD- none , edema- none, stasis changes- none, varices- none           Lung- + diminished / clear, wheeze- none, cough- none , dullness-none, rub- none           Chest wall-  Abd-  Br/ Gen/ Rectal- Not done, not indicated Extrem- cyanosis- none, clubbing, none, atrophy- none, strength- nl Neuro- grossly intact to observation

## 2020-08-09 ENCOUNTER — Encounter: Payer: Self-pay | Admitting: Internal Medicine

## 2020-08-09 ENCOUNTER — Ambulatory Visit (INDEPENDENT_AMBULATORY_CARE_PROVIDER_SITE_OTHER): Payer: BC Managed Care – PPO | Admitting: Internal Medicine

## 2020-08-09 DIAGNOSIS — J302 Other seasonal allergic rhinitis: Secondary | ICD-10-CM | POA: Diagnosis not present

## 2020-08-09 DIAGNOSIS — J3089 Other allergic rhinitis: Secondary | ICD-10-CM | POA: Diagnosis not present

## 2020-08-09 DIAGNOSIS — J449 Chronic obstructive pulmonary disease, unspecified: Secondary | ICD-10-CM

## 2020-08-09 MED ORDER — BREZTRI AEROSPHERE 160-9-4.8 MCG/ACT IN AERO: INHALATION_SPRAY | RESPIRATORY_TRACT | 12 refills | Status: AC

## 2020-08-09 MED ORDER — PROMETHAZINE-CODEINE 6.25-10 MG/5ML PO SYRP: 5.0000 mL | ORAL_SOLUTION | Freq: Four times a day (QID) | ORAL | 0 refills | Status: DC | PRN

## 2020-08-09 NOTE — Assessment & Plan Note (Signed)
Discussed Breztri and will change to that. Discussed depomedrol vs Biologics Plan- change Bevespi to [employer]

## 2020-08-09 NOTE — Patient Instructions (Signed)
Script sent for [employer] inhaler to use instead of [employer] sent for promethaziine-codeine cough syrup- use very sparingly  Please call if we can help

## 2020-08-10 NOTE — Assessment & Plan Note (Signed)
Stuffy at times this Spring, but seems to feel adequately controlled. Plan- otc AH and flonase as needed

## 2020-10-26 ENCOUNTER — Other Ambulatory Visit: Payer: Self-pay | Admitting: Interventional Cardiology

## 2021-01-22 ENCOUNTER — Other Ambulatory Visit (HOSPITAL_COMMUNITY): Payer: Self-pay

## 2021-01-22 DIAGNOSIS — R1310 Dysphagia, unspecified: Secondary | ICD-10-CM

## 2021-02-02 ENCOUNTER — Other Ambulatory Visit: Payer: Self-pay

## 2021-02-02 ENCOUNTER — Encounter (HOSPITAL_COMMUNITY): Payer: Self-pay

## 2021-02-02 ENCOUNTER — Ambulatory Visit (HOSPITAL_COMMUNITY): Payer: BC Managed Care – PPO

## 2021-02-02 ENCOUNTER — Ambulatory Visit (HOSPITAL_COMMUNITY): Admission: RE | Admit: 2021-02-02 | Payer: BC Managed Care – PPO | Source: Ambulatory Visit

## 2021-02-02 ENCOUNTER — Telehealth: Payer: Self-pay | Admitting: Internal Medicine

## 2021-02-02 MED ORDER — PREDNISONE 10 MG PO TABS: ORAL_TABLET | ORAL | 0 refills | Status: DC

## 2021-02-02 MED ORDER — AZITHROMYCIN 250 MG PO TABS: ORAL_TABLET | ORAL | 0 refills | Status: AC

## 2021-02-02 NOTE — Telephone Encounter (Signed)
Zpak 250 mg, # 6, 2 today then one daily  Prednisone 10 mg, # 20, 4 X 2 DAYS, 3 X 2 DAYS, 2 X 2 DAYS, 1 X 2 DAYS  Advise Covid test

## 2021-02-02 NOTE — Telephone Encounter (Signed)
Patient is aware of recommendations and voiced her understanding.  Zpak and prednisone has been sent to preferred pharmacy.  Nothing further needed at this time.   

## 2021-02-02 NOTE — Telephone Encounter (Signed)
Spoke to patient. She is requesting OV. No availability. C/o voice hoarseness, chest discomfort with deep breathing, prod cough green sputum and mild body aches x2d. Two of her daughter's recently had the flu. Sob is baseline.  Denies f/c/s or additional sx.  She is taking mucinex BID, ventolin BID and Breztri PRN 2 covid vaccines.  No recent covid test.  No supplemental oxygen. Spo2 is maintaining around 94%.   Dr.  Maple Hudson, please advise. Thanks.  Allergies  Allergen Reactions   Penicillins Swelling   Cefuroxime Swelling   Excedrin Tension Headache [Acetaminophen-Caffeine] Swelling   Hydrocodone Nausea Only   Vicodin [Hydrocodone-Acetaminophen]    Codeine Nausea Only    Current Outpatient Medications on File Prior to Visit  Medication Sig Dispense Refill   acetaminophen (TYLENOL) 325 MG tablet Take 650 mg by mouth every 6 (six) hours as needed for headache.     albuterol (PROVENTIL HFA) 108 (90 Base) MCG/ACT inhaler Inhale 2 puffs into the lungs every 4 (four) hours as needed for wheezing or shortness of breath. ProAir 18 g 12   aspirin EC 81 MG tablet Take 81 mg by mouth daily.     benzonatate (TESSALON) 200 MG capsule Take 1 capsule (200 mg total) by mouth 3 (three) times daily as needed for cough. 30 capsule 12   Budeson-Glycopyrrol-Formoterol (BREZTRI AEROSPHERE) 160-9-4.8 MCG/ACT AERO Inhale 2 puffs then rinse mouth, twice daily 10.7 g 12   clonazePAM (KLONOPIN) 2 MG tablet Take 2 mg by mouth at bedtime. For restless legs  0   cloNIDine (CATAPRES) 0.1 MG tablet SMARTSIG:1-2 Tablet(s) By Mouth Every 12 Hours     fluticasone (FLONASE) 50 MCG/ACT nasal spray Place 2 sprays into both nostrils daily.     ipratropium-albuterol (DUONEB) 0.5-2.5 (3) MG/3ML SOLN Take 3 mLs by nebulization every 6 (six) hours as needed. 75 mL 12   montelukast (SINGULAIR) 10 MG tablet TAKE 1 TABLET BY MOUTH EVERY DAY 90 tablet 3   Multiple Vitamin (MULTIVITAMIN WITH MINERALS) TABS tablet Take 1 tablet by  mouth daily.     Nebulizers (COMPRESSOR/NEBULIZER) MISC Use as directed 1 each 0   promethazine-codeine (PHENERGAN WITH CODEINE) 6.25-10 MG/5ML syrup Take 5 mLs by mouth every 6 (six) hours as needed for cough. 200 mL 0   rosuvastatin (CRESTOR) 10 MG tablet TAKE 1 TABLET BY MOUTH EVERY DAY 90 tablet 3   venlafaxine (EFFEXOR) 75 MG tablet Take 75 mg by mouth 3 (three) times daily with meals.     No current facility-administered medications on file prior to visit.

## 2021-02-09 ENCOUNTER — Other Ambulatory Visit: Payer: Self-pay

## 2021-02-09 ENCOUNTER — Encounter (HOSPITAL_COMMUNITY): Payer: Self-pay

## 2021-02-09 ENCOUNTER — Ambulatory Visit (HOSPITAL_COMMUNITY): Payer: BC Managed Care – PPO

## 2021-02-09 ENCOUNTER — Ambulatory Visit (HOSPITAL_COMMUNITY): Admission: RE | Admit: 2021-02-09 | Payer: BC Managed Care – PPO | Source: Ambulatory Visit

## 2021-02-15 ENCOUNTER — Other Ambulatory Visit: Payer: Self-pay

## 2021-02-15 ENCOUNTER — Ambulatory Visit (HOSPITAL_COMMUNITY)
Admission: RE | Admit: 2021-02-15 | Discharge: 2021-02-15 | Disposition: A | Discharge status: Home / Self Care | Payer: BC Managed Care – PPO | Source: Ambulatory Visit | Attending: Physician Assistant | Admitting: Physician Assistant

## 2021-02-15 DIAGNOSIS — R1310 Dysphagia, unspecified: Secondary | ICD-10-CM | POA: Diagnosis present

## 2021-02-15 NOTE — Progress Notes (Signed)
Modified Barium Swallow Progress Note  Patient Details  Name: [name] MRN: [id number] Date of Birth: [birth year]  Today's Date: 02/15/2021  Modified Barium Swallow completed.  Full report located under Chart Review in the Imaging Section.  Brief recommendations include the following:  Clinical Impression  Pt demonstrates no swallowing impairment. Esophageal sweep appeared WNL. Discussed moistening dry granular solids given report of xerostomia. No other diet modification or SLP f/u needed, will sign off.   Swallow Evaluation Recommendations       SLP Diet Recommendations: Regular solids;Thin liquid   Liquid Administration via: Cup;Straw   Medication Administration: Whole meds with liquid   Supervision: Patient able to self feed   Compensations: Follow solids with liquid                Frankey Botting, Riley Nearing 02/15/2021,1:38 PM

## 2021-04-17 ENCOUNTER — Other Ambulatory Visit: Payer: Self-pay | Admitting: Nurse Practitioner

## 2021-04-17 ENCOUNTER — Ambulatory Visit
Admission: RE | Admit: 2021-04-17 | Discharge: 2021-04-17 | Disposition: A | Discharge status: Home / Self Care | Payer: BC Managed Care – PPO | Source: Ambulatory Visit | Attending: Nurse Practitioner | Admitting: Nurse Practitioner

## 2021-04-17 DIAGNOSIS — R059 Cough, unspecified: Secondary | ICD-10-CM

## 2021-04-17 DIAGNOSIS — J449 Chronic obstructive pulmonary disease, unspecified: Secondary | ICD-10-CM

## 2021-04-20 ENCOUNTER — Telehealth: Payer: Self-pay | Admitting: Internal Medicine

## 2021-04-20 ENCOUNTER — Other Ambulatory Visit: Payer: Self-pay

## 2021-04-20 DIAGNOSIS — J988 Other specified respiratory disorders: Secondary | ICD-10-CM

## 2021-04-20 MED ORDER — AZITHROMYCIN 250 MG PO TABS: 250.0000 mg | ORAL_TABLET | Freq: Every day | ORAL | 0 refills | Status: DC

## 2021-04-20 NOTE — Telephone Encounter (Signed)
Called patient to let her know the medication that was sent into pharmacy. No questions noted from patient. Advised her to get covid tested as soon as possible in regards to symptoms. Nothing further noted at this time

## 2021-04-20 NOTE — Telephone Encounter (Signed)
Ok to send Zpak 250 mg, # 6, 2 today then one daily. Take this while finishing doxycycline. Any body with respiratory infection should test for Covid. We have treatment if that is identified

## 2021-04-20 NOTE — Telephone Encounter (Signed)
Called and spoke with patient she states she went to PCP on Monday and was diagnosed with upper respitory infection. She is hoarse and has productive cough with yellow pheglm. Offered appt with an App on Monday, but she denied. PCP prescribed cough syrup and antibiotics but it's not working. Would like something called in. Pharmacy is [facility].   The antibiotic is: Doxy 100mg  BID 10 days  Cough syrup: Promethazine cough syrup 31mL   Please advise Dr 12m

## 2021-04-20 NOTE — Telephone Encounter (Signed)
Pt went to PCP on Monday and was diagnosed with upper respitory infection. She is hoarse and has productive cough with yellow pheglm. Offered appt with an App on Monday, but she denied. PCP prescribed cough syrup and antibiotics but it's not working. Would like something called in. Pharmacy is [facility].

## 2021-06-11 NOTE — Progress Notes (Unsigned)
Cardiology Office Note   Date:  06/11/2021   ID:  [name], DOB [birth year], MRN [id number]  PCP:  Kaleen Mask, MD    No chief complaint on file.    Wt Readings from Last 3 Encounters:  08/09/20 184 lb 6.4 oz (83.6 kg)  05/25/20 195 lb 12.8 oz (88.8 kg)  08/23/19 187 lb 1.9 oz (84.9 kg)       History of Present Illness: [name] is a 66 y.o. female   who had an ASD closure in 2017.  She reports fatigue since that time.     She had a lung cancer screening CT showing: Mild atherosclerotic calcifications of the aortic arch.   Mild coronary atherosclerosis in the LAD by CTA in 2020: "LM: Normal   LAD: 25-49% calcific proximal LAD stenosis   D1: Normal   D2: Normal   Circumflex: Normal   OM1: Normal   OM2: Normal   RCA: Normal   PDA: Normal   PLA: Normal   IMPRESSION: 1. Calcium score 52 isolated to proximal LAD This is 5 th percentile for age and sex   2.  CAD-RADS 2 24-49% non obstructive CAD in proximal LAD   3.  Normal aortic root diameter 3.2 cm"     She smoked for years and stopped in 2005.   Brother with CABG at 25.    She did get her COVID vaccines.   She has had some SHOB/DOE ni the paste, and also has underlying lung disease.   In 2/22, she had some right sided chest pain that radiated to her right shoulder and right arm. She thinks she may have reached with her right arm in a different way and strained.  She feels this is more like a muscle strain.      Past Medical History:  Diagnosis Date   Acute maxillary sinusitis 12/08/2017   ADHD (attention deficit hyperactivity disorder)    ALLERGIC RHINITIS    Asthma    ASTHMA 02/27/2009   Qualifier: Diagnosis of  By: Roxan Hockey CMA, Jessica     Atrial septal defect    prior atrial septal defect stable following closure with amplatzer device   Classic migraine 07/14/2014   Claustrophobia    unable to ride in elavator;leave room door open   COPD (chronic obstructive  pulmonary disease) (HCC)    COPD mixed type (HCC) 05/17/2014   PFT 11/02/13- mild obstructive airways disease with insignificant response to bronchodilator    COPD with acute bronchitis (HCC) 03/27/2009   Qualifier: Diagnosis of  By: Maple Hudson MD, Clinton D    CVA (cerebral infarction) 07/14/2014   Depression with anxiety 01/16/2015   Fatigue    of uncertain etiology   Former smoker 03/20/2017   Gait disturbance, 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   History of frequent urinary tract infections    Migraine headache    since childhood   Numbness 07/14/2014   Other headache syndrome 09/12/2014   Overweight    Patent foramen ovale 07/12/2014   PONV (postoperative nausea and vomiting)    Pt states Zofran does not relieve sx; prefers Phenergan   Post-menopause on HRT (hormone replacement therapy)    Restless leg    Seasonal and perennial allergic rhinitis 02/27/2009   Qualifier: Diagnosis of  By: Roxan Hockey CMA, Jessica     Stroke [facility])    Stroke occurring within last month 07/12/2014   Upper airway cough syndrome 04/29/2015   Trial of max gerd rx 04/28/2015 >>>  URI 03/13/2009   Qualifier: Diagnosis of  By: Maple HudsonYoung MD, Clinton D     Past Surgical History:  Procedure Laterality Date   ABDOMINAL HYSTERECTOMY     amplatzer septal occluder  07/2014   ANTERIOR CERVICAL DECOMP/DISCECTOMY FUSION  05/06/2012   Procedure: ANTERIOR CERVICAL DECOMPRESSION/DISCECTOMY FUSION 2 LEVELS;  Surgeon: Emilee HeroMark Leonard Dumonski, MD;  [facility];  Service: Orthopedics;  Laterality: Bilateral;  Anterior cervical decompression fusion, cervical 5-6, cervical 6-7 with instrumentation and allograft.   ASD REPAIR     BLADDER SUSPENSION  2001   cardiac and vascular implants and grafts     CARDIAC CATHETERIZATION N/A 08/08/2014   Procedure: ASD Closure;  Surgeon: Tonny BollmanMichael Cooper, MD;  [facility];  Service: Cardiovascular;  Laterality: N/A;   PARTIAL HYSTERECTOMY     sinus surgery repair     TEE WITHOUT CARDIOVERSION  N/A 07/15/2014   Procedure: TRANSESOPHAGEAL ECHOCARDIOGRAM (TEE);  Surgeon: Thurmon FairMihai Croitoru, MD;  [facility];  Service: Cardiovascular;  Laterality: N/A;   TUBAL LIGATION     tubel ligation       Current Outpatient Medications  Medication Sig Dispense Refill   azithromycin (ZITHROMAX) 250 MG tablet Take 1 tablet (250 mg total) by mouth daily. 6 tablet 0   acetaminophen (TYLENOL) 325 MG tablet Take 650 mg by mouth every 6 (six) hours as needed for headache.     albuterol (PROVENTIL HFA) 108 (90 Base) MCG/ACT inhaler Inhale 2 puffs into the lungs every 4 (four) hours as needed for wheezing or shortness of breath. ProAir 18 g 12   aspirin EC 81 MG tablet Take 81 mg by mouth daily.     benzonatate (TESSALON) 200 MG capsule Take 1 capsule (200 mg total) by mouth 3 (three) times daily as needed for cough. 30 capsule 12   Budeson-Glycopyrrol-Formoterol (BREZTRI AEROSPHERE) 160-9-4.8 MCG/ACT AERO Inhale 2 puffs then rinse mouth, twice daily 10.7 g 12   clonazePAM (KLONOPIN) 2 MG tablet Take 2 mg by mouth at bedtime. For restless legs  0   cloNIDine (CATAPRES) 0.1 MG tablet SMARTSIG:1-2 Tablet(s) By Mouth Every 12 Hours     fluticasone (FLONASE) 50 MCG/ACT nasal spray Place 2 sprays into both nostrils daily.     ipratropium-albuterol (DUONEB) 0.5-2.5 (3) MG/3ML SOLN Take 3 mLs by nebulization every 6 (six) hours as needed. 75 mL 12   montelukast (SINGULAIR) 10 MG tablet TAKE 1 TABLET BY MOUTH EVERY DAY 90 tablet 3   Multiple Vitamin (MULTIVITAMIN WITH MINERALS) TABS tablet Take 1 tablet by mouth daily.     Nebulizers (COMPRESSOR/NEBULIZER) MISC Use as directed 1 each 0   predniSONE (DELTASONE) 10 MG tablet 4tabX2d,3tabX2d, 2tabX2d,1tabX2d 20 tablet 0   promethazine-codeine (PHENERGAN WITH CODEINE) 6.25-10 MG/5ML syrup Take 5 mLs by mouth every 6 (six) hours as needed for cough. 200 mL 0   rosuvastatin (CRESTOR) 10 MG tablet TAKE 1 TABLET BY MOUTH EVERY DAY 90 tablet 3   venlafaxine  (EFFEXOR) 75 MG tablet Take 75 mg by mouth 3 (three) times daily with meals.     No current facility-administered medications for this visit.    Allergies:   Penicillins, Cefuroxime, Excedrin tension headache [acetaminophen-caffeine], Hydrocodone, Vicodin [hydrocodone-acetaminophen], and Codeine    Social History:  The patient  reports that she quit smoking about 18 years ago. Her smoking use included cigarettes. She has a 50.00 pack-year smoking history. She has never used smokeless tobacco. She reports that she does not drink alcohol and does not use  drugs.   Family History:  The patient's ***family history includes Allergies in her mother; Asthma in her mother; Breast cancer in her cousin; Congestive Heart Failure in her mother; Diabetes Mellitus I in her brother; Heart attack in her mother; Heart disease in her father; Hepatitis C in her brother; Lung cancer in her brother.    ROS:  Please see the history of present illness.   Otherwise, review of systems are positive for ***.   All other systems are reviewed and negative.    PHYSICAL EXAM: VS:  There were no vitals taken for this visit. , BMI There is no height or weight on file to calculate BMI. GEN: Well nourished, well developed, in no acute distress HEENT: normal Neck: no JVD, carotid bruits, or masses Cardiac: ***RRR; no murmurs, rubs, or gallops,no edema  Respiratory:  clear to auscultation bilaterally, normal work of breathing GI: soft, nontender, nondistended, + BS MS: no deformity or atrophy Skin: warm and dry, no rash Neuro:  Strength and sensation are intact Psych: euthymic mood, full affect   EKG:   The ekg ordered today demonstrates ***   Recent Labs: 07/26/2020: ALT 16; BUN 6; Creatinine, Ser 0.75; Hemoglobin 12.7; Platelets 233; Potassium 3.6; Sodium 142   Lipid Panel    Component Value Date/Time   CHOL 228 (H) 12/27/2019 0844   TRIG 367 (H) 12/27/2019 0844   HDL 38 (L) 12/27/2019 0844   CHOLHDL 6.0 (H)  12/27/2019 0844   CHOLHDL 5 09/05/2015 0944   VLDL 44.2 (H) 09/05/2015 0944   LDLCALC 125 (H) 12/27/2019 0844   LDLDIRECT 125.0 09/05/2015 0944     Other studies Reviewed: Additional studies/ records that were reviewed today with results demonstrating: ***.   ASSESSMENT AND PLAN:  CAD:  Hyperlipidemia: PreDM: DOE: Has a h/o chronic asthma. S/p ASD repair   Current medicines are reviewed at length with the patient today.  The patient concerns regarding her medicines were addressed.  The following changes have been made:  No change***  Labs/ tests ordered today include: *** No orders of the defined types were placed in this encounter.   Recommend 150 minutes/week of aerobic exercise Low fat, low carb, high fiber diet recommended  Disposition:   FU in ***   Signed, Lance Muss, MD  06/11/2021 7:56 AM    [facility] [address] [place], [place], [place]  [address] Phone: [phone]; Fax: [phone]

## 2021-06-13 ENCOUNTER — Other Ambulatory Visit: Payer: Self-pay

## 2021-06-13 ENCOUNTER — Ambulatory Visit (INDEPENDENT_AMBULATORY_CARE_PROVIDER_SITE_OTHER): Payer: BC Managed Care – PPO | Admitting: Interventional Cardiology

## 2021-06-13 ENCOUNTER — Encounter: Payer: Self-pay | Admitting: Interventional Cardiology

## 2021-06-13 VITALS — BP 150/82 | HR 82 | Ht 66.0 in | Wt 193.0 lb

## 2021-06-13 DIAGNOSIS — E782 Mixed hyperlipidemia: Secondary | ICD-10-CM

## 2021-06-13 DIAGNOSIS — R7303 Prediabetes: Secondary | ICD-10-CM

## 2021-06-13 DIAGNOSIS — R0609 Other forms of dyspnea: Secondary | ICD-10-CM

## 2021-06-13 DIAGNOSIS — G4730 Sleep apnea, unspecified: Secondary | ICD-10-CM

## 2021-06-13 DIAGNOSIS — Q211 Atrial septal defect, unspecified: Secondary | ICD-10-CM | POA: Diagnosis not present

## 2021-06-13 DIAGNOSIS — I25118 Atherosclerotic heart disease of native coronary artery with other forms of angina pectoris: Secondary | ICD-10-CM | POA: Diagnosis not present

## 2021-06-13 DIAGNOSIS — R5383 Other fatigue: Secondary | ICD-10-CM

## 2021-06-13 DIAGNOSIS — R0683 Snoring: Secondary | ICD-10-CM

## 2021-06-13 NOTE — Patient Instructions (Signed)
Medication Instructions:  ?Your physician recommends that you continue on your current medications as directed. Please refer to the Current Medication list given to you today. ? ?*If you need a refill on your cardiac medications before your next appointment, please call your pharmacy* ? ? ?Lab Work: ?none ?If you have labs (blood work) drawn today and your tests are completely normal, you will receive your results only by: ?MyChart Message (if you have MyChart) OR ?A paper copy in the mail ?If you have any lab test that is abnormal or we need to change your treatment, we will call you to review the results. ? ? ?Testing/Procedures: ?Your physician has requested that you have an echocardiogram. Echocardiography is a painless test that uses sound waves to create images of your heart. It provides your doctor with information about the size and shape of your heart and how well your heart?s chambers and valves are working. This procedure takes approximately one hour. There are no restrictions for this procedure. ? ?Your physician has recommended that you have a sleep study. This test records several body functions during sleep, including: brain activity, eye movement, oxygen and carbon dioxide blood levels, heart rate and rhythm, breathing rate and rhythm, the flow of air through your mouth and nose, snoring, body muscle movements, and chest and belly movement. ? ? ? ?Follow-Up: ?At [facility], you and your health needs are our priority.  As part of our continuing mission to provide you with exceptional heart care, we have created designated Provider Care Teams.  These Care Teams include your primary Cardiologist (physician) and Advanced Practice Providers (APPs -  Physician Assistants and Nurse Practitioners) who all work together to provide you with the care you need, when you need it. ? ?We recommend signing up for the patient portal called "MyChart".  Sign up information is provided on this After Visit Summary.   MyChart is used to connect with patients for Virtual Visits (Telemedicine).  Patients are able to view lab/test results, encounter notes, upcoming appointments, etc.  Non-urgent messages can be sent to your provider as well.   ?To learn more about what you can do with MyChart, go to [email or web address].   ? ?Your next appointment:   ?12 month(s) ? ?The format for your next appointment:   ?In Person ? ?Provider:   ?Lance Muss, MD   ? ? ?Other Instructions ? ? ?

## 2021-06-29 ENCOUNTER — Ambulatory Visit (HOSPITAL_COMMUNITY)
Admission: RE | Admit: 2021-06-29 | Discharge: 2021-06-29 | Disposition: A | Discharge status: Home / Self Care | Payer: BC Managed Care – PPO | Source: Ambulatory Visit | Attending: Interventional Cardiology | Admitting: Interventional Cardiology

## 2021-06-29 ENCOUNTER — Encounter: Payer: Self-pay | Admitting: Adult Health

## 2021-06-29 ENCOUNTER — Ambulatory Visit (INDEPENDENT_AMBULATORY_CARE_PROVIDER_SITE_OTHER): Payer: BC Managed Care – PPO | Admitting: Adult Health

## 2021-06-29 VITALS — BP 122/76 | HR 89 | Temp 98.3°F | Ht 66.0 in | Wt 190.0 lb

## 2021-06-29 DIAGNOSIS — J449 Chronic obstructive pulmonary disease, unspecified: Secondary | ICD-10-CM | POA: Diagnosis not present

## 2021-06-29 DIAGNOSIS — J302 Other seasonal allergic rhinitis: Secondary | ICD-10-CM | POA: Diagnosis not present

## 2021-06-29 DIAGNOSIS — Q211 Atrial septal defect, unspecified: Secondary | ICD-10-CM | POA: Diagnosis not present

## 2021-06-29 DIAGNOSIS — J3089 Other allergic rhinitis: Secondary | ICD-10-CM | POA: Diagnosis not present

## 2021-06-29 DIAGNOSIS — J988 Other specified respiratory disorders: Secondary | ICD-10-CM | POA: Diagnosis not present

## 2021-06-29 DIAGNOSIS — R0609 Other forms of dyspnea: Secondary | ICD-10-CM | POA: Diagnosis not present

## 2021-06-29 LAB — ECHOCARDIOGRAM COMPLETE
Area-P 1/2: 3.96 cm2
S' Lateral: 1.9 cm

## 2021-06-29 MED ORDER — PREDNISONE 10 MG PO TABS: ORAL_TABLET | ORAL | 0 refills | Status: DC

## 2021-06-29 MED ORDER — AZITHROMYCIN 250 MG PO TABS: ORAL_TABLET | ORAL | 0 refills | Status: AC

## 2021-06-29 MED ORDER — METHYLPREDNISOLONE ACETATE 80 MG/ML IJ SUSP
80.0000 mg | Freq: Once | INTRAMUSCULAR | Status: AC
  Administered 2021-06-29: 80 mg via INTRAMUSCULAR

## 2021-06-29 NOTE — Progress Notes (Signed)
? ?@Patient  ID: , female    DOB: 01-Oct-1955, 66 y.o.   MRN: [id number] ? ?Chief Complaint  ?Patient presents with  ? Follow-up  ? ? ?Referring provider: ?[id number], * ? ?HPI: ?66 year old female former smoker followed for COPD and allergic rhinitis ?COVID-19 infection January 2022 ?Medical history significant for embolic stroke 2016 ? ?TEST/EVENTS :  ? ?06/29/2021 Acute OV : COPD  ?Patient presents for an acute office visit.  She complains over the last few days that she has had increased cough congestion sore throat body aches general malaise and intermittent wheezing.  She denies any fever.  She has been taken over-the-counter cough medicine.  She also took some codeine with cough syrup at bedtime.  She denies any hemoptysis chest pain orthopnea PND or leg swelling.  Appetite is fair.  No nausea vomiting or diarrhea.  She remains on Breztri inhaler twice daily.  She had increase her albuterol use. ?She remains on Singulair daily.  Not currently taking antihistamine. ?Feels like this all started when she was outside and exposed to a lot of pollen.  Symptoms just progressively worsened and now she has a severe cough and congestion. ?Chest x-ray January 2023 showed clear lungs. ?She has not tested for COVID-19. ? ?Allergies  ?Allergen Reactions  ? Penicillins Swelling  ? Cefuroxime Swelling  ? Excedrin Tension Headache [Acetaminophen-Caffeine] Swelling  ? Hydrocodone Nausea Only  ? Codeine Nausea Only  ? Vicodin [Hydrocodone-Acetaminophen]   ?  Upset stomach  ? ? ?Immunization History  ?Administered Date(s) Administered  ? Influenza Split 12/31/2010  ? Influenza Whole 12/30/2008  ? Influenza,inj,Quad PF,6+ Mos 02/16/2014, 12/23/2014, 01/01/2016, 01/11/2020  ? Influenza-Unspecified 11/30/2012, 02/13/2014, 12/08/2017  ? Pneumococcal Conjugate-13 05/16/2014  ? Pneumococcal Polysaccharide-23 04/01/2006  ? Pneumococcal-Unspecified 12/01/2007  ? ? ?Past Medical History:  ?Diagnosis Date  ? Acute  maxillary sinusitis 12/08/2017  ? ADHD (attention deficit hyperactivity disorder)   ? ALLERGIC RHINITIS   ? Asthma   ? ASTHMA 02/27/2009  ? Qualifier: Diagnosis of  By: 03/01/2009 CMA, Roxan Hockey    ? Atrial septal defect   ? prior atrial septal defect stable following closure with amplatzer device  ? 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 Claustrophobia   ? unable to ride in elavator;leave room door open  ? COPD (chronic obstructive pulmonary disease) (HCC)   ? COPD mixed type (HCC) 05/17/2014  ? PFT 11/02/13- mild obstructive airways disease with insignificant response to bronchodilator   ? COPD with acute bronchitis (HCC) 03/27/2009  ? Qualifier: Diagnosis of  By: 03/29/2009 MD, Clinton D   ? CVA (cerebral infarction) 07/14/2014  ? Depression with anxiety 01/16/2015  ? Fatigue   ? of uncertain etiology  ? Former smoker 03/20/2017  ? 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 History of frequent urinary tract infections   ? Migraine headache   ? since childhood  ? Numbness 07/14/2014  ? Other headache syndrome 09/12/2014  ? Overweight   ? Patent foramen ovale 07/12/2014  ? PONV (postoperative nausea and vomiting)   ? Pt states Zofran does not relieve sx; prefers Phenergan  ? Post-menopause on HRT (hormone replacement therapy)   ? Restless leg   ? Seasonal and perennial allergic rhinitis 02/27/2009  ? Qualifier: Diagnosis of  By: 03/01/2009 CMA, Roxan Hockey    ? Stroke [facility])   ? Stroke occurring within last month 07/12/2014  ? Upper airway cough syndrome 04/29/2015  ? Trial of max gerd rx 04/28/2015 >>>      ? URI 03/13/2009  ?  Qualifier: Diagnosis of  By: Maple Hudson MD, Joni Fears D   ? ? ?Tobacco History: ?Social History  ? ?Tobacco Use  ?Smoking Status Former  ? Packs/day: 2.00  ? Years: 25.00  ? Pack years: 50.00  ? Types: Cigarettes  ? Quit date: 04/02/2003  ? Years since quitting: 18.2  ?Smokeless Tobacco Never  ? ?Counseling given: Not Answered ? ? ?Outpatient Medications Prior to Visit  ?Medication Sig Dispense Refill  ? acetaminophen (TYLENOL) 325 MG  tablet Take 650 mg by mouth every 6 (six) hours as needed for headache.    ? albuterol (PROVENTIL HFA) 108 (90 Base) MCG/ACT inhaler Inhale 2 puffs into the lungs every 4 (four) hours as needed for wheezing or shortness of breath. ProAir 18 g 12  ? aspirin EC 81 MG tablet Take 81 mg by mouth daily.    ? benzonatate (TESSALON) 200 MG capsule Take 1 capsule (200 mg total) by mouth 3 (three) times daily as needed for cough. 30 capsule 12  ? Budeson-Glycopyrrol-Formoterol (BREZTRI AEROSPHERE) 160-9-4.8 MCG/ACT AERO Inhale 2 puffs then rinse mouth, twice daily 10.7 g 12  ? clonazePAM (KLONOPIN) 2 MG tablet Take 2 mg by mouth at bedtime. For restless legs  0  ? fluticasone (FLONASE) 50 MCG/ACT nasal spray Place 2 sprays into both nostrils daily.    ? ipratropium-albuterol (DUONEB) 0.5-2.5 (3) MG/3ML SOLN Take 3 mLs by nebulization every 6 (six) hours as needed. 75 mL 12  ? montelukast (SINGULAIR) 10 MG tablet TAKE 1 TABLET BY MOUTH EVERY DAY 90 tablet 3  ? Multiple Vitamin (MULTIVITAMIN WITH MINERALS) TABS tablet Take 1 tablet by mouth daily.    ? Nebulizers (COMPRESSOR/NEBULIZER) MISC Use as directed 1 each 0  ? promethazine-codeine (PHENERGAN WITH CODEINE) 6.25-10 MG/5ML syrup Take 5 mLs by mouth every 6 (six) hours as needed for cough. 200 mL 0  ? venlafaxine (EFFEXOR) 75 MG tablet Take 75 mg by mouth 3 (three) times daily with meals. Patient takes once a day    ? ?No facility-administered medications prior to visit.  ? ? ? ?Review of Systems:  ? ?Constitutional:   No  weight loss, night sweats,  Fevers, chills, fatigue, or  lassitude. ? ?HEENT:   No headaches,  Difficulty swallowing,  Tooth/dental problems, or  Sore throat,  ?              No sneezing, itching, ear ache,  ?+nasal congestion, post nasal drip,  ? ?CV:  No chest pain,  Orthopnea, PND, swelling in lower extremities, anasarca, dizziness, palpitations, syncope.  ? ?GI  No heartburn, indigestion, abdominal pain, nausea, vomiting, diarrhea, change in bowel  habits, loss of appetite, bloody stools.  ? ?Resp:   No chest wall deformity ? ?Skin: no rash or lesions. ? ?GU: no dysuria, change in color of urine, no urgency or frequency.  No flank pain, no hematuria  ? ?MS:  No joint pain or swelling.  No decreased range of motion.  No back pain. ? ? ? ?Physical Exam ? ?BP 122/76 (BP Location: Left Arm, Patient Position: Sitting, Cuff Size: Normal)   Pulse 89   Temp 98.3 ?F (36.8 ?C) (Oral)   Ht 5\' 6"  (1.676 m)   Wt 190 lb (86.2 kg)   SpO2 92%   BMI 30.67 kg/m?  ? ?GEN: A/Ox3; pleasant , NAD, well nourished  ?  ?HEENT:  [place]/AT,  EACs-clear, TMs-wnl, NOSE-clear, THROAT-clear, no lesions, no postnasal drip or exudate noted.  ? ?NECK:  Supple w/ fair ROM;  no JVD; normal carotid impulses w/o bruits; no thyromegaly or nodules palpated; no lymphadenopathy.   ? ?RESP  Clear  P & A; w/o, wheezes/ rales/ or rhonchi. no accessory muscle use, no dullness to percussion ? ?CARD:  RRR, no m/r/g, no peripheral edema, pulses intact, no cyanosis or clubbing. ? ?GI:   Soft & nt; nml bowel sounds; no organomegaly or masses detected.  ? ?Musco: Warm bil, no deformities or joint swelling noted.  ? ?Neuro: alert, no focal deficits noted.   ? ?Skin: Warm, no lesions or rashes ? ? ? ?Lab Results: ? ?CBC ?   ?Component Value Date/Time  ? WBC 6.5 07/26/2020 1433  ? RBC 4.20 07/26/2020 1433  ? HGB 12.7 07/26/2020 1433  ? HCT 40.2 07/26/2020 1433  ? PLT 233 07/26/2020 1433  ? MCV 95.7 07/26/2020 1433  ? MCH 30.2 07/26/2020 1433  ? MCHC 31.6 07/26/2020 1433  ? RDW 13.2 07/26/2020 1433  ? LYMPHSABS 1.7 07/26/2020 1433  ? MONOABS 0.5 07/26/2020 1433  ? EOSABS 0.4 07/26/2020 1433  ? BASOSABS 0.0 07/26/2020 1433  ? ? ?BMET ?   ?Component Value Date/Time  ? NA 142 07/26/2020 1433  ? NA 142 08/23/2019 0854  ? K 3.6 07/26/2020 1433  ? CL 104 07/26/2020 1433  ? CO2 31 07/26/2020 1433  ? GLUCOSE 98 07/26/2020 1433  ? BUN 6 (L) 07/26/2020 1433  ? BUN 16 08/23/2019 0854  ? CREATININE 0.75 07/26/2020 1433  ?  CALCIUM 8.6 (L) 07/26/2020 1433  ? GFRNONAA >60 07/26/2020 1433  ? GFRAA 79 08/23/2019 0854  ? ? ?BNP ?No results found for: BNP ? ?ProBNP ?No results found for: PROBNP ? ?Imaging: ?ECHOCARDIOGRAM COMPLETE ? ?Resul

## 2021-06-29 NOTE — Patient Instructions (Addendum)
Zpack take as directed .  ?Mucinex DM Twice daily  As needed  cough/congestion  ?Prednisone taper over next week , start tomorrow  ?Fluids and rest .  ?Continue on BREZTRI 2 puffs Twice daily  , rinse after use.  ?Albuterol inhaler As needed   ?Follow up in Dr. Maple Hudson  as planned and As needed   ?Please contact office for sooner follow up if symptoms do not improve or worsen or seek emergency care  ? ? ? ?

## 2021-06-29 NOTE — Assessment & Plan Note (Signed)
May use Zyrtec as needed.  Continue on Singulair daily. ?

## 2021-06-29 NOTE — Assessment & Plan Note (Addendum)
Acute COPD exacerbation.  We will treat with empiric antibiotics and steroids.  Fluids and rest.  Supportive care. ?Depo-Medrol 80 mg injection in the office.  To begin prednisone tomorrow. ? ?Plan  ?Patient Instructions  ?Zpack take as directed .  ?Mucinex DM Twice daily  As needed  cough/congestion  ?Prednisone taper over next week , start tomorrow  ?Fluids and rest .  ?Continue on BREZTRI 2 puffs Twice daily  , rinse after use.  ?Albuterol inhaler As needed   ?Follow up in Dr. Maple Hudson  as planned and As needed   ?Please contact office for sooner follow up if symptoms do not improve or worsen or seek emergency care  ? ? ? ?  ? ?

## 2021-07-04 ENCOUNTER — Telehealth: Payer: Self-pay | Admitting: *Deleted

## 2021-07-04 NOTE — Telephone Encounter (Signed)
Reached out to patient to patient to confirm if she sees dr Annamaria Boots for pulmonary or for sleep or both and she stated she sees him for pulmonary. She states she does not want a cpap and she is going to try to get the inspire device because her husband has one. I let her know she would need a referral to ENT and she replied she would work on it herself.  ? ?

## 2021-07-06 ENCOUNTER — Emergency Department (HOSPITAL_BASED_OUTPATIENT_CLINIC_OR_DEPARTMENT_OTHER)
Admission: EM | Admit: 2021-07-06 | Discharge: 2021-07-06 | Disposition: A | Discharge status: Home / Self Care | Payer: BC Managed Care – PPO | Attending: Emergency Medicine | Admitting: Emergency Medicine

## 2021-07-06 ENCOUNTER — Other Ambulatory Visit: Payer: Self-pay

## 2021-07-06 ENCOUNTER — Encounter (HOSPITAL_BASED_OUTPATIENT_CLINIC_OR_DEPARTMENT_OTHER): Payer: Self-pay

## 2021-07-06 ENCOUNTER — Emergency Department (HOSPITAL_BASED_OUTPATIENT_CLINIC_OR_DEPARTMENT_OTHER): Payer: BC Managed Care – PPO

## 2021-07-06 DIAGNOSIS — Z7951 Long term (current) use of inhaled steroids: Secondary | ICD-10-CM | POA: Diagnosis not present

## 2021-07-06 DIAGNOSIS — E8889 Other specified metabolic disorders: Secondary | ICD-10-CM | POA: Insufficient documentation

## 2021-07-06 DIAGNOSIS — J449 Chronic obstructive pulmonary disease, unspecified: Secondary | ICD-10-CM | POA: Diagnosis not present

## 2021-07-06 DIAGNOSIS — Z7982 Long term (current) use of aspirin: Secondary | ICD-10-CM | POA: Diagnosis not present

## 2021-07-06 DIAGNOSIS — N3001 Acute cystitis with hematuria: Secondary | ICD-10-CM | POA: Diagnosis not present

## 2021-07-06 DIAGNOSIS — J45909 Unspecified asthma, uncomplicated: Secondary | ICD-10-CM | POA: Insufficient documentation

## 2021-07-06 DIAGNOSIS — Z87891 Personal history of nicotine dependence: Secondary | ICD-10-CM | POA: Diagnosis not present

## 2021-07-06 DIAGNOSIS — R300 Dysuria: Secondary | ICD-10-CM | POA: Diagnosis present

## 2021-07-06 DIAGNOSIS — D72829 Elevated white blood cell count, unspecified: Secondary | ICD-10-CM | POA: Insufficient documentation

## 2021-07-06 LAB — URINALYSIS, ROUTINE W REFLEX MICROSCOPIC
Bilirubin Urine: NEGATIVE
Glucose, UA: NEGATIVE mg/dL
Ketones, ur: NEGATIVE mg/dL
Nitrite: NEGATIVE
Protein, ur: 30 mg/dL — AB
Specific Gravity, Urine: 1.018 (ref 1.005–1.030)
pH: 7 (ref 5.0–8.0)

## 2021-07-06 LAB — BASIC METABOLIC PANEL
Anion gap: 7 (ref 5–15)
BUN: 16 mg/dL (ref 8–23)
CO2: 31 mmol/L (ref 22–32)
Calcium: 9 mg/dL (ref 8.9–10.3)
Chloride: 102 mmol/L (ref 98–111)
Creatinine, Ser: 0.73 mg/dL (ref 0.44–1.00)
GFR, Estimated: >60 mL/min (ref >60)
Glucose, Bld: 97 mg/dL (ref 70–99)
Potassium: 3.8 mmol/L (ref 3.5–5.1)
Sodium: 140 mmol/L (ref 135–145)

## 2021-07-06 LAB — CBC WITH DIFFERENTIAL/PLATELET
Abs Immature Granulocytes: 0.06 10*3/uL (ref 0.00–0.07)
Basophils Absolute: 0 10*3/uL (ref 0.0–0.1)
Basophils Relative: 0 %
Eosinophils Absolute: 0.4 10*3/uL (ref 0.0–0.5)
Eosinophils Relative: 3 %
HCT: 42.4 % (ref 36.0–46.0)
Hemoglobin: 13.4 g/dL (ref 12.0–15.0)
Immature Granulocytes: 0 %
Lymphocytes Relative: 16 %
Lymphs Abs: 2.2 10*3/uL (ref 0.7–4.0)
MCH: 29.6 pg (ref 26.0–34.0)
MCHC: 31.6 g/dL (ref 30.0–36.0)
MCV: 93.8 fL (ref 80.0–100.0)
Monocytes Absolute: 1 10*3/uL (ref 0.1–1.0)
Monocytes Relative: 7 %
Neutro Abs: 10.6 10*3/uL — ABNORMAL HIGH (ref 1.7–7.7)
Neutrophils Relative %: 74 %
Platelets: 279 10*3/uL (ref 150–400)
RBC: 4.52 MIL/uL (ref 3.87–5.11)
RDW: 13.4 % (ref 11.5–15.5)
WBC: 14.4 10*3/uL — ABNORMAL HIGH (ref 4.0–10.5)
nRBC: 0 % (ref 0.0–0.2)

## 2021-07-06 MED ORDER — NITROFURANTOIN MONOHYD MACRO 100 MG PO CAPS: 100.0000 mg | ORAL_CAPSULE | Freq: Two times a day (BID) | ORAL | 0 refills | Status: DC

## 2021-07-06 MED ORDER — SODIUM CHLORIDE 0.9 % IV BOLUS
1000.0000 mL | Freq: Once | INTRAVENOUS | Status: AC
  Administered 2021-07-06: 1000 mL via INTRAVENOUS

## 2021-07-06 MED ORDER — FLUCONAZOLE 150 MG PO TABS: 150.0000 mg | ORAL_TABLET | Freq: Every day | ORAL | 0 refills | Status: AC

## 2021-07-06 NOTE — Discharge Instructions (Signed)
Take antibiotics as prescribed and complete the full course.  Recheck with your doctor next week.  Return to the emergency room for worsening or concerning symptoms. ?

## 2021-07-06 NOTE — ED Notes (Signed)
States she believes she has a kidney stone.  States has pain to right lower back radiating into right groin.   ?

## 2021-07-06 NOTE — ED Triage Notes (Signed)
Right flank pain with blood in urine.

## 2021-07-06 NOTE — ED Provider Notes (Signed)
?[facility] EMER[facility] DEPT ?Provider Note ? ? ?CSN: [id number] ?Arrival date & time: 07/06/21  1151 ? ?  ? ?History ? ?Chief Complaint  ?Patient presents with  ? Flank Pain  ? ? ?[name] is a 66 y.o. female. ? ?66 year old female with complaint of dysuria and frequency onset a few days ago, progressively worsening now with blood in her urine.  States that she feels generally unwell although denies nausea, vomiting, fevers.  No history of prior kidney stones, is concerned that the blood in her urine and dysuria are related to a kidney stone as she has had family members with stones. ?Past medical history of asthma, migraines, frequent UTIs, COPD, CVA.  Former smoker. ? ? ?  ? ?Home Medications ?Prior to Admission medications   ?Medication Sig Start Date End Date Taking? Authorizing Provider  ?fluconazole (DIFLUCAN) 150 MG tablet Take 1 tablet (150 mg total) by mouth daily for 1 day. 07/06/21 07/07/21 Yes Jeannie Fend, PA-C  ?nitrofurantoin, macrocrystal-monohydrate, (MACROBID) 100 MG capsule Take 1 capsule (100 mg total) by mouth 2 (two) times daily. 07/06/21  Yes Jeannie Fend, PA-C  ?acetaminophen (TYLENOL) 325 MG tablet Take 650 mg by mouth every 6 (six) hours as needed for headache.    [provider]  ?albuterol (PROVENTIL HFA) 108 (90 Base) MCG/ACT inhaler Inhale 2 puffs into the lungs every 4 (four) hours as needed for wheezing or shortness of breath. ProAir 11/18/16   Jetty Duhamel D, MD  ?aspirin EC 81 MG tablet Take 81 mg by mouth daily.    [provider]  ?benzonatate (TESSALON) 200 MG capsule Take 1 capsule (200 mg total) by mouth 3 (three) times daily as needed for cough. 12/08/17   Jetty Duhamel D, MD  ?Budeson-Glycopyrrol-Formoterol (BREZTRI AEROSPHERE) 160-9-4.8 MCG/ACT AERO Inhale 2 puffs then rinse mouth, twice daily 08/09/20   Jetty Duhamel D, MD  ?clonazePAM (KLONOPIN) 2 MG tablet Take 2 mg by mouth at bedtime. For restless legs 07/07/14   [provider]   ?fluticasone (FLONASE) 50 MCG/ACT nasal spray Place 2 sprays into both nostrils daily.    [provider]  ?ipratropium-albuterol (DUONEB) 0.5-2.5 (3) MG/3ML SOLN Take 3 mLs by nebulization every 6 (six) hours as needed. 06/29/18   Jetty Duhamel D, MD  ?montelukast (SINGULAIR) 10 MG tablet TAKE 1 TABLET BY MOUTH EVERY DAY 11/26/17   Jetty Duhamel D, MD  ?Multiple Vitamin (MULTIVITAMIN WITH MINERALS) TABS tablet Take 1 tablet by mouth daily.    [provider]  ?Nebulizers (COMPRESSOR/NEBULIZER) MISC Use as directed 06/08/18   Waymon Budge, MD  ?predniSONE (DELTASONE) 10 MG tablet 4 tabs for 2 days, then 3 tabs for 2 days, 2 tabs for 2 days, then 1 tab for 2 days, then stop 06/29/21   Parrett, Virgel Bouquet, NP  ?promethazine-codeine (PHENERGAN WITH CODEINE) 6.25-10 MG/5ML syrup Take 5 mLs by mouth every 6 (six) hours as needed for cough. 08/09/20   Waymon Budge, MD  ?venlafaxine (EFFEXOR) 75 MG tablet Take 75 mg by mouth 3 (three) times daily with meals. Patient takes once a day    [provider]  ?   ? ?Allergies    ?Penicillins, Cefuroxime, Excedrin tension headache [acetaminophen-caffeine], Hydrocodone, Codeine, and Vicodin [hydrocodone-acetaminophen]   ? ?Review of Systems   ?Review of Systems ?Negative except as per HPI ?Physical Exam ?Updated Vital Signs ?BP 121/68   Pulse 68   Temp 98.3 ?F (36.8 ?C)   Resp 18  Ht 5\' 6"  (1.676 m)   Wt 81.6 kg   SpO2 94%   BMI 29.05 kg/m?  ?Physical Exam ?Vitals and nursing note reviewed.  ?Constitutional:   ?   General: She is not in acute distress. ?   Appearance: She is well-developed. She is not diaphoretic.  ?HENT:  ?   Head: Normocephalic and atraumatic.  ?Cardiovascular:  ?   Rate and Rhythm: Normal rate and regular rhythm.  ?   Heart sounds: Normal heart sounds.  ?Pulmonary:  ?   Effort: Pulmonary effort is normal.  ?   Breath sounds: Normal breath sounds.  ?Abdominal:  ?   Palpations: Abdomen is soft.  ?   Tenderness: There is  abdominal tenderness. There is no right CVA tenderness or left CVA tenderness.  ?Musculoskeletal:  ?   Right lower leg: No edema.  ?   Left lower leg: No edema.  ?Skin: ?   General: Skin is warm and dry.  ?   Findings: No erythema or rash.  ?Neurological:  ?   Mental Status: She is alert and oriented to person, place, and time.  ?Psychiatric:     ?   Behavior: Behavior normal.  ? ? ?ED Results / Procedures / Treatments   ?Labs ?(all labs ordered are listed, but only abnormal results are displayed) ?Labs Reviewed  ?URINALYSIS, ROUTINE W REFLEX MICROSCOPIC - Abnormal; Notable for the following components:  ?    Result Value  ? Hgb urine dipstick LARGE (*)   ? Protein, ur 30 (*)   ? Leukocytes,Ua MODERATE (*)   ? All other components within normal limits  ?CBC WITH DIFFERENTIAL/PLATELET - Abnormal; Notable for the following components:  ? WBC 14.4 (*)   ? Neutro Abs 10.6 (*)   ? All other components within normal limits  ?BASIC METABOLIC PANEL  ? ? ?EKG ?None ? ?Radiology ?CT Renal Stone Study ? ?Result Date: 07/06/2021 ?CLINICAL DATA:  Right flank pain.  Kidney stone suspected. EXAM: CT ABDOMEN AND PELVIS WITHOUT CONTRAST TECHNIQUE: Multidetector CT imaging of the abdomen and pelvis was performed following the standard protocol without IV contrast. RADIATION DOSE REDUCTION: This exam was performed according to the departmental dose-optimization program which includes automated exposure control, adjustment of the mA and/or kV according to patient size and/or use of iterative reconstruction technique. COMPARISON:  07/26/2020 FINDINGS: Lower chest: Minimal linear scarring at the lung bases. No active process. Hepatobiliary: Chronic cysts in the right lobe of the liver. No calcified gallstones. Pancreas: Normal Spleen: Normal Adrenals/Urinary Tract: Adrenal glands are normal. Kidneys are normal. No cyst, mass, stone or hydronephrosis. No sign of passing stone. No stone in the bladder. Numerous phleboliths in the pelvis.  Stomach/Bowel: Stomach and small intestine are normal. Normal appendix. Moderate amount of fecal matter within the colon. Diverticulosis without evidence of diverticulitis. Vascular/Lymphatic: Aortic atherosclerosis. No aneurysm. IVC is normal. No adenopathy. Reproductive: Previous hysterectomy.  No pelvic mass. Other: No free fluid or air. Musculoskeletal: Lumbar degenerative changes, most prominently facet arthropathy on the right at L3-4 which could relate to the patient's symptoms. IMPRESSION: No evidence of urinary tract stone disease. No abdominal abnormality seen to explain right sided flank to groin pain. Aortic atherosclerosis. Chronic benign cysts the right lobe of the liver. Diverticulosis without evidence of diverticulitis. Lumbar degenerative changes which could be a cause of back pain or referred pain. Facet arthropathy on the right at L3-4 in particular could possibly relate to the current presentation. Electronically Signed   By: 07/28/2020  [place] M.D.   On: 07/06/2021 14:48   ? ?Procedures ?Procedures  ? ? ?Medications Ordered in ED ?Medications  ?sodium chloride 0.9 % bolus 1,000 mL (1,000 mLs Intravenous New Bag/Given 07/06/21 1455)  ? ? ?ED Course/ Medical Decision Making/ A&P ?  ?                        ?Medical Decision Making ?Amount and/or Complexity of Data Reviewed ?Labs: ordered. ?Radiology: ordered. ? ? ?This patient presents to the ED for concern of dysuria, frequency, hematuria and feeling generally poorly., this involves an extensive number of treatment options, and is a complaint that carries with it a high risk of complications and morbidity.  The differential diagnosis includes but not limited to UTI, pyelonephritis, ureterolithiasis, diverticulitis ? ? ?Co morbidities that complicate the patient evaluation ? ?COPD, CVA, frequent urinary tract infections ? ? ?Additional history obtained: ? ?External records from outside source obtained and reviewed including prior CT abdomen pelvis from  07/26/2020 without renal stones at that time ? ? ?Lab Tests: ? ?I Ordered, and personally interpreted labs.  The pertinent results include: CBC with elevated white count at 14,000.  BMP with normal renal function.  Urin

## 2021-08-10 NOTE — Progress Notes (Signed)
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  FVC 1.62/45%, FEV1 1.12/40%, ratio 0.69, FEF 25-75% 0.69/27% ?------------------------------------------------------------------------- ? ? ?08/09/20-  66 yo female former smoker(50 pk yrs) followed for allergic rhinitis, COPD mixed type, complicated by embolic stroke RMCA/ ASD closed 2016, Covid infection Jan 2022. ?Enrolled in Lung Cancer Screening [place] program by Eric Form. ?-Bevespi, Singulair, albuterol HFA, Tessalon, neb Duoneb ?Covid vax-2 pHIZER ?Recent ED visit for abdominal pain ?Act score -20 ?Adjustable bed- helps to elevate head. Asks to refill cough syrup to hold- discussed. ?We had given Breztri samples ?She liked samples Breztri but has gone back to [place] until this visit.  ?Incidental Covid infection Jan 2022. Feels somewhat tired, denies snore. Unclear if related to Covid.  ?CXR 08/10/19- ?IMPRESSION: ?Stable moderate changes of chronic bronchitis and/or asthma. No ?acute cardiopulmonary disease. ? ?08/13/21- 66 yo female former smoker(50 pk yrs) followed for allergic rhinitis, COPD mixed type, complicated by embolic stroke RMCA/ ASD closed 2016, Covid infection Jan 2022. ?Was enrolled in [place] program by Eric Form- no longer eligible. ?-Breztri, Singulair, albuterol HFA, Tessalon, neb Duoneb ?Covid vax-2 Phizer ?Flu vax-had ?NP Parrett visit in March for respiratory infection> D80, Zpak, prednisone ?Discussed frequent need for prednisone over the past couple of years.  Mowing the lawn has been particular trigger despite wearing mask.  We discussed Biologic injection therapies and we are going to check IgE and eosinophil counts.  Asks for codeine  type cough syrup same Perles do not help.  Hydrocodone listed as allergy but she says it only causes some queasiness if she takes it on an empty stomach. ?CXR 04/17/21  ?IMPRESSION: ?No evidence of active cardiopulmonary process. ? ?                ?ROS-see HPI   + = positive ?Constitutional:   No-   weight loss, night sweats, fevers, chills, fatigue, lassitude. ?HEENT:   + headaches, difficulty swallowing, tooth/dental problems, sore throat,  ?     No-  sneezing, itching, ear ache,  +nasal congestion, post nasal drip,  ?CV:  No-   chest pain, orthopnea, PND, swelling in lower extremities, anasarca, dizziness, palpitations ?Resp:   shortness of breath with exertion or at rest.   ?              productive cough,  No non-productive cough,  No- coughing up of blood.   ?            change in color of mucus.   +wheezing.   ?Skin: No-   rash or lesions. ?GI:  No-   heartburn, indigestion, abdominal pain, nausea, vomiting, ?GU:  ?MS:  No-   joint pain or swelling.   ?Neuro-     nothing unusual ?Psych:  No- change in mood or affect. No depression or anxiety.  No memory loss. ? ?OBJ- Physical Exam   ?General- Alert, Oriented, Affect-appropriate, Distress- none acute ?Skin- rash-none, lesions- none, excoriation- none ?Lymphadenopathy- none ?Head- atraumatic ?           Eyes- Gross vision intact, PERRLA, conjunctivae and secretions clear ?           Ears- Hearing, canals-normal ?           Nose- + turbinate edema, no-Septal dev, mucus, polyps,  erosion, perforation  ?           Throat- Mallampati II , mucosa cobblestoned , drainage- none, tonsils- atrophic, ?           Neck- flexible , trachea midline, no stridor , thyroid nl, carotid no bruit ?Chest - symmetrical excursion , unlabored ?          Heart/CV- RRR , no murmur , no gallop  , no rub, nl s1 s2 ?                          - JVD- none , edema- none, stasis changes- none, varices- none ?          Lung- + diminished / clear, wheeze- none, cough- none , dullness-none, rub-  none ?          Chest wall-  ?Abd-  ?Br/ Gen/ Rectal- Not done, not indicated ?Extrem- cyanosis- none, clubbing, none, atrophy- none, strength- nl ?Neuro- grossly intact to observation ? ? ? ? ? ? ?

## 2021-08-13 ENCOUNTER — Ambulatory Visit (INDEPENDENT_AMBULATORY_CARE_PROVIDER_SITE_OTHER): Payer: BC Managed Care – PPO | Admitting: Internal Medicine

## 2021-08-13 ENCOUNTER — Encounter: Payer: Self-pay | Admitting: Internal Medicine

## 2021-08-13 ENCOUNTER — Telehealth: Payer: Self-pay | Admitting: Internal Medicine

## 2021-08-13 VITALS — BP 126/80 | HR 75 | Temp 98.1°F | Ht 66.0 in | Wt 193.0 lb

## 2021-08-13 DIAGNOSIS — J4540 Moderate persistent asthma, uncomplicated: Secondary | ICD-10-CM | POA: Diagnosis not present

## 2021-08-13 DIAGNOSIS — J449 Chronic obstructive pulmonary disease, unspecified: Secondary | ICD-10-CM

## 2021-08-13 DIAGNOSIS — J302 Other seasonal allergic rhinitis: Secondary | ICD-10-CM | POA: Diagnosis not present

## 2021-08-13 DIAGNOSIS — J3089 Other allergic rhinitis: Secondary | ICD-10-CM | POA: Diagnosis not present

## 2021-08-13 LAB — CBC WITH DIFFERENTIAL/PLATELET
Basophils Absolute: 0 10*3/uL (ref 0.0–0.1)
Basophils Relative: 0.6 % (ref 0.0–3.0)
Eosinophils Absolute: 0.3 10*3/uL (ref 0.0–0.7)
Eosinophils Relative: 4 % (ref 0.0–5.0)
HCT: 40.7 % (ref 36.0–46.0)
Hemoglobin: 13.4 g/dL (ref 12.0–15.0)
Lymphocytes Relative: 18.8 % (ref 12.0–46.0)
Lymphs Abs: 1.5 10*3/uL (ref 0.7–4.0)
MCHC: 33 g/dL (ref 30.0–36.0)
MCV: 93.7 fl (ref 78.0–100.0)
Monocytes Absolute: 0.6 10*3/uL (ref 0.1–1.0)
Monocytes Relative: 8.1 % (ref 3.0–12.0)
Neutro Abs: 5.5 10*3/uL (ref 1.4–7.7)
Neutrophils Relative %: 68.5 % (ref 43.0–77.0)
Platelets: 222 10*3/uL (ref 150.0–400.0)
RBC: 4.34 Mil/uL (ref 3.87–5.11)
RDW: 14.2 % (ref 11.5–15.5)
WBC: 8 10*3/uL (ref 4.0–10.5)

## 2021-08-13 MED ORDER — FLUTICASONE PROPIONATE 50 MCG/ACT NA SUSP: 2.0000 | Freq: Every day | NASAL | 12 refills | Status: AC

## 2021-08-13 MED ORDER — IPRATROPIUM-ALBUTEROL 0.5-2.5 (3) MG/3ML IN SOLN
3.0000 mL | Freq: Four times a day (QID) | RESPIRATORY_TRACT | 12 refills | Status: AC | PRN
Start: 2021-08-13 — End: ?

## 2021-08-13 MED ORDER — HYDROCODONE BIT-HOMATROP MBR 5-1.5 MG/5ML PO SOLN: 5.0000 mL | Freq: Four times a day (QID) | ORAL | 0 refills | Status: DC | PRN

## 2021-08-13 NOTE — Assessment & Plan Note (Addendum)
Moderate asthma component with bronchospasm flares occasionally requiring prednisone.  She might benefit from a Biologic. ?Plan-Labs for IgE and eosinophil counts.  Hycodan to hold.  Asks replacement for old compressor nebulizer machine. ?

## 2021-08-13 NOTE — Telephone Encounter (Signed)
Upon review, it looks like she has quit smoking over 15 years so she is not eligible for [facility] LDCT.  She could ask provider for recommendation for CT Chest wo contrast, as an alternative if she is interested in a scan.    ?

## 2021-08-13 NOTE — Telephone Encounter (Signed)
Please see response from lung cancer screening team ?

## 2021-08-13 NOTE — Assessment & Plan Note (Signed)
Asked refill Flonase but has not had a serious problem with spring pollen this year as long as she wears a mask when working outdoors. ?

## 2021-08-13 NOTE — Patient Instructions (Signed)
Order- replacement compressor nebulizer   dx asthma moderate persistent ? ?          Script Duoneb neb solution 360 ml    ref prn  1 neb every 6 hours if needed ? ? ?Script sent refilling Flonase ? ?Script sent for hycodan cough syrup ? ?Order- lab- IgE, CBC w diff      dx asthma moderate persistent ?

## 2021-08-13 NOTE — Telephone Encounter (Signed)
Patient came into the office for OV with Dr. Annamaria Boots and he asked me to check with you guys to see if she is still in the lung cancer screening program. He said she has not had a scan since 2019.  ? ? ?Please advise ?

## 2021-08-15 LAB — IGE: IgE (Immunoglobulin E), Serum: 44 kU/L (ref <=114)

## 2021-08-23 ENCOUNTER — Ambulatory Visit (INDEPENDENT_AMBULATORY_CARE_PROVIDER_SITE_OTHER): Payer: BC Managed Care – PPO | Admitting: Adult Health

## 2021-08-23 ENCOUNTER — Ambulatory Visit (INDEPENDENT_AMBULATORY_CARE_PROVIDER_SITE_OTHER): Payer: BC Managed Care – PPO

## 2021-08-23 ENCOUNTER — Encounter: Payer: Self-pay | Admitting: Adult Health

## 2021-08-23 VITALS — BP 130/76 | HR 100 | Temp 98.2°F | Ht 66.0 in | Wt 192.8 lb

## 2021-08-23 DIAGNOSIS — J449 Chronic obstructive pulmonary disease, unspecified: Secondary | ICD-10-CM

## 2021-08-23 MED ORDER — METHYLPREDNISOLONE ACETATE 80 MG/ML IJ SUSP
80.0000 mg | Freq: Once | INTRAMUSCULAR | Status: AC
  Administered 2021-08-23: 80 mg via INTRAMUSCULAR

## 2021-08-23 MED ORDER — ALBUTEROL SULFATE (2.5 MG/3ML) 0.083% IN NEBU
2.5000 mg | INHALATION_SOLUTION | Freq: Four times a day (QID) | RESPIRATORY_TRACT | Status: AC | PRN
  Administered 2021-08-23: 2.5 mg via RESPIRATORY_TRACT

## 2021-08-23 NOTE — Progress Notes (Signed)
@Patient  ID: Kim Seltzeronnie B Decker, female    DOB: [birth year], 66 y.o.   MRN: [id number]  Chief Complaint  Patient presents with   Follow-up    Referring provider: [name], [name], *  HPI: 66 year old female former smoker followed for COPD with asthma  and allergic rhinitis COVID-19 infection January 2022 Medical history significant for embolic stroke in 2016  TEST/EVENTS :  PFT 11/02/13- mild obstructive airways disease with insignificant response to bronchodilator  Office Spirometry 07/10/17-severe obstructive airways disease with restriction of exhaled volume.  FVC 1.62/45%, FEV1 1.12/40%, ratio 0.69, FEF 25-75% 0.69/27%  07/2021 IgE 44 , Eosinophils 300    08/23/2021 Acute OV : COPD  Patient presents for an acute office visit.  Patient complains that she was eating cake 2 days ago.  She got choked on a small piece that hit the back of her throat.  Since then she has had excessive coughing.  Since yesterday she started coughing up thick mucus with occasional green.  She denies any fever.  No body aches..  She says she has been unable to use her nebulizer as it has not arrived yet.  Patient says she just cannot stop coughing.  She tried some over-the-counter products but is not working.  She remains on Breztri twice daily.  She denies any This morning  has some intermittent wheezing and shortness of breath. Appetite is good with no nausea vomiting or diarrhea.  She request a Depo-Medrol injection.  Patient education on steroids given  Allergies  Allergen Reactions   Penicillins Swelling   Cefuroxime Swelling   Excedrin Tension Headache [Acetaminophen-Caffeine] Swelling   Hydrocodone Nausea Only   Codeine Nausea Only   Vicodin [Hydrocodone-Acetaminophen]     Upset stomach    Immunization History  Administered Date(s) Administered   Influenza Split 12/31/2010   Influenza Whole 12/30/2008   Influenza,inj,Quad PF,6+ Mos 02/16/2014, 12/23/2014, 01/01/2016, 01/11/2020,  01/09/2021   Influenza-Unspecified 11/30/2012, 02/13/2014, 12/08/2017   Pneumococcal Conjugate-13 05/16/2014   Pneumococcal Polysaccharide-23 04/01/2006   Pneumococcal-Unspecified 12/01/2007    Past Medical History:  Diagnosis Date   Acute maxillary sinusitis 12/08/2017   ADHD (attention deficit hyperactivity disorder)    ALLERGIC RHINITIS    Asthma    ASTHMA 02/27/2009   Qualifier: Diagnosis of  By: Roxan Hockeyobinson CMA, Jessica     Atrial septal defect    prior atrial septal defect stable following closure with amplatzer device   Classic migraine 07/14/2014   Claustrophobia    unable to ride in elavator;leave room door open   COPD (chronic obstructive pulmonary disease) (HCC)    COPD mixed type (HCC) 05/17/2014   PFT 11/02/13- mild obstructive airways disease with insignificant response to bronchodilator    COPD with acute bronchitis (HCC) 03/27/2009   Qualifier: Diagnosis of  By: Maple HudsonYoung MD, Clinton D    CVA (cerebral infarction) 07/14/2014   Depression with anxiety 01/16/2015   Fatigue    of uncertain etiology   Former smoker 03/20/2017   Gait disturbance, 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   History of frequent urinary tract infections    Migraine headache    since childhood   Numbness 07/14/2014   Other headache syndrome 09/12/2014   Overweight    Patent foramen ovale 07/12/2014   PONV (postoperative nausea and vomiting)    Pt states Zofran does not relieve sx; prefers Phenergan   Post-menopause on HRT (hormone replacement therapy)    Restless leg    Seasonal and perennial allergic rhinitis 02/27/2009   Qualifier: Diagnosis of  By:  Roxan Hockey CMA, Shanda Bumps     Stroke [facility])    Stroke occurring within last month 07/12/2014   Upper airway cough syndrome 04/29/2015   Trial of max gerd rx 04/28/2015 >>>       URI 03/13/2009   Qualifier: Diagnosis of  By: Maple Hudson MD, Clinton D     Tobacco History: Social History   Tobacco Use  Smoking Status Former   Packs/day: 2.00   Years: 25.00   Pack years:  50.00   Types: Cigarettes   Quit date: 04/02/2003   Years since quitting: 18.4  Smokeless Tobacco Never   Counseling given: Not Answered   Outpatient Medications Prior to Visit  Medication Sig Dispense Refill   acetaminophen (TYLENOL) 325 MG tablet Take 650 mg by mouth every 6 (six) hours as needed for headache.     albuterol (PROVENTIL HFA) 108 (90 Base) MCG/ACT inhaler Inhale 2 puffs into the lungs every 4 (four) hours as needed for wheezing or shortness of breath. ProAir 18 g 12   aspirin EC 81 MG tablet Take 81 mg by mouth daily.     benzonatate (TESSALON) 200 MG capsule Take 1 capsule (200 mg total) by mouth 3 (three) times daily as needed for cough. 30 capsule 12   Budeson-Glycopyrrol-Formoterol (BREZTRI AEROSPHERE) 160-9-4.8 MCG/ACT AERO Inhale 2 puffs then rinse mouth, twice daily 10.7 g 12   clonazePAM (KLONOPIN) 2 MG tablet Take 2 mg by mouth at bedtime. For restless legs  0   fluticasone (FLONASE) 50 MCG/ACT nasal spray Place 2 sprays into both nostrils daily. 16 g 12   HYDROcodone bit-homatropine (HYCODAN) 5-1.5 MG/5ML syrup Take 5 mLs by mouth every 6 (six) hours as needed for cough. 240 mL 0   ipratropium-albuterol (DUONEB) 0.5-2.5 (3) MG/3ML SOLN Take 3 mLs by nebulization every 6 (six) hours as needed. 75 mL 12   montelukast (SINGULAIR) 10 MG tablet TAKE 1 TABLET BY MOUTH EVERY DAY 90 tablet 3   Multiple Vitamin (MULTIVITAMIN WITH MINERALS) TABS tablet Take 1 tablet by mouth daily.     Nebulizers (COMPRESSOR/NEBULIZER) MISC Use as directed 1 each 0   nitrofurantoin, macrocrystal-monohydrate, (MACROBID) 100 MG capsule Take 1 capsule (100 mg total) by mouth 2 (two) times daily. 10 capsule 0   venlafaxine (EFFEXOR) 75 MG tablet Take 75 mg by mouth 3 (three) times daily with meals. Patient takes once a day     No facility-administered medications prior to visit.     Review of Systems:   Constitutional:   No  weight loss, night sweats,  Fevers, chills, fatigue, or   lassitude.  HEENT:   No headaches,  Difficulty swallowing,  Tooth/dental problems, or  Sore throat,                No sneezing, itching, ear ache,  +nasal congestion, post nasal drip,   CV:  No chest pain,  Orthopnea, PND, swelling in lower extremities, anasarca, dizziness, palpitations, syncope.   GI  No heartburn, indigestion, abdominal pain, nausea, vomiting, diarrhea, change in bowel habits, loss of appetite, bloody stools.   Resp: .  No chest wall deformity  Skin: no rash or lesions.  GU: no dysuria, change in color of urine, no urgency or frequency.  No flank pain, no hematuria   MS:  No joint pain or swelling.  No decreased range of motion.  No back pain.    Physical Exam  BP 130/76 (BP Location: Left Arm, Cuff Size: Normal)   Pulse 100   Temp  98.2 F (36.8 C)   Ht 5\' 6"  (1.676 m)   Wt 192 lb 12.8 oz (87.5 kg)   SpO2 91%   BMI 31.12 kg/m   GEN: A/Ox3; pleasant , NAD, well nourished    HEENT:  Startup/AT,   NOSE-clear, THROAT-clear, no lesions, no postnasal drip or exudate noted.   NECK:  Supple w/ fair ROM; no JVD; normal carotid impulses w/o bruits; no thyromegaly or nodules palpated; no lymphadenopathy.    RESP  Clear  P & A; w/o, wheezes/ rales/ or rhonchi. no accessory muscle use, no dullness to percussion  CARD:  RRR, no m/r/g, no peripheral edema, pulses intact, no cyanosis or clubbing.  GI:   Soft & nt; nml bowel sounds; no organomegaly or masses detected.   Musco: Warm bil, no deformities or joint swelling noted.   Neuro: alert, no focal deficits noted.    Skin: Warm, no lesions or rashes    Lab Results:  CBC    Component Value Date/Time   WBC 8.0 08/13/2021 1016   RBC 4.34 08/13/2021 1016   HGB 13.4 08/13/2021 1016   HCT 40.7 08/13/2021 1016   PLT 222.0 08/13/2021 1016   MCV 93.7 08/13/2021 1016   MCH 29.6 07/06/2021 1455   MCHC 33.0 08/13/2021 1016   RDW 14.2 08/13/2021 1016   LYMPHSABS 1.5 08/13/2021 1016   MONOABS 0.6 08/13/2021 1016    EOSABS 0.3 08/13/2021 1016   BASOSABS 0.0 08/13/2021 1016    BMET    Component Value Date/Time   NA 140 07/06/2021 1455   NA 142 08/23/2019 0854   K 3.8 07/06/2021 1455   CL 102 07/06/2021 1455   CO2 31 07/06/2021 1455   GLUCOSE 97 07/06/2021 1455   BUN 16 07/06/2021 1455   BUN 16 08/23/2019 0854   CREATININE 0.73 07/06/2021 1455   CALCIUM 9.0 07/06/2021 1455   GFRNONAA >60 07/06/2021 1455   GFRAA 79 08/23/2019 0854    BNP No results found for: BNP  ProBNP No results found for: PROBNP  Imaging: No results found.  methylPREDNISolone acetate (DEPO-MEDROL) injection 80 mg     Date Action Dose Route User   Discharged on 07/06/2021   Admitted on 07/06/2021   06/29/2021 1717 Given 80 mg Intramuscular (Left Upper Outer Quadrant) Mabe, 07/01/2021, CMA          Latest Ref Rng & Units 11/02/2013    2:55 PM  PFT Results  FVC-Pre L 2.48    FVC-Predicted Pre % 66    FVC-Post L 2.81    FVC-Predicted Post % 75    Pre FEV1/FVC % % 71    Post FEV1/FCV % % 66    FEV1-Pre L 1.75    FEV1-Predicted Pre % 60    FEV1-Post L 1.86      No results found for: NITRICOXIDE      Assessment & Plan:   No problem-specific Assessment & Plan notes found for this encounter.     01/02/2014, NP 08/23/2021

## 2021-08-23 NOTE — Patient Instructions (Signed)
Mucinex liquid 1-2 tsp Twice daily  for cough/congestion  Delsym 2 tsp Twice daily   Tessalon Three times a day   Duoneb neb As needed  (call if neb machine does not arrive soon)  Continue on Breztri 2 puffs Twice daily, rinse after use.  Chest xray today .  Follow up with Dr. Annamaria Boots in 3 months and As needed   Please contact office for sooner follow up if symptoms do not improve or worsen or seek emergency care

## 2021-08-24 NOTE — Assessment & Plan Note (Signed)
COPD flare after getting choked on a piece of cake.  No obvious signs of infection.  We will treat for COPD flare.  Depo-Medrol 80 mg IM injection today in office.  Albuterol nebulizer given today as well.  Check chest x-ray.  Plan  Patient Instructions  Mucinex liquid 1-2 tsp Twice daily  for cough/congestion  Delsym 2 tsp Twice daily   Tessalon Three times a day   Duoneb neb As needed  (call if neb machine does not arrive soon)  Continue on Breztri 2 puffs Twice daily, rinse after use.  Chest xray today .  Follow up with Dr. Maple Hudson in 3 months and As needed   Please contact office for sooner follow up if symptoms do not improve or worsen or seek emergency care

## 2021-08-28 ENCOUNTER — Telehealth: Payer: Self-pay | Admitting: Adult Health

## 2021-08-28 NOTE — Telephone Encounter (Signed)
Glad she is better , keep taking mucinex and delsym  It will take a while to resolve Please contact office for sooner follow up if symptoms do not improve or worsen or seek emergency care

## 2021-08-28 NOTE — Telephone Encounter (Signed)
Call made to patient, confirmed DOB. Made aware of CXR results. Voiced understanding. Patient states she is feeling much better however she has taken a whole box of mucinex and she still feels congested. She is wanting to know if there is anything else she can do for her congestions. Reports all other symptoms have gotten better.   TP please advise. Thanks :)

## 2021-08-28 NOTE — Progress Notes (Signed)
ATC x1.  No answer.  VM full.

## 2021-08-29 NOTE — Telephone Encounter (Signed)
Called and notified patient of response and she voiced understanding. Nothing further needed at this time.  

## 2021-09-03 ENCOUNTER — Telehealth: Payer: Self-pay | Admitting: *Deleted

## 2021-09-03 NOTE — Telephone Encounter (Signed)
While calling results to patient, she stated she had not received her nebulizer machine that was ordered back on 08/13/21.  Advised her I would reach out to our contact at [employer] and have them give her a call.  She verbalized understanding.  Called and spoke with Danielle with [employer].  She states she will call and speak with patient as she will be private pay for the machine as her account is on some type of payment plan.  She will call and speak with patient.  Nothing further needed.

## 2021-09-03 NOTE — Progress Notes (Signed)
Called and spoke with patient, advised of results/recommendations per Rubye Oaks NP.  She verbalized understanding.  Nothing further need.

## 2021-11-11 NOTE — Progress Notes (Signed)
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  FVC 1.62/45%, FEV1 1.12/40%, ratio 0.69, FEF 25-75% 0.69/27% Eos 08/13/21- IgE 44 , Eosinophils 300    -------------------------------------------------------------------------   08/13/21- 66 yo female former smoker(50 pk yrs) followed for allergic rhinitis, COPD mixed type, complicated by embolic stroke RMCA/ ASD closed 2016, Covid infection Jan 2022. Was enrolled in [facility] [place] program by Kandice Robinsons- no longer eligible. Markus Daft, Singulair, albuterol HFA, Tessalon, neb Duoneb Covid vax-2 Phizer Flu vax-had NP [place] visit in March for respiratory infection> D80, Zpak, prednisone Discussed frequent need for prednisone over the past couple of years.  Mowing the lawn has been particular trigger despite wearing mask.  We discussed Biologic injection therapies and we are going to check IgE and eosinophil counts.  Asks for codeine type cough syrup same Perles do not help.  Hydrocodone listed as allergy but she says it only causes some queasiness if she takes it on an empty stomach. CXR 04/17/21  IMPRESSION: No evidence of active cardiopulmonary process.  11/13/21- 66 yo female former smoker(50 pk yrs) followed for allergic rhinitis, COPD mixed type, complicated by embolic stroke RMCA/ ASD closed 2016, Covid infection Jan 2022. Was enrolled in Lung Cancer Screening [facility] program by Kandice Robinsons- no longer eligible. -Breztri, Singulair, albuterol HFA, Tessalon, neb Duoneb Covid vax-2 Phizer Eos 08/13/21- IgE 44 , Eosinophils 300  Acute OV 5/25- TP, NP-persistent cough after choking on cake.> depo 80, Neb Duoneb Misunderstanding- didn't get sleep study- being  rescheduled. Does not want CPAP- claustrophobic. Husband has Inspire. Breathing ok right now, but DOE which she mows yard on weekends triggers cough.  Then she needs to use her inhaler for a few days. CXR 08/23/21- IMPRESSION: No active cardiopulmonary disease.                 ROS-see HPI   + = positive Constitutional:   No-   weight loss, night sweats, fevers, chills, fatigue, lassitude. HEENT:   + headaches, difficulty swallowing, tooth/dental problems, sore throat,       No-  sneezing, itching, ear ache,  +nasal congestion, post nasal drip,  CV:  No-   chest pain, orthopnea, PND, swelling in lower extremities, anasarca, dizziness, palpitations Resp:   shortness of breath with exertion or at rest.                 productive cough,  No non-productive cough,  No- coughing up of blood.               change in color of mucus.   +wheezing.   Skin: No-   rash or lesions. GI:  No-   heartburn, indigestion, abdominal pain, nausea, vomiting, GU:  MS:  No-   joint pain or swelling.   Neuro-     nothing unusual Psych:  No- change in mood or affect. No depression or anxiety.  No memory loss.  OBJ- Physical Exam   General- Alert, Oriented, Affect-appropriate, Distress- none acute, +obese Skin- rash-none, lesions- none, excoriation- none Lymphadenopathy- none Head- atraumatic            Eyes- Gross vision intact, PERRLA, conjunctivae and secretions clear            Ears- Hearing, canals-normal  Nose- + turbinate edema, no-Septal dev, mucus, polyps, erosion, perforation             Throat- Mallampati II , mucosa cobblestoned , drainage- none, tonsils- atrophic,            Neck- flexible , trachea midline, no stridor , thyroid nl, carotid no bruit Chest - symmetrical excursion , unlabored           Heart/CV- RRR , no murmur , no gallop  , no rub, nl s1 s2                           - JVD- none , edema- none, stasis changes- none, varices- none           Lung- + diminished / clear, wheeze-  none, cough- none , dullness-none, rub- none           Chest wall-  Abd-  Br/ Gen/ Rectal- Not done, not indicated Extrem- cyanosis- none, clubbing, none, atrophy- none, strength- nl Neuro- grossly intact to observation

## 2021-11-13 ENCOUNTER — Ambulatory Visit (INDEPENDENT_AMBULATORY_CARE_PROVIDER_SITE_OTHER): Payer: BC Managed Care – PPO | Admitting: Internal Medicine

## 2021-11-13 ENCOUNTER — Encounter: Payer: Self-pay | Admitting: Internal Medicine

## 2021-11-13 VITALS — BP 104/70 | Ht 66.0 in | Wt 193.8 lb

## 2021-11-13 DIAGNOSIS — J449 Chronic obstructive pulmonary disease, unspecified: Secondary | ICD-10-CM

## 2021-11-13 DIAGNOSIS — R0683 Snoring: Secondary | ICD-10-CM | POA: Diagnosis not present

## 2021-11-13 DIAGNOSIS — R400 Somnolence: Secondary | ICD-10-CM | POA: Diagnosis not present

## 2021-11-13 DIAGNOSIS — R29818 Other symptoms and signs involving the nervous system: Secondary | ICD-10-CM

## 2021-11-13 NOTE — Patient Instructions (Signed)
Order- schedule NPSG sleep study at [facility] dx   somnolence, snoring  You can call us for results and recommendations about 2 weeks after your study. We can talk about alternatives to CPAP.  Ok to continue current breathing meds.

## 2021-12-05 ENCOUNTER — Encounter: Payer: Self-pay | Admitting: Internal Medicine

## 2021-12-05 DIAGNOSIS — R29818 Other symptoms and signs involving the nervous system: Secondary | ICD-10-CM | POA: Insufficient documentation

## 2021-12-05 NOTE — Assessment & Plan Note (Signed)
Appropriate use of Breztri and rescue inhaler Nebulizer is available when needed. Plan-continue current meds

## 2021-12-05 NOTE — Assessment & Plan Note (Signed)
We are going to reschedule diagnostic sleep study for suspected sleep apnea with snoring and daytime sleepiness. She is resistant to the idea of CPAP, citing claustrophobia.  We will need to look at alternatives.

## 2021-12-24 ENCOUNTER — Other Ambulatory Visit: Payer: Self-pay | Admitting: Family Medicine

## 2021-12-24 DIAGNOSIS — M79606 Pain in leg, unspecified: Secondary | ICD-10-CM

## 2022-01-07 ENCOUNTER — Ambulatory Visit: Payer: BC Managed Care – PPO | Admitting: Obstetrics and Gynecology

## 2022-01-13 NOTE — Progress Notes (Unsigned)
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  FVC 1.62/45%, FEV1 1.12/40%, ratio 0.69, FEF 25-75% 0.69/27% Eos 08/13/21- IgE 44 , Eosinophils 300    -------------------------------------------------------------------------   11/13/21- 66 yo female former smoker(50 pk yrs) followed for allergic rhinitis, COPD mixed type, complicated by embolic stroke RMCA/ ASD closed 2016, Covid infection Jan 2022. Was enrolled in [place] program by Eric Form- no longer eligible. -Breztri, Singulair, albuterol HFA, Tessalon, neb Duoneb Covid vax-2 Phizer Eos 08/13/21- IgE 44 , Eosinophils 300  Acute OV 5/25- TP, NP-persistent cough after choking on cake.> depo 80, Neb Duoneb Misunderstanding- didn't get sleep study- being rescheduled. Does not want CPAP- claustrophobic. Husband has Inspire. Breathing ok right now, but DOE which she mows yard on weekends triggers cough.  Then she needs to use her inhaler for a few days. CXR 08/23/21- IMPRESSION: No active cardiopulmonary disease.  01/15/22-   66 yo female former smoker(50 pk yrs) followed for allergic Rhinitis, COPD mixed type, complicated by embolic stroke RMCA/ ASD closed 2016, Covid infection Jan 2022. Was enrolled in [place] program by Eric Form- no longer eligible. -Breztri, Singulair, albuterol HFA, Tessalon, neb Duoneb Covid vax-2 Phizer   Flu vax-standard today Sleep study pending 10/24 ACT score- 20 We reviewed plans for upcoming sleep study.  She has not wanted CPAP.  We agreed to see what the test results look like and discuss options then. She is comfortable with her inhalers.  Sometimes feels a little "stuffy" in her chest  without cough or wheeze.  Mild seasonal nasal drainage.  No significant exacerbation.        ROS-see HPI   + = positive Constitutional:   No-   weight loss, night sweats, fevers, chills, fatigue, lassitude. HEENT:   + headaches, difficulty swallowing, tooth/dental problems, sore throat,       No-  sneezing, itching, ear ache,  +nasal congestion, post nasal drip,  CV:  No-   chest pain, orthopnea, PND, swelling in lower extremities, anasarca, dizziness, palpitations Resp:   shortness of breath with exertion or at rest.                 productive cough,  No non-productive cough,  No- coughing up of blood.               change in color of mucus.   +wheezing.   Skin: No-   rash or lesions. GI:  No-   heartburn, indigestion, abdominal pain, nausea, vomiting, GU:  MS:  No-   joint pain or swelling.   Neuro-     nothing unusual Psych:  No- change in mood or affect. No depression or anxiety.  No memory loss.  OBJ- Physical Exam   General- Alert, Oriented, Affect-appropriate, Distress- none acute, +obese Skin- rash-none, lesions- none, excoriation- none Lymphadenopathy- none Head- atraumatic            Eyes- Gross vision intact, PERRLA, conjunctivae and secretions clear            Ears- Hearing, canals-normal            Nose- + turbinate edema, no-Septal dev, mucus, polyps, erosion, perforation             Throat- Mallampati II , mucosa -, drainage- none, tonsils-  atrophic,            Neck- flexible , trachea midline, no stridor , thyroid nl, carotid no bruit Chest - symmetrical excursion , unlabored           Heart/CV- RRR , no murmur , no gallop  , no rub, nl s1 s2                           - JVD- none , edema- none, stasis changes- none, varices- none           Lung- + diminished / clear, wheeze- none, cough- none , dullness-none, rub- none           Chest wall-  Abd-  Br/ Gen/ Rectal- Not done, not indicated Extrem- cyanosis- none, clubbing, none, atrophy- none, strength- nl Neuro-  grossly intact to observation

## 2022-01-15 ENCOUNTER — Encounter: Payer: Self-pay | Admitting: Internal Medicine

## 2022-01-15 ENCOUNTER — Ambulatory Visit (INDEPENDENT_AMBULATORY_CARE_PROVIDER_SITE_OTHER): Payer: BC Managed Care – PPO | Admitting: Internal Medicine

## 2022-01-15 VITALS — BP 128/86 | HR 79 | Ht 66.0 in | Wt 192.6 lb

## 2022-01-15 DIAGNOSIS — J449 Chronic obstructive pulmonary disease, unspecified: Secondary | ICD-10-CM | POA: Diagnosis not present

## 2022-01-15 DIAGNOSIS — R29818 Other symptoms and signs involving the nervous system: Secondary | ICD-10-CM | POA: Diagnosis not present

## 2022-01-15 DIAGNOSIS — Z23 Encounter for immunization: Secondary | ICD-10-CM

## 2022-01-15 NOTE — Patient Instructions (Signed)
We can continue current breathing meds.  Keep your sleep study appointment. We will have results about 2 weeks after it is done so you can check with [place] then and we can decide about options.

## 2022-01-15 NOTE — Assessment & Plan Note (Signed)
Currently well controlled without recent exacerbation.  Medications discussed. Plan-continue current meds.  Standard flu shot today.

## 2022-01-15 NOTE — Assessment & Plan Note (Signed)
NPSG pending.  She will be able to call for results about 2 weeks later.  She is pretty clear that she does not want CPAP but is willing to consider alternatives depending on results of sleep study.

## 2022-01-22 ENCOUNTER — Ambulatory Visit (HOSPITAL_BASED_OUTPATIENT_CLINIC_OR_DEPARTMENT_OTHER): Payer: BC Managed Care – PPO | Admitting: Internal Medicine

## 2022-01-28 ENCOUNTER — Telehealth: Payer: Self-pay | Admitting: Internal Medicine

## 2022-01-28 MED ORDER — AZITHROMYCIN 250 MG PO TABS: ORAL_TABLET | ORAL | 0 refills | Status: DC

## 2022-01-28 MED ORDER — PREDNISONE 10 MG PO TABS: ORAL_TABLET | ORAL | 0 refills | Status: AC

## 2022-01-28 NOTE — Telephone Encounter (Signed)
Called and spoke with pt letting her know recs per CY and she verbalized understanding. Meds have been sent to preferred pharmacy. Nothing further needed.

## 2022-01-28 NOTE — Addendum Note (Signed)
Addended by: Lorretta Harp on: 01/28/2022 09:22 AM   Modules accepted: Orders

## 2022-01-28 NOTE — Telephone Encounter (Signed)
Zpak 250 mg, # 6, 2 today then one daily  Prednisone 10 mg, # 20, 4 X 2 DAYS, 3 X 2 DAYS, 2 X 2 DAYS, 1 X 2 DAYS  

## 2022-01-28 NOTE — Telephone Encounter (Signed)
Called and spoke with pt who states tested positive for covid 10/24 and was prescribed palovid by PCP. She finished the paxlovid 10/29. Stated she is still coughing getting up phlegm that is green at times and thick/sticky in consistency. Pt also states she has been wheezing.  Pt is using her breztri as prescribed. States that she has not used her rescue inhaler nor nebulizer solution.  Due to her still having symptoms, pt is wanting recommendations if meds could be prescribed to help with her symptoms. Dr. Annamaria Boots, please advise.   Allergies  Allergen Reactions   Penicillins Swelling   Cefuroxime Swelling   Excedrin Tension Headache [Acetaminophen-Caffeine] Swelling   Hydrocodone Nausea Only   Codeine Nausea Only   Vicodin [Hydrocodone-Acetaminophen]     Upset stomach     Current Outpatient Medications:    acetaminophen (TYLENOL) 325 MG tablet, Take 650 mg by mouth every 6 (six) hours as needed for headache., Disp: , Rfl:    albuterol (PROVENTIL HFA) 108 (90 Base) MCG/ACT inhaler, Inhale 2 puffs into the lungs every 4 (four) hours as needed for wheezing or shortness of breath. ProAir, Disp: 18 g, Rfl: 12   aspirin EC 81 MG tablet, Take 81 mg by mouth daily., Disp: , Rfl:    benzonatate (TESSALON) 200 MG capsule, Take 1 capsule (200 mg total) by mouth 3 (three) times daily as needed for cough., Disp: 30 capsule, Rfl: 12   Budeson-Glycopyrrol-Formoterol (BREZTRI AEROSPHERE) 160-9-4.8 MCG/ACT AERO, Inhale 2 puffs then rinse mouth, twice daily, Disp: 10.7 g, Rfl: 12   clonazePAM (KLONOPIN) 2 MG tablet, Take 2 mg by mouth at bedtime. For restless legs, Disp: , Rfl: 0   fluticasone (FLONASE) 50 MCG/ACT nasal spray, Place 2 sprays into both nostrils daily., Disp: 16 g, Rfl: 12   HYDROcodone bit-homatropine (HYCODAN) 5-1.5 MG/5ML syrup, Take 5 mLs by mouth every 6 (six) hours as needed for cough., Disp: 240 mL, Rfl: 0   ipratropium-albuterol (DUONEB) 0.5-2.5 (3) MG/3ML SOLN, Take 3 mLs by  nebulization every 6 (six) hours as needed., Disp: 75 mL, Rfl: 12   montelukast (SINGULAIR) 10 MG tablet, TAKE 1 TABLET BY MOUTH EVERY DAY, Disp: 90 tablet, Rfl: 3   Multiple Vitamin (MULTIVITAMIN WITH MINERALS) TABS tablet, Take 1 tablet by mouth daily., Disp: , Rfl:    Nebulizers (COMPRESSOR/NEBULIZER) MISC, Use as directed, Disp: 1 each, Rfl: 0   OZEMPIC, 0.25 OR 0.5 MG/DOSE, 2 MG/3ML SOPN, Inject 0.5 mg into the skin once a week., Disp: , Rfl:    venlafaxine (EFFEXOR) 75 MG tablet, Take 75 mg by mouth 3 (three) times daily with meals. Patient takes once a day, Disp: , Rfl:   Current Facility-Administered Medications:    albuterol (PROVENTIL) (2.5 MG/3ML) 0.083% nebulizer solution 2.5 mg, 2.5 mg, Nebulization, Q6H PRN, Parrett, Tammy S, NP, 2.5 mg at 08/23/21 1018

## 2022-02-08 ENCOUNTER — Ambulatory Visit
Admission: RE | Admit: 2022-02-08 | Discharge: 2022-02-08 | Disposition: A | Discharge status: Home / Self Care | Payer: BC Managed Care – PPO | Source: Ambulatory Visit | Attending: Family Medicine | Admitting: Family Medicine

## 2022-02-08 ENCOUNTER — Other Ambulatory Visit: Payer: Self-pay | Admitting: Family Medicine

## 2022-02-08 DIAGNOSIS — R5383 Other fatigue: Secondary | ICD-10-CM

## 2022-02-08 DIAGNOSIS — B349 Viral infection, unspecified: Secondary | ICD-10-CM

## 2022-03-04 ENCOUNTER — Emergency Department (HOSPITAL_BASED_OUTPATIENT_CLINIC_OR_DEPARTMENT_OTHER): Payer: BC Managed Care – PPO

## 2022-03-04 ENCOUNTER — Emergency Department (HOSPITAL_BASED_OUTPATIENT_CLINIC_OR_DEPARTMENT_OTHER)
Admission: EM | Admit: 2022-03-04 | Discharge: 2022-03-04 | Discharge status: Against Medical Advice | Payer: BC Managed Care – PPO | Attending: Emergency Medicine | Admitting: Emergency Medicine

## 2022-03-04 ENCOUNTER — Other Ambulatory Visit: Payer: Self-pay

## 2022-03-04 ENCOUNTER — Encounter (HOSPITAL_BASED_OUTPATIENT_CLINIC_OR_DEPARTMENT_OTHER): Payer: Self-pay | Admitting: Emergency Medicine

## 2022-03-04 DIAGNOSIS — Z5329 Procedure and treatment not carried out because of patient's decision for other reasons: Secondary | ICD-10-CM | POA: Insufficient documentation

## 2022-03-04 DIAGNOSIS — U071 COVID-19: Secondary | ICD-10-CM | POA: Diagnosis not present

## 2022-03-04 DIAGNOSIS — J45909 Unspecified asthma, uncomplicated: Secondary | ICD-10-CM | POA: Diagnosis not present

## 2022-03-04 DIAGNOSIS — R059 Cough, unspecified: Secondary | ICD-10-CM | POA: Diagnosis present

## 2022-03-04 DIAGNOSIS — J101 Influenza due to other identified influenza virus with other respiratory manifestations: Secondary | ICD-10-CM | POA: Diagnosis not present

## 2022-03-04 DIAGNOSIS — Z7952 Long term (current) use of systemic steroids: Secondary | ICD-10-CM | POA: Insufficient documentation

## 2022-03-04 DIAGNOSIS — Z87891 Personal history of nicotine dependence: Secondary | ICD-10-CM | POA: Diagnosis not present

## 2022-03-04 DIAGNOSIS — Z7951 Long term (current) use of inhaled steroids: Secondary | ICD-10-CM | POA: Diagnosis not present

## 2022-03-04 DIAGNOSIS — J441 Chronic obstructive pulmonary disease with (acute) exacerbation: Secondary | ICD-10-CM | POA: Insufficient documentation

## 2022-03-04 DIAGNOSIS — Z7982 Long term (current) use of aspirin: Secondary | ICD-10-CM | POA: Diagnosis not present

## 2022-03-04 LAB — LACTIC ACID, PLASMA: Lactic Acid, Venous: 1.4 mmol/L (ref 0.5–1.9)

## 2022-03-04 LAB — CBC WITH DIFFERENTIAL/PLATELET
Abs Immature Granulocytes: 0.1 10*3/uL — ABNORMAL HIGH (ref 0.00–0.07)
Basophils Absolute: 0.1 10*3/uL (ref 0.0–0.1)
Basophils Relative: 1 %
Eosinophils Absolute: 0.1 10*3/uL (ref 0.0–0.5)
Eosinophils Relative: 1 %
HCT: 44.2 % (ref 36.0–46.0)
Hemoglobin: 14.1 g/dL (ref 12.0–15.0)
Immature Granulocytes: 1 %
Lymphocytes Relative: 16 %
Lymphs Abs: 1.6 10*3/uL (ref 0.7–4.0)
MCH: 30.5 pg (ref 26.0–34.0)
MCHC: 31.9 g/dL (ref 30.0–36.0)
MCV: 95.5 fL (ref 80.0–100.0)
Monocytes Absolute: 1.2 10*3/uL — ABNORMAL HIGH (ref 0.1–1.0)
Monocytes Relative: 12 %
Neutro Abs: 7 10*3/uL (ref 1.7–7.7)
Neutrophils Relative %: 69 %
Platelets: 307 10*3/uL (ref 150–400)
RBC: 4.63 MIL/uL (ref 3.87–5.11)
RDW: 15.2 % (ref 11.5–15.5)
WBC: 10 10*3/uL (ref 4.0–10.5)
nRBC: 0 % (ref 0.0–0.2)

## 2022-03-04 LAB — COMPREHENSIVE METABOLIC PANEL
ALT: 19 U/L (ref 0–44)
AST: 28 U/L (ref 15–41)
Albumin: 4.1 g/dL (ref 3.5–5.0)
Alkaline Phosphatase: 59 U/L (ref 38–126)
Anion gap: 12 (ref 5–15)
BUN: 20 mg/dL (ref 8–23)
CO2: 27 mmol/L (ref 22–32)
Calcium: 8.7 mg/dL — ABNORMAL LOW (ref 8.9–10.3)
Chloride: 101 mmol/L (ref 98–111)
Creatinine, Ser: 1.09 mg/dL — ABNORMAL HIGH (ref 0.44–1.00)
GFR, Estimated: 57 mL/min — ABNORMAL LOW (ref >60)
Glucose, Bld: 152 mg/dL — ABNORMAL HIGH (ref 70–99)
Potassium: 3.7 mmol/L (ref 3.5–5.1)
Sodium: 140 mmol/L (ref 135–145)
Total Bilirubin: 0.5 mg/dL (ref 0.3–1.2)
Total Protein: 7.5 g/dL (ref 6.5–8.1)

## 2022-03-04 LAB — I-STAT VENOUS BLOOD GAS, ED
Acid-Base Excess: 3 mmol/L — ABNORMAL HIGH (ref 0.0–2.0)
Bicarbonate: 30 mmol/L — ABNORMAL HIGH (ref 20.0–28.0)
Calcium, Ion: 1.15 mmol/L (ref 1.15–1.40)
HCT: 39 % (ref 36.0–46.0)
Hemoglobin: 13.3 g/dL (ref 12.0–15.0)
O2 Saturation: 68 %
Patient temperature: 98.6
Potassium: 4 mmol/L (ref 3.5–5.1)
Sodium: 138 mmol/L (ref 135–145)
TCO2: 32 mmol/L (ref 22–32)
pCO2, Ven: 54 mmHg (ref 44–60)
pH, Ven: 7.353 (ref 7.25–7.43)
pO2, Ven: 38 mmHg (ref 32–45)

## 2022-03-04 LAB — RESP PANEL BY RT-PCR (FLU A&B, COVID) ARPGX2
Influenza A by PCR: POSITIVE — AB
Influenza B by PCR: NEGATIVE
SARS Coronavirus 2 by RT PCR: POSITIVE — AB

## 2022-03-04 LAB — TROPONIN I (HIGH SENSITIVITY)
Troponin I (High Sensitivity): 4 ng/L (ref <18)
Troponin I (High Sensitivity): 3 ng/L (ref <18)

## 2022-03-04 LAB — BRAIN NATRIURETIC PEPTIDE: B Natriuretic Peptide: 23.2 pg/mL (ref 0.0–100.0)

## 2022-03-04 MED ORDER — PREDNISONE 50 MG PO TABS: 50.0000 mg | ORAL_TABLET | Freq: Every day | ORAL | 0 refills | Status: DC

## 2022-03-04 MED ORDER — PREDNISONE 50 MG PO TABS
50.0000 mg | ORAL_TABLET | Freq: Once | ORAL | Status: AC
  Administered 2022-03-04: 50 mg via ORAL
  Filled 2022-03-04: qty 1

## 2022-03-04 MED ORDER — DOXYCYCLINE HYCLATE 100 MG PO CAPS: 100.0000 mg | ORAL_CAPSULE | Freq: Two times a day (BID) | ORAL | 0 refills | Status: DC

## 2022-03-04 MED ORDER — IPRATROPIUM-ALBUTEROL 0.5-2.5 (3) MG/3ML IN SOLN
3.0000 mL | Freq: Once | RESPIRATORY_TRACT | Status: AC
  Administered 2022-03-04: 3 mL via RESPIRATORY_TRACT
  Filled 2022-03-04: qty 3

## 2022-03-04 MED ORDER — PREDNISONE 50 MG PO TABS: 50.0000 mg | ORAL_TABLET | Freq: Every day | ORAL | 0 refills | Status: AC

## 2022-03-04 NOTE — ED Triage Notes (Signed)
Pt arrives to ED with c/o SOB, cough, dizziness x1 week.

## 2022-03-04 NOTE — ED Notes (Signed)
RT note: Pt. was given cup of water after this RT was  unsuccessful obtaining Venous blood off IV on Left hand for ordered VBG, RN to be made aware, pt. remains on 1 lpm n/c.

## 2022-03-04 NOTE — ED Provider Notes (Signed)
[facility] EMERGENCY DEPT Provider Note   CSN: [id number] Arrival date & time: 03/04/22  3295     History {Add pertinent medical, surgical, social history, OB history to HPI:1} Chief Complaint  Patient presents with   Shortness of Breath    [name] is a 66 y.o. female.  Patient as above with significant medical history as below, including COPD (stopped smoking, no home o2 use), migraine, CVA, depression, ASD, adhd, asthma who presents to the ED with complaint of URI s/s. Pt with onset of symptoms x1 week. Was seen by pcp started on z-pack and steroids, symptoms mild improvement. Cough with sputum initially yellow/green, now more of a [name] / brown color. Chest tightness, chest pain, dib. Low grade fever at home, no chills, no n/v, no change to bowel/ bladder  fxn. Has been using home rescue inhaler more frequently with improvement to her symptoms. Unsure sick contacts, tolerating PO w/o difficulty but limited PO yesterday 2/2 feeling unwell.      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   PFT 11/02/13- mild obstructive airways disease with insignificant response to bronchodilator    COPD with acute bronchitis (HCC) 03/27/2009   Qualifier: Diagnosis of  By: Maple Hudson MD, Clinton D    CVA (cerebral infarction) 07/14/2014   Depression with anxiety 01/16/2015   Fatigue    of uncertain etiology   Former smoker 03/20/2017   Gait disturbance, 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   History of frequent urinary  tract infections    Migraine headache    since childhood   Numbness 07/14/2014   Other headache syndrome 09/12/2014   Overweight    Patent foramen ovale 07/12/2014   PONV (postoperative nausea and vomiting)    Pt states Zofran does not relieve sx; prefers Phenergan   Post-menopause on HRT (hormone replacement therapy)    Restless leg    Seasonal and perennial allergic rhinitis 02/27/2009   Qualifier: Diagnosis of  By: Roxan Hockey CMA, Jessica     Stroke [facility])    Stroke occurring within last month 07/12/2014   Upper airway cough syndrome 04/29/2015   Trial of max gerd rx 04/28/2015 >>>       URI 03/13/2009   Qualifier: Diagnosis of  By: Maple Hudson MD, Clinton D     Past Surgical History:  Procedure Laterality Date   ABDOMINAL HYSTERECTOMY     amplatzer septal occluder  07/2014   ANTERIOR CERVICAL DECOMP/DISCECTOMY FUSION  05/06/2012   Procedure: ANTERIOR CERVICAL DECOMPRESSION/DISCECTOMY FUSION 2 LEVELS;  Surgeon: Emilee Hero, MD;  Location: [facility] OR;  Service: Orthopedics;  Laterality: Bilateral;  Anterior cervical decompression fusion, cervical 5-6, cervical 6-7 with instrumentation and allograft.   ASD REPAIR     BLADDER SUSPENSION  2001   cardiac and vascular implants and grafts     CARDIAC CATHETERIZATION N/A 08/08/2014   Procedure: ASD Closure;  Surgeon: Tonny Bollman, MD;  Location: [facility] INVASIVE CV LAB;  Service: Cardiovascular;  Laterality: N/A;   PARTIAL  HYSTERECTOMY     sinus surgery repair     TEE WITHOUT CARDIOVERSION N/A 07/15/2014   Procedure: TRANSESOPHAGEAL ECHOCARDIOGRAM (TEE);  Surgeon: Thurmon Fair, MD;  Location: [facility] ENDOSCOPY;  Service: Cardiovascular;  Laterality: N/A;   TUBAL LIGATION     tubel ligation       The history is provided by the patient and the spouse. No language interpreter was used.  Shortness of Breath Associated symptoms: cough   Associated symptoms: no abdominal pain, no chest pain, no fever, no headaches, no rash and no vomiting        Home  Medications Prior to Admission medications   Medication Sig Start Date End Date Taking? Authorizing Provider  acetaminophen (TYLENOL) 325 MG tablet Take 650 mg by mouth every 6 (six) hours as needed for headache.    [provider]  albuterol (PROVENTIL HFA) 108 (90 Base) MCG/ACT inhaler Inhale 2 puffs into the lungs every 4 (four) hours as needed for wheezing or shortness of breath. ProAir 11/18/16   Jetty Duhamel D, MD  aspirin EC 81 MG tablet Take 81 mg by mouth daily.    [provider]  azithromycin (ZITHROMAX) 250 MG tablet Take two today and then one daily until finished. 01/28/22   Waymon Budge, MD  benzonatate (TESSALON) 200 MG capsule Take 1 capsule (200 mg total) by mouth 3 (three) times daily as needed for cough. 12/08/17   Jetty Duhamel D, MD  Budeson-Glycopyrrol-Formoterol (BREZTRI AEROSPHERE) 160-9-4.8 MCG/ACT AERO Inhale 2 puffs then rinse mouth, twice daily 08/09/20   Jetty Duhamel D, MD  clonazePAM (KLONOPIN) 2 MG tablet Take 2 mg by mouth at bedtime. For restless legs 07/07/14   [provider]  fluticasone (FLONASE) 50 MCG/ACT nasal spray Place 2 sprays into both nostrils daily. 08/13/21   Jetty Duhamel D, MD  HYDROcodone bit-homatropine (HYCODAN) 5-1.5 MG/5ML syrup Take 5 mLs by mouth every 6 (six) hours as needed for cough. 08/13/21   Jetty Duhamel D, MD  ipratropium-albuterol (DUONEB) 0.5-2.5 (3) MG/3ML SOLN Take 3 mLs by nebulization every 6 (six) hours as needed. 08/13/21   Jetty Duhamel D, MD  montelukast (SINGULAIR) 10 MG tablet TAKE 1 TABLET BY MOUTH EVERY DAY 11/26/17   Jetty Duhamel D, MD  Multiple Vitamin (MULTIVITAMIN WITH MINERALS) TABS tablet Take 1 tablet by mouth daily.    [provider]  Nebulizers (COMPRESSOR/NEBULIZER) MISC Use as directed 06/08/18   Jetty Duhamel D, MD  OZEMPIC, 0.25 OR 0.5 MG/DOSE, 2 MG/3ML SOPN Inject 0.5 mg into the skin once a week. 10/30/21   [provider]  venlafaxine (EFFEXOR) 75 MG tablet  Take 75 mg by mouth 3 (three) times daily with meals. Patient takes once a day    [provider]      Allergies    Penicillins, Cefuroxime, Excedrin tension headache [acetaminophen-caffeine], Hydrocodone, Codeine, and Vicodin [hydrocodone-acetaminophen]    Review of Systems   Review of Systems  Constitutional:  Positive for appetite change and fatigue. Negative for chills and fever.  HENT:  Positive for congestion, postnasal drip and rhinorrhea. Negative for facial swelling and trouble swallowing.   Eyes:  Negative for photophobia and visual disturbance.  Respiratory:  Positive for cough, chest tightness and shortness of breath.   Cardiovascular:  Negative for chest pain and palpitations.  Gastrointestinal:  Negative for abdominal pain, nausea and vomiting.  Endocrine: Negative for polydipsia and polyuria.  Genitourinary:  Negative for difficulty urinating and hematuria.  Musculoskeletal:  Negative for gait problem  and joint swelling.  Skin:  Negative for pallor and rash.  Neurological:  Negative for syncope and headaches.  Psychiatric/Behavioral:  Negative for agitation and confusion.     Physical Exam Updated Vital Signs BP 126/73   Pulse 91   Temp 98.6 F (37 C) (Oral)   Resp 19   Ht  (1.676 m)   Wt 86.2 kg   SpO2 92%   BMI 30.67 kg/m  Physical Exam Vitals and nursing note reviewed.  Constitutional:      General: She is not in acute distress.    Appearance: Normal appearance. She is well-developed. She is obese. She is not ill-appearing, toxic-appearing or diaphoretic.  HENT:     Head: Normocephalic and atraumatic. No right periorbital erythema or left periorbital erythema.     Jaw: There is normal jaw occlusion.     Right Ear: External ear normal.     Left Ear: External ear normal.     Nose: Nose normal.     Mouth/Throat:     Mouth: Mucous membranes are moist.  Eyes:     General: No scleral icterus.       Right eye: No discharge.        Left eye:  No discharge.  Cardiovascular:     Rate and Rhythm: Normal rate and regular rhythm.     Pulses: Normal pulses.     Heart sounds: Normal heart sounds.  Pulmonary:     Effort: Pulmonary effort is normal. No tachypnea, accessory muscle usage or respiratory distress.     Breath sounds: No stridor. Wheezing present.  Abdominal:     General: Abdomen is flat.     Tenderness: There is no abdominal tenderness. There is no guarding or rebound.  Musculoskeletal:        General: Normal range of motion.     Cervical back: Normal range of motion.     Right lower leg: No edema.     Left lower leg: No edema.  Skin:    General: Skin is warm and dry.     Capillary Refill: Capillary refill takes less than 2 seconds.  Neurological:     Mental Status: She is alert and oriented to person, place, and time. Mental status is at baseline.     GCS: GCS eye subscore is 4. GCS verbal subscore is 5. GCS motor subscore is 6.  Psychiatric:        Mood and Affect: Mood normal.        Behavior: Behavior normal.     ED Results / Procedures / Treatments   Labs (all labs ordered are listed, but only abnormal results are displayed) Labs Reviewed  CBC WITH DIFFERENTIAL/PLATELET - Abnormal; Notable for the following components:      Result Value   Monocytes Absolute 1.2 (*)    Abs Immature Granulocytes 0.10 (*)    All other components within normal limits  CULTURE, BLOOD (ROUTINE X 2)  CULTURE, BLOOD (ROUTINE X 2)  RESP PANEL BY RT-PCR (FLU A&B, COVID) ARPGX2  COMPREHENSIVE METABOLIC PANEL  LACTIC ACID, PLASMA  LACTIC ACID, PLASMA  BRAIN NATRIURETIC PEPTIDE  BLOOD GAS, VENOUS  TROPONIN I (HIGH SENSITIVITY)    EKG None  Radiology DG Chest Port 1 View  Result Date: 03/04/2022 CLINICAL DATA:  Cough and shortness of breath, dizziness for 1 week. EXAM: PORTABLE CHEST 1 VIEW COMPARISON:  None Available. FINDINGS: The heart size and mediastinal contours are within normal limits. Both lungs are clear. Thoracic  spondylosis. Partially imaged hardware for anterior cervical discectomy and fusion. IMPRESSION: No active disease. Electronically Signed   By: Larose HiresImran  Ahmed D.O.   On: 03/04/2022 09:22    Procedures Procedures  {Document cardiac monitor, telemetry assessment procedure when appropriate:1}  Medications Ordered in ED Medications  ipratropium-albuterol (DUONEB) 0.5-2.5 (3) MG/3ML nebulizer solution 3 mL (has no administration in time range)    ED Course/ Medical Decision Making/ A&P                           Medical Decision Making Amount and/or Complexity of Data Reviewed Labs: ordered. Radiology: ordered.  Risk Prescription drug management.   This patient presents to the ED with chief complaint(s) of dib, uri s/s with pertinent past medical history of copd, asthma, as above which further complicates the presenting complaint. The complaint involves an extensive differential diagnosis and also carries with it a high risk of complications and morbidity.     In my evaluation of this patient's dyspnea my DDx includes, but is not limited to, pneumonia, pulmonary embolism, pneumothorax, pulmonary edema, metabolic acidosis, asthma, COPD, cardiac cause, anemia, anxiety, etc.  . Serious etiologies were considered.   The initial plan is to screening labs/ xr/ duoneb   Additional history obtained: Additional history obtained from spouse Records reviewed Primary Care Documents and prior ED visits, prior labs/imaging   Independent labs interpretation:  The following labs were independently interpreted:  FluA postive, Covid also positive; she was dx Covid 30-45 days ago, likely positive from prior infection Vbg stable CBC stable CMP stable La not elev  Independent visualization of imaging: - I independently visualized the following imaging with scope of interpretation limited to determining acute life threatening conditions related to emergency care: CXR, which revealed no acute  process  Cardiac monitoring was reviewed and interpreted by myself which shows NSR  Treatment and Reassessment: Duoneb Prednisone >> improved  Consultation: - Consulted or discussed management/test interpretation w/ external professional: na na Consideration for admission or further workup: Admission was considered    The patient has requested to leave the ED against medical advice. I believe this patient is of sound mind and medical decision making capacity to refuse medical care. The patient is responding and asking questions appropriately. The patient is oriented to person, place and time. The patient is not psychotic, delusional, suicidal, homicidal or hallucinating. The patient demonstrates a normal mental capacity to make decisions regarding their healthcare. The patient is clinically sober and does not appear to be under the influence of any illicit drugs at this time. The patient has been advised of the risks, in layman terms, of leaving AMA which include, but are not limited to death, coma, permanent disability, loss of current lifestyle, delay in diagnosis. Alternatives have been offered - the patient remains steadfast in their wish to leave. The patient has been advised that should they change their mind they are welcome to return to this hospital, or any other, at any time. The patient understands that in no way does an AMA discharge mean that I do not want them to have the best medical care available. To this end, I have offered appropriate prescriptions, referrals, and discharge instructions. The patient did sign AMA paperwork. The above discussion was witnessed by another member of staff.   Social Determinants of health: Social History   Tobacco Use   Smoking status: Former    Packs/day: 2.00    Years: 25.00  Total pack years: 50.00    Types: Cigarettes    Quit date: 04/02/2003    Years since quitting: 18.9   Smokeless tobacco: Never  Vaping Use   Vaping Use: Never used   Substance Use Topics   Alcohol use: No   Drug use: No      {Document critical care time when appropriate:1} {Document review of labs and clinical decision tools ie heart score, Chads2Vasc2 etc:1}  {Document your independent review of radiology images, and any outside records:1} {Document your discussion with family members, caretakers, and with consultants:1} {Document social determinants of health affecting pt's care:1} {Document your decision making why or why not admission, treatments were needed:1} Final Clinical Impression(s) / ED Diagnoses Final diagnoses:  None    Rx / DC Orders ED Discharge Orders     None

## 2022-03-04 NOTE — Discharge Instructions (Signed)
It was a pleasure caring for you today in the emergency department.  Please return to the emergency department for any worsening or worrisome symptoms.  Please return to ED if you change your mind and would like to be admitted  Please return if your symptoms worsen  Please follow up with PCP for re-check

## 2022-03-09 LAB — CULTURE, BLOOD (ROUTINE X 2)
Culture: NO GROWTH
Special Requests: ADEQUATE

## 2022-04-09 ENCOUNTER — Other Ambulatory Visit: Payer: Self-pay | Admitting: Family Medicine

## 2022-04-09 DIAGNOSIS — Z1231 Encounter for screening mammogram for malignant neoplasm of breast: Secondary | ICD-10-CM

## 2022-04-17 NOTE — Progress Notes (Deleted)
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  FVC 1.62/45%, FEV1 1.12/40%, ratio 0.69, FEF 25-75% 0.69/27% Lab 08/13/21- IgE 44 , Eosinophils 300    -------------------------------------------------------------------------   01/15/22-   67 yo female former smoker(50 pk yrs) followed for allergic Rhinitis, COPD mixed type, complicated by embolic stroke RMCA/ ASD closed 2016, Covid infection Jan 2022. Was enrolled in [place] program by Eric Form- no longer eligible. -Breztri, Singulair, albuterol HFA, Tessalon, neb Duoneb Covid vax-2 Phizer   Flu vax-standard today Sleep study pending 10/24 ACT score- 20 We reviewed plans for upcoming sleep study.  She has not wanted CPAP.  We agreed to see what the test results look like and discuss options then. She is comfortable with her inhalers.  Sometimes feels a little "stuffy" in her chest without cough or wheeze.  Mild seasonal nasal drainage.  No significant exacerbation.  04/18/22-  67 yo female former smoker(50 pk yrs) followed for allergic Rhinitis, COPD mixed type, complicated by embolic stroke RMCA/ ASD closed 2016, Covid infection Jan 2022. Was enrolled in [place] program by Eric Form- no longer eligible. -Breztri, Singulair, albuterol HFA, Tessalon, neb Duoneb Covid vax-2 Phizer   Flu vax-standard today Sleep study pending 10/24- not done ED 03/04/22- COPD exacerbation> + Flu A, +recent prior Covid inf, >>Doxycycline, prednisone  CXR 03/04/22 1V- IMPRESSION: No active disease.         ROS-see HPI   + = positive Constitutional:   No-   weight loss, night sweats, fevers, chills, fatigue, lassitude. HEENT:   + headaches,  difficulty swallowing, tooth/dental problems, sore throat,       No-  sneezing, itching, ear ache,  +nasal congestion, post nasal drip,  CV:  No-   chest pain, orthopnea, PND, swelling in lower extremities, anasarca, dizziness, palpitations Resp:   shortness of breath with exertion or at rest.                 productive cough,  No non-productive cough,  No- coughing up of blood.               change in color of mucus.   +wheezing.   Skin: No-   rash or lesions. GI:  No-   heartburn, indigestion, abdominal pain, nausea, vomiting, GU:  MS:  No-   joint pain or swelling.   Neuro-     nothing unusual Psych:  No- change in mood or affect. No depression or anxiety.  No memory loss.  OBJ- Physical Exam   General- Alert, Oriented, Affect-appropriate, Distress- none acute, +obese Skin- rash-none, lesions- none, excoriation- none Lymphadenopathy- none Head- atraumatic            Eyes- Gross vision intact, PERRLA, conjunctivae and secretions clear            Ears- Hearing, canals-normal            Nose- + turbinate edema, no-Septal dev, mucus, polyps, erosion, perforation             Throat- Mallampati II , mucosa -, drainage- none, tonsils- atrophic,            Neck- flexible , trachea midline, no stridor , thyroid nl, carotid no bruit Chest - symmetrical excursion , unlabored  Heart/CV- RRR , no murmur , no gallop  , no rub, nl s1 s2                           - JVD- none , edema- none, stasis changes- none, varices- none           Lung- + diminished / clear, wheeze- none, cough- none , dullness-none, rub- none           Chest wall-  Abd-  Br/ Gen/ Rectal- Not done, not indicated Extrem- cyanosis- none, clubbing, none, atrophy- none, strength- nl Neuro- grossly intact to observation

## 2022-04-18 ENCOUNTER — Other Ambulatory Visit: Payer: Self-pay

## 2022-04-18 ENCOUNTER — Ambulatory Visit: Payer: BC Managed Care – PPO | Admitting: Internal Medicine

## 2022-04-18 DIAGNOSIS — R29818 Other symptoms and signs involving the nervous system: Secondary | ICD-10-CM

## 2022-04-18 DIAGNOSIS — R0683 Snoring: Secondary | ICD-10-CM

## 2022-04-26 ENCOUNTER — Telehealth: Payer: Self-pay | Admitting: Internal Medicine

## 2022-04-26 NOTE — Telephone Encounter (Signed)
HST denied by [facility] not meeting medical necessity  We can change the request to an in lab and have it approved automatically or the provider can have a P2P scheduled for the case for the HST to be approved    CASE ID: [id number] Approved for an In lab sleep study   Please let me know how youd like to proceed

## 2022-04-26 NOTE — Telephone Encounter (Signed)
ATC X1 LVM for patient to call the office back 

## 2022-04-26 NOTE — Telephone Encounter (Signed)
[name]- [name] insurance will not approve a home sleep tet, but will approve an in-lab overnight sleep study at sleep center. If she agrees, please change HST order to NPSG for dx OSA

## 2022-05-03 ENCOUNTER — Other Ambulatory Visit: Payer: Self-pay

## 2022-05-03 DIAGNOSIS — G4733 Obstructive sleep apnea (adult) (pediatric): Secondary | ICD-10-CM

## 2022-05-03 NOTE — Telephone Encounter (Signed)
Spoke to patient in regards to HST. I have placed the order for an in lab sleep study. Nothing further needed.

## 2022-05-30 ENCOUNTER — Ambulatory Visit
Admission: RE | Admit: 2022-05-30 | Discharge: 2022-05-30 | Disposition: A | Discharge status: Home / Self Care | Payer: BC Managed Care – PPO | Source: Ambulatory Visit | Attending: Family Medicine | Admitting: Family Medicine

## 2022-05-30 DIAGNOSIS — Z1231 Encounter for screening mammogram for malignant neoplasm of breast: Secondary | ICD-10-CM

## 2022-06-03 ENCOUNTER — Other Ambulatory Visit: Payer: Self-pay | Admitting: Family Medicine

## 2022-06-03 DIAGNOSIS — R928 Other abnormal and inconclusive findings on diagnostic imaging of breast: Secondary | ICD-10-CM

## 2022-06-08 ENCOUNTER — Ambulatory Visit
Admission: RE | Admit: 2022-06-08 | Discharge: 2022-06-08 | Disposition: A | Discharge status: Home / Self Care | Payer: BC Managed Care – PPO | Source: Ambulatory Visit | Attending: Family Medicine | Admitting: Family Medicine

## 2022-06-08 ENCOUNTER — Ambulatory Visit: Payer: BC Managed Care – PPO

## 2022-06-08 DIAGNOSIS — R928 Other abnormal and inconclusive findings on diagnostic imaging of breast: Secondary | ICD-10-CM

## 2022-06-17 ENCOUNTER — Ambulatory Visit (HOSPITAL_BASED_OUTPATIENT_CLINIC_OR_DEPARTMENT_OTHER): Payer: BC Managed Care – PPO | Attending: Internal Medicine | Admitting: Internal Medicine

## 2022-06-17 VITALS — Ht 66.0 in | Wt 174.0 lb

## 2022-06-17 DIAGNOSIS — R0902 Hypoxemia: Secondary | ICD-10-CM | POA: Insufficient documentation

## 2022-06-17 DIAGNOSIS — G4733 Obstructive sleep apnea (adult) (pediatric): Secondary | ICD-10-CM | POA: Insufficient documentation

## 2022-06-17 DIAGNOSIS — R0683 Snoring: Secondary | ICD-10-CM | POA: Diagnosis not present

## 2022-06-17 DIAGNOSIS — G4734 Idiopathic sleep related nonobstructive alveolar hypoventilation: Secondary | ICD-10-CM

## 2022-06-22 DIAGNOSIS — G4733 Obstructive sleep apnea (adult) (pediatric): Secondary | ICD-10-CM | POA: Diagnosis not present

## 2022-06-22 NOTE — Procedures (Signed)
     Patient Name: [name], Wilkerson Date: 06/17/2022 Gender: Female D.O.B: May 24, 1955 Age (years): 79 Referring Provider: Baird Lyons MD, ABSM Height (inches): 66 Interpreting Physician: Baird Lyons MD, ABSM Weight (lbs): 174 RPSGT: Laren Everts BMI: 28 MRN: [id number] Neck Size: 16.00  CLINICAL INFORMATION Sleep Study Type: NPSG Indication for sleep study: COPD, Depression, Fatigue, Morning Headaches, Restless Sleep with Limb Movments Epworth Sleepiness Score: 10  SLEEP STUDY TECHNIQUE As per the AASM Manual for the Scoring of Sleep and Associated Events v2.3 (April 2016) with a hypopnea requiring 4% desaturations.  The channels recorded and monitored were frontal, central and occipital EEG, electrooculogram (EOG), submentalis EMG (chin), nasal and oral airflow, thoracic and abdominal wall motion, anterior tibialis EMG, snore microphone, electrocardiogram, and pulse oximetry.  MEDICATIONS Medications self-administered by patient taken the night of the study : CLONAZEPAM, MONTELUKAST  SLEEP ARCHITECTURE The study was initiated at 10:15:05 PM and ended at 5:03:29 AM.  Sleep onset time was 82.6 minutes and the sleep efficiency was 67.7%. The total sleep time was 276.5 minutes.  Stage REM latency was 265.0 minutes.  The patient spent 4.5% of the night in stage N1 sleep, 89.5% in stage N2 sleep, 0.0% in stage N3 and 6% in REM.  Alpha intrusion was absent.  Supine sleep was 0.00%.  RESPIRATORY PARAMETERS The overall apnea/hypopnea index (AHI) was 2.0 per hour. There were 0 total apneas, including 0 obstructive, 0 central and 0 mixed apneas. There were 9 hypopneas and 11 RERAs.  The AHI during Stage REM sleep was 18.2 per hour.  AHI while supine was N/A per hour.  The mean oxygen saturation was 91.0%. The minimum SpO2 during sleep was 84.0%.  soft snoring was noted during this study.  CARDIAC DATA The 2 lead EKG demonstrated sinus rhythm. The mean heart rate  was 67.5 beats per minute. Other EKG findings include: PVCs.  LEG MOVEMENT DATA The total PLMS were 0 with a resulting PLMS index of 0.0. Associated arousal with leg movement index was 0.0 .  IMPRESSIONS - No significant obstructive sleep apnea occurred during this study (AHI = 2.0/h). - oxygen desaturation was noted during this study (Min O2 = 84.0%). Supplemental O2 1 L was added per protocol at 12:14 AM for persistent O2 saturation below 88%. Subsequent O2 saturation was 90-93%. - The patient snored with soft snoring volume. - EKG findings include PVCs. - Clinically significant periodic limb movements did not occur during sleep. No significant associated arousals. - Bathroom x 3.  DIAGNOSIS - Nocturnal Hypoxemia (G47.36) - Primary Snoring  RECOMMENDATIONS - Manage for snoring and nocturnal hypoxemia based on clinical judgment. - Sleep hygiene should be reviewed to assess factors that may improve sleep quality. - Weight management and regular exercise should be initiated or continued if appropriate.  [Electronically signed] 06/22/2022 01:08 PM  Baird Lyons MD, [place], American Board of Sleep Medicine NPI: [id number]                        [place], [place] [facility]  ELECTRONICALLY SIGNED ON:  06/22/2022, 1:04 PM [place] PH: [phone]   FX: [phone] [place]

## 2022-07-20 NOTE — Progress Notes (Signed)
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  FVC 1.62/45%, FEV1 1.12/40%, ratio 0.69, FEF 25-75% 0.69/27% Eos 08/13/21- IgE 44 , Eosinophils 300   NPSG 06/17/22- AHI 2/ hr, desaturation to 84%> O2 1L/ hr for O2 90-93%, body weight 174 lbs -------------------------------------------------------------------------   11/13/21- 67 yo female former smoker(50 pk yrs) followed for allergic rhinitis, COPD mixed type, complicated by embolic stroke RMCA/ ASD closed 2016, Covid infection Jan 2022. Was enrolled in Lung Cancer Screening CT program by Kandice Robinsons- no longer eligible. -Breztri, Singulair, albuterol HFA, Tessalon, neb Duoneb Covid vax-2 Phizer Eos 08/13/21- IgE 44 , Eosinophils 300  Acute OV 5/25- TP, NP-persistent cough after choking on cake.> depo 80, Neb Duoneb Misunderstanding- didn't get sleep study- being rescheduled. Does not want CPAP- claustrophobic. Husband has Inspire. Breathing ok right now, but DOE which she mows yard on weekends triggers cough.  Then she needs to use her inhaler for a few days. CXR 08/23/21- IMPRESSION: No active cardiopulmonary disease.  07/22/22- 67 yo female former smoker(50 pk yrs) followed for allergic rhinitis, COPD mixed type, complicated by embolic stroke RMCA/ ASD closed 2016, Covid infection Jan 2022. Was enrolled in Lung Cancer Screening CT program by Kandice Robinsons- no longer eligible. -Breztri, Singulair, albuterol HFA, Tessalon, neb Duoneb NPSG 06/17/22- AHI 2/ hr, desaturation to 84%> O2 1L/ hr for O2 90-93%, body weight 174 lbs Body weight today-175 lbs We reviewed and discussed her sleep study showing normal levels of apnea.  We want to recheck oxygen desaturation during  sleep.  She still has oxygen at home but rarely uses it.               ROS-see HPI   + = positive Constitutional:   No-   weight loss, night sweats, fevers, chills, fatigue, lassitude. HEENT:   + headaches, difficulty swallowing, tooth/dental problems, sore throat,       No-  sneezing, itching, ear ache,  +nasal congestion, post nasal drip,  CV:  No-   chest pain, orthopnea, PND, swelling in lower extremities, anasarca, dizziness, palpitations Resp:   shortness of breath with exertion or at rest.                 productive cough,  No non-productive cough,  No- coughing up of blood.               change in color of mucus.   +wheezing.   Skin: No-   rash or lesions. GI:  No-   heartburn, indigestion, abdominal pain, nausea, vomiting, GU:  MS:  No-   joint pain or swelling.   Neuro-     nothing unusual Psych:  No- change in mood or affect. No depression or anxiety.  No memory loss.  OBJ- Physical Exam   General- Alert, Oriented, Affect-appropriate, Distress- none acute,  Skin- rash-none, lesions- none, excoriation- none Lymphadenopathy- none Head- atraumatic            Eyes- Gross vision intact, PERRLA, conjunctivae and secretions clear            Ears- Hearing, canals-normal            Nose- + turbinate edema, no-Septal dev, mucus, polyps, erosion, perforation             Throat- Mallampati II ,  mucosa cobblestoned , drainage- none, tonsils- atrophic,            Neck- flexible , trachea midline, no stridor , thyroid nl, carotid no bruit Chest - symmetrical excursion , unlabored           Heart/CV- RRR , no murmur , no gallop  , no rub, nl s1 s2                           - JVD- none , edema- none, stasis changes- none, varices- none           Lung- + diminished / clear, wheeze- none, cough- none , dullness-none, rub- none           Chest wall-  Abd-  Br/ Gen/ Rectal- Not done, not indicated Extrem- cyanosis- none, clubbing, none, atrophy- none, strength- nl Neuro- grossly intact to  observation

## 2022-07-22 ENCOUNTER — Encounter: Payer: Self-pay | Admitting: Internal Medicine

## 2022-07-22 ENCOUNTER — Ambulatory Visit (INDEPENDENT_AMBULATORY_CARE_PROVIDER_SITE_OTHER): Payer: BC Managed Care – PPO | Admitting: Internal Medicine

## 2022-07-22 VITALS — BP 126/68 | HR 76 | Temp 98.0°F | Ht 66.0 in | Wt 175.8 lb

## 2022-07-22 DIAGNOSIS — G4733 Obstructive sleep apnea (adult) (pediatric): Secondary | ICD-10-CM

## 2022-07-22 DIAGNOSIS — G4734 Idiopathic sleep related nonobstructive alveolar hypoventilation: Secondary | ICD-10-CM | POA: Diagnosis not present

## 2022-07-22 NOTE — Assessment & Plan Note (Signed)
She had qualified for sleep oxygen in the past but has not been using it.  Since sleep study showed desaturation and technician added 1 L O2. Plan-overnight oximetry on room air

## 2022-07-22 NOTE — Patient Instructions (Signed)
Order- DME [facility]- overnight oximetry on room air Dx Nocturnal hypoxemia

## 2022-07-22 NOTE — Assessment & Plan Note (Signed)
Have sleep apnea and I will resolve this problem.  She understands to keep her weight down and try to sleep off the flat of her back as much as she can.

## 2022-09-24 ENCOUNTER — Encounter: Payer: Self-pay | Admitting: Internal Medicine

## 2022-11-07 IMAGING — CT CT RENAL STONE PROTOCOL
2 of 4 series · 16 of 46 positions shown, 18 images · non-contrast
Comparison: 07/26/2020

CLINICAL DATA: Right flank pain.  Kidney stone suspected.



[Series 2: stone full · axial · 0.77mm/px · z∈[-550,-150]mm · 13 of 88 slices shown, 15 images]
[im 4/88  soft-tissue]
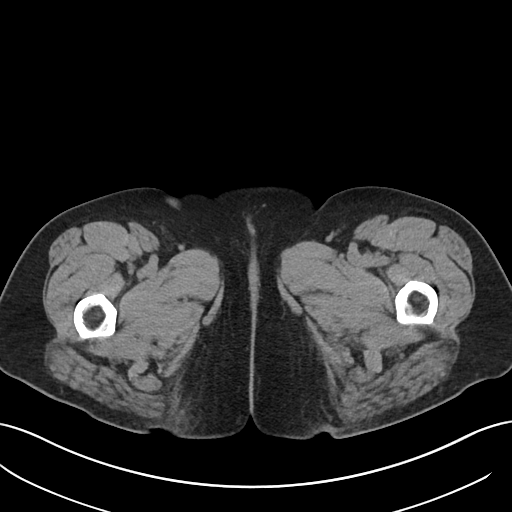
[im 4/88  bone]
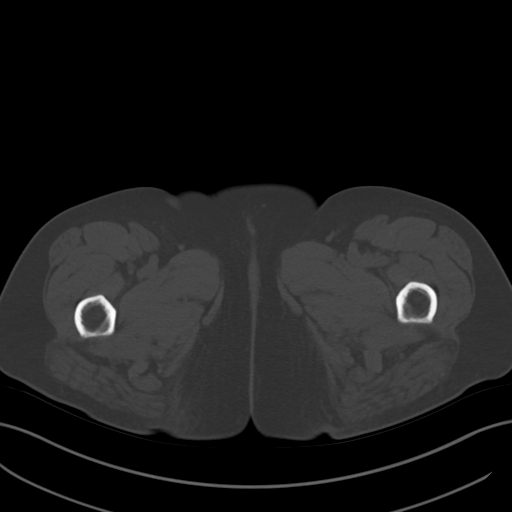
[im 11/88  soft-tissue]
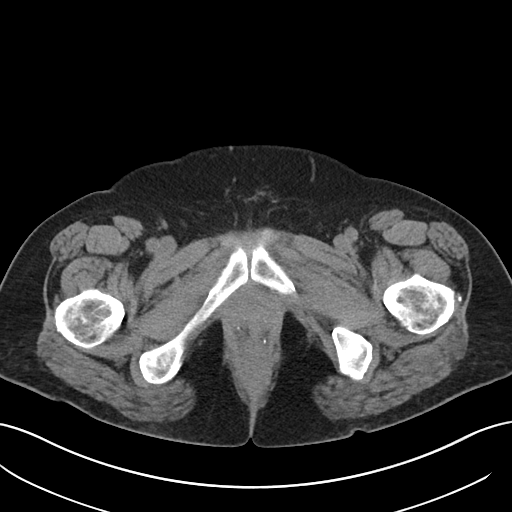
[im 17/88  soft-tissue]
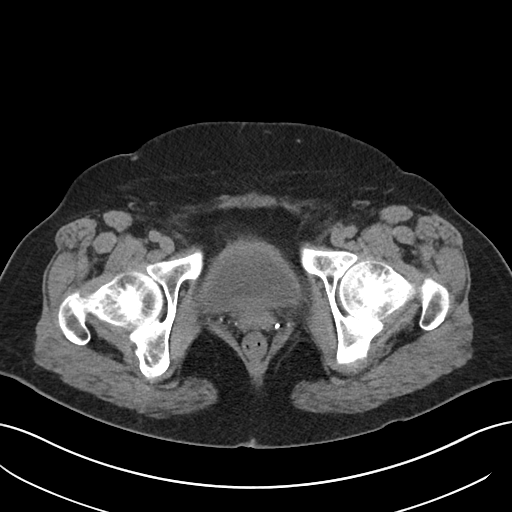
[im 24/88  soft-tissue]
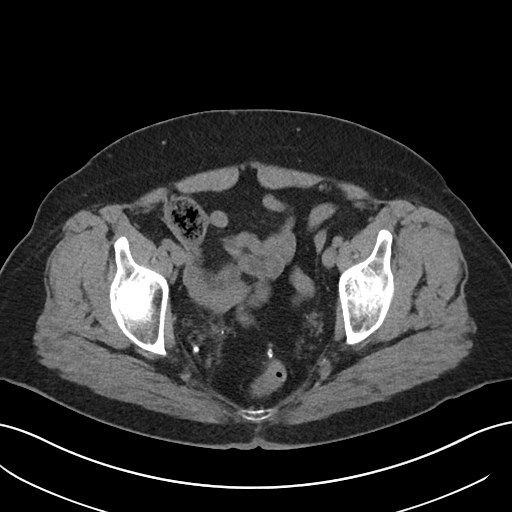
[im 31/88  soft-tissue]
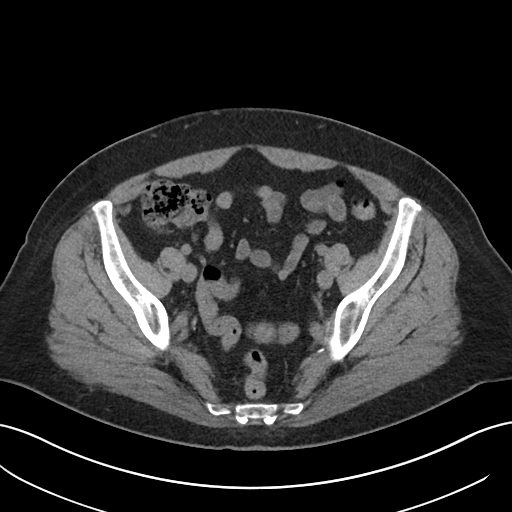
[im 37/88  soft-tissue]
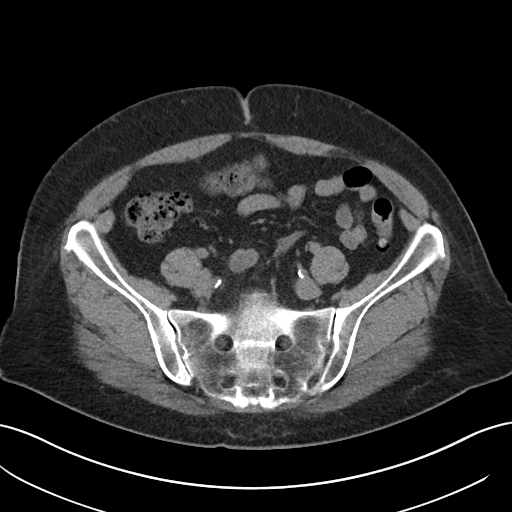
[im 44/88  soft-tissue]
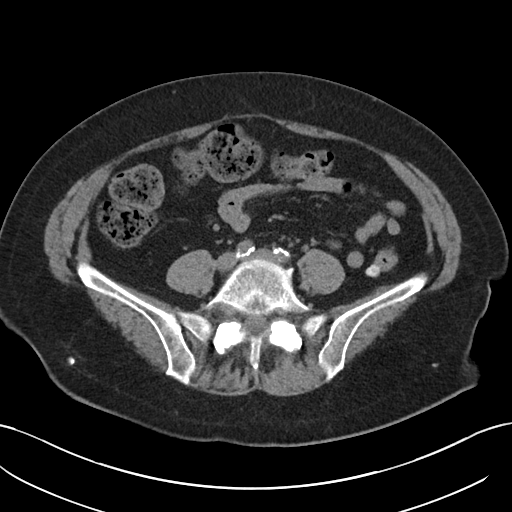
[im 51/88  soft-tissue]
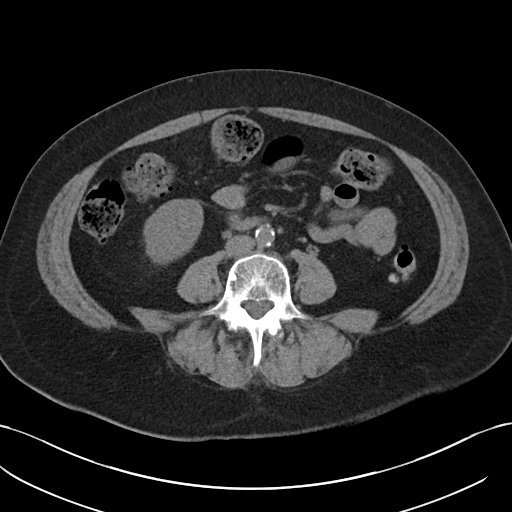
[im 57/88  soft-tissue]
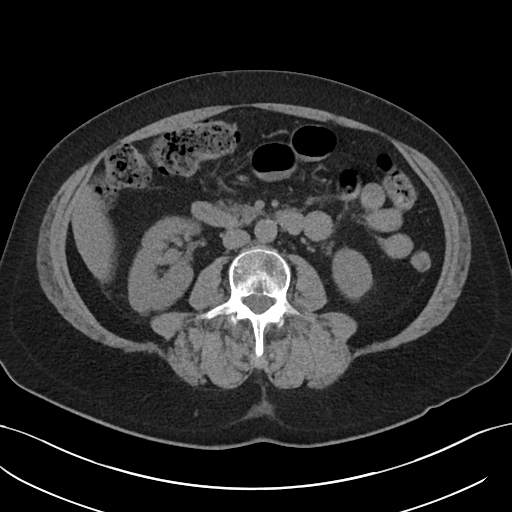
[im 57/88  bone]
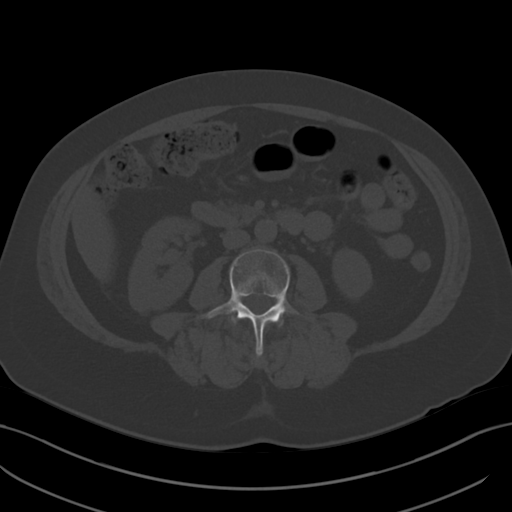
[im 64/88  soft-tissue]
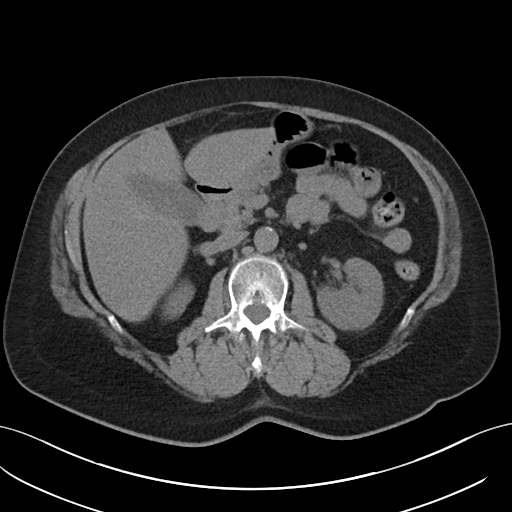
[im 71/88  soft-tissue]
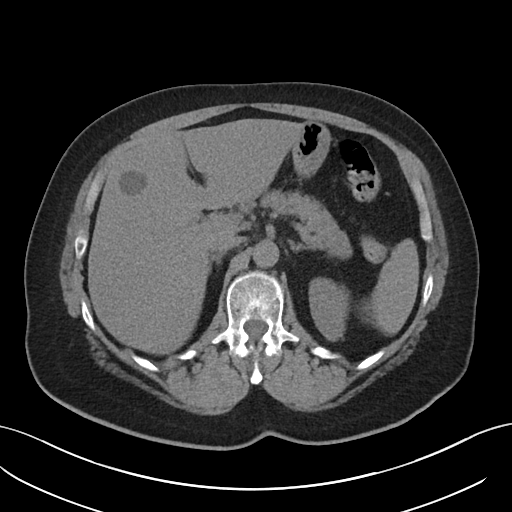
[im 77/88  soft-tissue]
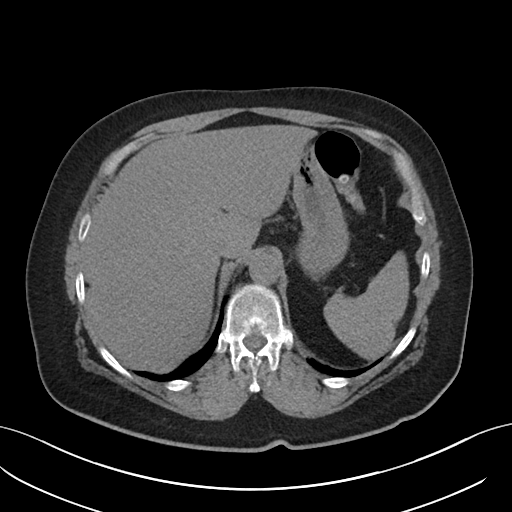
[im 84/88  soft-tissue]
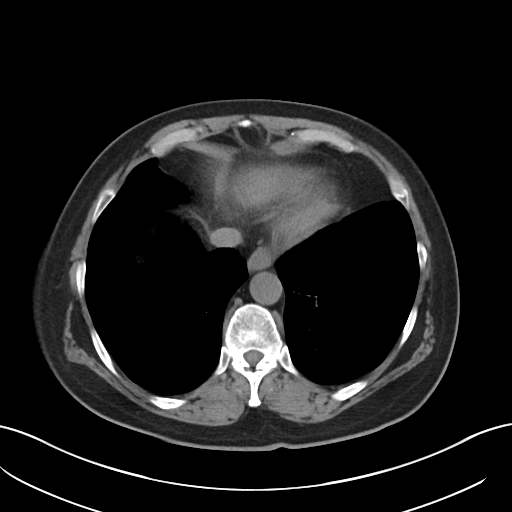

[Series 5: coronal · coronal · 0.75mm/px · 3 of 99 slices shown]
[im 33/99  soft-tissue]
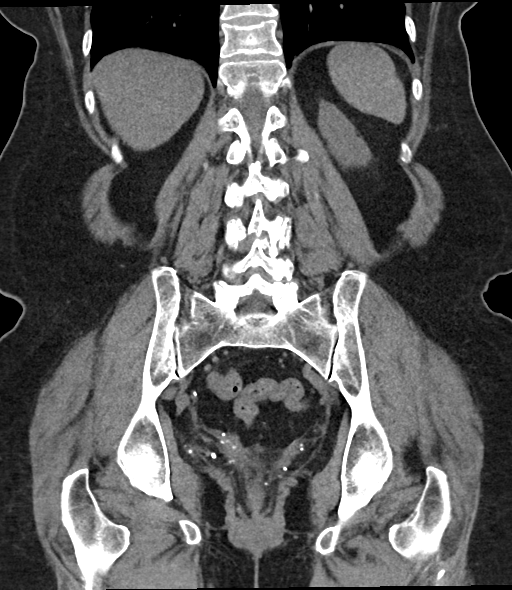
[im 44/99  soft-tissue]
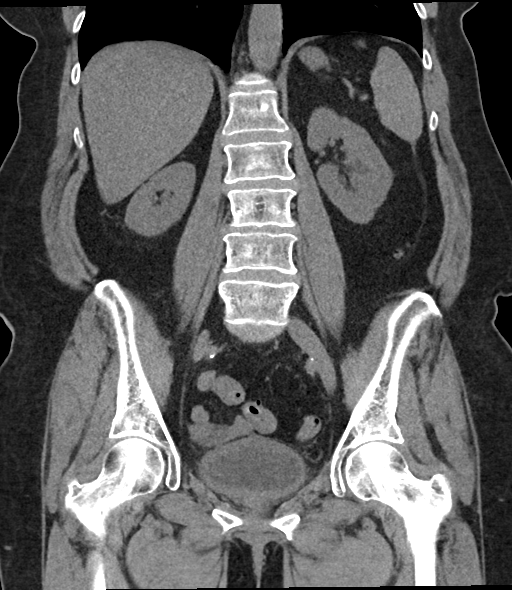
[im 55/99  soft-tissue]
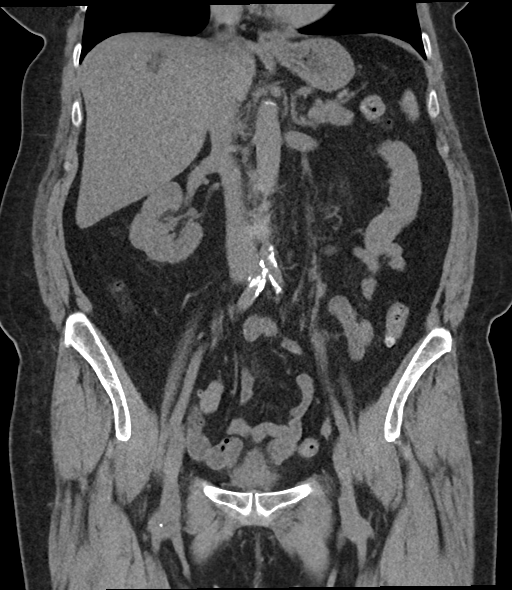

[16 of 46 positions shown; findings below may reference images not displayed]

FINDINGS: Lower chest: Minimal linear scarring at the lung bases. No active
process.

Hepatobiliary: Chronic cysts in the right lobe of the liver. No
calcified gallstones.

Pancreas: Normal

Spleen: Normal

Adrenals/Urinary Tract: Adrenal glands are normal. Kidneys are
normal. No cyst, mass, stone or hydronephrosis. No sign of passing
stone. No stone in the bladder. Numerous phleboliths in the pelvis.

Stomach/Bowel: Stomach and small intestine are normal. Normal
appendix. Moderate amount of fecal matter within the colon.
Diverticulosis without evidence of diverticulitis.

Vascular/Lymphatic: Aortic atherosclerosis. No aneurysm. IVC is
normal. No adenopathy.

Reproductive: Previous hysterectomy.  No pelvic mass.

Other: No free fluid or air.

Musculoskeletal: Lumbar degenerative changes, most prominently facet
arthropathy on the right at L3-4 which could relate to the patient's
symptoms.
IMPRESSION: No evidence of urinary tract stone disease. No abdominal abnormality
seen to explain right sided flank to groin pain.

Aortic atherosclerosis. Chronic benign cysts the right lobe of the
liver. Diverticulosis without evidence of diverticulitis.

Lumbar degenerative changes which could be a cause of back pain or
referred pain. Facet arthropathy on the right at L3-4 in particular
could possibly relate to the current presentation.

## 2023-04-24 ENCOUNTER — Other Ambulatory Visit: Payer: Self-pay | Admitting: Nurse Practitioner

## 2023-04-24 DIAGNOSIS — N644 Mastodynia: Secondary | ICD-10-CM

## 2023-05-01 ENCOUNTER — Other Ambulatory Visit: Payer: Self-pay | Admitting: Obstetrics and Gynecology

## 2023-05-01 DIAGNOSIS — Z13820 Encounter for screening for osteoporosis: Secondary | ICD-10-CM

## 2023-05-15 ENCOUNTER — Ambulatory Visit: Payer: BC Managed Care – PPO

## 2023-05-15 ENCOUNTER — Other Ambulatory Visit: Payer: Self-pay | Admitting: Nurse Practitioner

## 2023-05-15 ENCOUNTER — Ambulatory Visit
Admission: RE | Admit: 2023-05-15 | Discharge: 2023-05-15 | Disposition: A | Discharge status: Home / Self Care | Payer: Medicare Other | Source: Ambulatory Visit | Attending: Nurse Practitioner | Admitting: Nurse Practitioner

## 2023-05-15 DIAGNOSIS — N644 Mastodynia: Secondary | ICD-10-CM

## 2023-07-21 NOTE — Progress Notes (Signed)
 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  FVC 1.62/45%, FEV1 1.12/40%, ratio 0.69, FEF 25-75% 0.69/27% Eos 08/13/21- IgE 44 , Eosinophils 300   NPSG 06/17/22- AHI 2/ hr, desaturation to 84%> O2 1L/ hr for O2 90-93%, body weight 174 lbs -------------------------------------------------------------------------   07/22/22- 68 yo female former smoker(50 pk yrs) followed for allergic rhinitis, COPD mixed type, complicated by embolic stroke RMCA/ ASD closed 2016, Covid infection Jan 2022. Was enrolled in Lung Cancer Screening CT program by Dara Ear- no longer eligible. -Breztri , Singulair , albuterol  HFA, Tessalon , neb Duoneb NPSG 06/17/22- AHI 2/ hr, desaturation to 84%> O2 1L/ hr for O2 90-93%, body weight 174 lbs Body weight today-175 lbs We reviewed and discussed her sleep study showing normal levels of apnea.  We want to recheck oxygen desaturation during sleep.  She still has oxygen at home but rarely uses it.  07/22/23- 68 yo female former smoker(50 pk yrs) followed for allergic rhinitis, COPD mixed type, complicated by embolic stroke RMCA/ ASD closed 2016, Covid infection Jan 2022. Was enrolled in Lung Cancer Screening CT program by Dara Ear- no longer eligible. -Breztri , Singulair , albuterol  HFA, Tessalon , neb Duoneb ONOX 07/31/22  </= 88% for 3hrs 38 minutes Body weight today 139 lbs Discussed the use of AI scribe software for clinical note transcription with the patient, who gave verbal consent to proceed. History of Present Illness   The patient, with a history of pre-diabetes and obesity, presents with a cold, likely contracted from her daughter. She has lost considerable weight, which was intentional  and she is almost at her desired weight. She has been taking Lexapro , which was recently added to her regimen. She previously had oxygen at home due to low oxygen levels at night, but she no longer has it and feels okay without it. She had a sleep study last March which did not show significant sleep apnea. She has not been sick at all this year, except for a bout of COVID and flu last April. She has been managing her allergies with Singulair , which seems to help. She also takes Breztri , which she finds very effective, although it makes her shaky for an hour or two after taking it. She has been losing weight gradually over two or three years, which she attributes to watching her soda and carbohydrate intake and reducing her portion sizes.     Assessment and Plan:    COPD mixed type Nocturnal hypoxemia Likely improved by weight loss -Plan to reassess overnight oximetry at return visit   Allergic Rhinitis Allergic rhinitis with sneezing and rhinorrhea, exacerbated by pollen. Singulair  and Breztri  manage symptoms, though Breztri  causes transient tremors. - Continue Singulair . - Adjust Breztri  to two puffs in the morning and one at night if needed.  Obesity Obesity with significant weight reduction from 193 lbs to 139 lbs due to dietary changes and Mounjaro. Weight loss improved sleep apnea and respiratory symptoms. - Encourage maintenance of current weight. - Consider rechecking sleep study if weight is maintained.  Diabetes Mellitus Diabetes mellitus managed with lifestyle modifications and Mounjaro after progression from pre-diabetes. Significant weight loss achieved through dietary changes.  COVID-19 and Influenza COVID-19 and influenza in April 2023, with vaccinations received. No current symptoms or issues.  ROS-see HPI   + = positive Constitutional:   No-   weight loss, night sweats, fevers, chills, fatigue, lassitude. HEENT:   + headaches, difficulty swallowing,  tooth/dental problems, sore throat,       No-  sneezing, itching, ear ache,  +nasal congestion, post nasal drip,  CV:  No-   chest pain, orthopnea, PND, swelling in lower extremities, anasarca, dizziness, palpitations Resp:   shortness of breath with exertion or at rest.                 productive cough,  No non-productive cough,  No- coughing up of blood.               change in color of mucus.   +wheezing.   Skin: No-   rash or lesions. GI:  No-   heartburn, indigestion, abdominal pain, nausea, vomiting, GU:  MS:  No-   joint pain or swelling.   Neuro-     nothing unusual Psych:  No- change in mood or affect. No depression or anxiety.  No memory loss.  OBJ- Physical Exam   General- Alert, Oriented, Affect-appropriate, Distress- none acute,  Skin- rash-none, lesions- none, excoriation- none Lymphadenopathy- none Head- atraumatic            Eyes- Gross vision intact, PERRLA, conjunctivae and secretions clear            Ears- Hearing, canals-normal            Nose- + turbinate edema, no-Septal dev, mucus, polyps, erosion, perforation             Throat- Mallampati II , mucosa cobblestoned , drainage- none, tonsils- atrophic,            Neck- flexible , trachea midline, no stridor , thyroid  nl, carotid no bruit Chest - symmetrical excursion , unlabored           Heart/CV- RRR , no murmur , no gallop  , no rub, nl s1 s2                           - JVD- none , edema- none, stasis changes- none, varices- none           Lung- + diminished / clear, wheeze- none, cough- none , dullness-none, rub- none           Chest wall-  Abd-  Br/ Gen/ Rectal- Not done, not indicated Extrem- cyanosis- none, clubbing, none, atrophy- none, strength- nl Neuro- grossly intact to observation

## 2023-07-22 ENCOUNTER — Encounter: Payer: Self-pay | Admitting: Internal Medicine

## 2023-07-22 ENCOUNTER — Ambulatory Visit: Payer: BC Managed Care – PPO | Admitting: Internal Medicine

## 2023-07-22 VITALS — BP 118/68 | HR 74 | Temp 97.9°F | Ht 66.0 in | Wt 139.4 lb

## 2023-07-22 DIAGNOSIS — J449 Chronic obstructive pulmonary disease, unspecified: Secondary | ICD-10-CM | POA: Diagnosis not present

## 2023-07-22 DIAGNOSIS — Z87891 Personal history of nicotine dependence: Secondary | ICD-10-CM | POA: Diagnosis not present

## 2023-07-22 DIAGNOSIS — G4734 Idiopathic sleep related nonobstructive alveolar hypoventilation: Secondary | ICD-10-CM | POA: Diagnosis not present

## 2023-07-22 NOTE — Patient Instructions (Signed)
 Ok to continue current meds  When we get you back next year we should probably recheck an overnight oximetry again then at your stable weight

## 2023-08-14 ENCOUNTER — Encounter: Payer: Self-pay | Admitting: Internal Medicine

## 2023-10-27 ENCOUNTER — Ambulatory Visit: Admitting: Podiatry

## 2023-10-27 ENCOUNTER — Ambulatory Visit (INDEPENDENT_AMBULATORY_CARE_PROVIDER_SITE_OTHER)

## 2023-10-27 ENCOUNTER — Encounter: Payer: Self-pay | Admitting: Podiatry

## 2023-10-27 VITALS — Ht 66.0 in | Wt 139.4 lb

## 2023-10-27 DIAGNOSIS — M25572 Pain in left ankle and joints of left foot: Secondary | ICD-10-CM | POA: Diagnosis not present

## 2023-10-27 DIAGNOSIS — S93402A Sprain of unspecified ligament of left ankle, initial encounter: Secondary | ICD-10-CM | POA: Diagnosis not present

## 2023-11-11 NOTE — Progress Notes (Signed)
 Chief Complaint  Patient presents with   Ankle Pain    Pt is here due to left ankle pain states she fell yesterday and her ankle has been hurting since, states the pain is at the top and side of the ankle, pain level at 4-5 has been taking tylenol  for the pain.    HPI: 68 y.o. female presenting today as a new patient for evaluation of left ankle pain.  She sustained a fall yesterday and her ankle has been hurting ever since.  Currently the pain is a 4-5 and she is taking Tylenol  which helps to mitigate her pain  Past Medical History:  Diagnosis Date   Acute maxillary sinusitis 12/08/2017   ADHD (attention deficit hyperactivity disorder)    ALLERGIC RHINITIS    Asthma    ASTHMA 02/27/2009   Qualifier: Diagnosis of  By: Lang CMA, Jessica     Atrial septal defect    prior atrial septal defect stable following closure with amplatzer device   Classic migraine 07/14/2014   Claustrophobia    unable to ride in elavator;leave room door open   COPD (chronic obstructive pulmonary disease) (HCC)    COPD mixed type (HCC) 05/17/2014   PFT 11/02/13- mild obstructive airways disease with insignificant response to bronchodilator    COPD with acute bronchitis (HCC) 03/27/2009   Qualifier: Diagnosis of  By: Neysa MD, Clinton D    CVA (cerebral infarction) 07/14/2014   Depression with anxiety 01/16/2015   Fatigue    of uncertain etiology   Former smoker 03/20/2017   Gait disturbance, 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   History of frequent urinary tract infections    Migraine headache    since childhood   Numbness 07/14/2014   Other headache syndrome 09/12/2014   Overweight    Patent foramen ovale 07/12/2014   PONV (postoperative nausea and vomiting)    Pt states Zofran  does not relieve sx; prefers Phenergan    Post-menopause on HRT (hormone replacement therapy)    Restless leg    Seasonal and perennial allergic rhinitis 02/27/2009   Qualifier: Diagnosis of  By: Lang CMA, Jessica     Stroke [facility])     Stroke occurring within last month 07/12/2014   Upper airway cough syndrome 04/29/2015   Trial of max gerd rx 04/28/2015 >>>       URI 03/13/2009   Qualifier: Diagnosis of  By: Neysa MD, Clinton D     Past Surgical History:  Procedure Laterality Date   ABDOMINAL HYSTERECTOMY     amplatzer septal occluder  07/2014   ANTERIOR CERVICAL DECOMP/DISCECTOMY FUSION  05/06/2012   Procedure: ANTERIOR CERVICAL DECOMPRESSION/DISCECTOMY FUSION 2 LEVELS;  Surgeon: Oneil Rodgers Priestly, MD;  Location: [facility] OR;  Service: Orthopedics;  Laterality: Bilateral;  Anterior cervical decompression fusion, cervical 5-6, cervical 6-7 with instrumentation and allograft.   ASD REPAIR     BLADDER SUSPENSION  2001   cardiac and vascular implants and grafts     CARDIAC CATHETERIZATION N/A 08/08/2014   Procedure: ASD Closure;  Surgeon: Ozell Fell, MD;  Location: [facility] INVASIVE CV LAB;  Service: Cardiovascular;  Laterality: N/A;   PARTIAL HYSTERECTOMY     sinus surgery repair     TEE WITHOUT CARDIOVERSION N/A 07/15/2014   Procedure: TRANSESOPHAGEAL ECHOCARDIOGRAM (TEE);  Surgeon: Jerel Balding, MD;  Location: [facility] ENDOSCOPY;  Service: Cardiovascular;  Laterality: N/A;   TUBAL LIGATION     tubel ligation      Allergies  Allergen Reactions   Penicillins Swelling   Cefuroxime Swelling  Excedrin Tension Headache [Acetaminophen -Caffeine] Swelling   Hydrocodone  Nausea Only   Codeine  Nausea Only   Vicodin [Hydrocodone -Acetaminophen ]     Upset stomach     Physical Exam: General: The patient is alert and oriented x3 in no acute distress.  Dermatology: Skin is warm, dry and supple bilateral lower extremities.   Vascular: Palpable pedal pulses bilaterally. Capillary refill within normal limits.  No appreciable edema.  No erythema.  Neurological: Grossly intact via light touch  Musculoskeletal Exam: Tenderness diffusely throughout the left ankle  Radiographic Exam LT ankle 10/27/2023:  Normal osseous mineralization. Joint  spaces preserved.  No fractures or osseous irregularities noted.  Impression: Negative  Assessment/Plan of Care: 1.  Ankle sprain left.  DOI: 10/26/2023  -Patient evaluated.  X-rays reviewed -RICE -Cam boot dispensed.  WBAT -Recommend OTC Motrin PRN -Return to clinic 4 weeks   Thresa EMERSON Sar, DPM [facility]  Dr. Thresa EMERSON Sar, DPM    [address] [place], [place] [address]                Office [phone]  Fax [phone]

## 2024-07-23 ENCOUNTER — Ambulatory Visit: Admitting: Internal Medicine
